# Patient Record
Sex: Male | Born: 1960 | Race: Black or African American | Hispanic: No | Marital: Single
Health system: Southern US, Community
[De-identification: ages and names within clinical notes are randomized; demographics above are authoritative.]

## PROBLEM LIST (undated history)

## (undated) DIAGNOSIS — C801 Malignant (primary) neoplasm, unspecified: Secondary | ICD-10-CM

## (undated) DIAGNOSIS — E46 Unspecified protein-calorie malnutrition: Secondary | ICD-10-CM

## (undated) DIAGNOSIS — Z5111 Encounter for antineoplastic chemotherapy: Secondary | ICD-10-CM

## (undated) DIAGNOSIS — R509 Fever, unspecified: Secondary | ICD-10-CM

## (undated) DIAGNOSIS — F17200 Nicotine dependence, unspecified, uncomplicated: Secondary | ICD-10-CM

## (undated) DIAGNOSIS — IMO0001 Reserved for inherently not codable concepts without codable children: Secondary | ICD-10-CM

## (undated) DIAGNOSIS — IMO0002 Reserved for concepts with insufficient information to code with codable children: Secondary | ICD-10-CM

## (undated) HISTORY — DX: Reserved for inherently not codable concepts without codable children: IMO0001

## (undated) HISTORY — DX: Nicotine dependence, unspecified, uncomplicated: F17.200

## (undated) HISTORY — DX: Encounter for antineoplastic chemotherapy: Z51.11

## (undated) HISTORY — DX: Reserved for concepts with insufficient information to code with codable children: IMO0002

## (undated) HISTORY — DX: Fever, unspecified: R50.9

## (undated) HISTORY — DX: Unspecified protein-calorie malnutrition: E46

---

## 2003-05-18 ENCOUNTER — Emergency Department (HOSPITAL_COMMUNITY): Admission: EM | Admit: 2003-05-18 | Discharge: 2003-05-18 | Payer: Self-pay | Admitting: Emergency Medicine

## 2005-10-07 ENCOUNTER — Emergency Department (HOSPITAL_COMMUNITY): Admission: EM | Admit: 2005-10-07 | Discharge: 2005-10-07 | Payer: Self-pay | Admitting: Emergency Medicine

## 2015-09-08 ENCOUNTER — Encounter (HOSPITAL_COMMUNITY): Payer: Self-pay | Admitting: *Deleted

## 2015-09-08 ENCOUNTER — Emergency Department (HOSPITAL_COMMUNITY): Payer: BLUE CROSS/BLUE SHIELD

## 2015-09-08 ENCOUNTER — Inpatient Hospital Stay (HOSPITAL_COMMUNITY)
Admission: EM | Admit: 2015-09-08 | Discharge: 2015-09-19 | DRG: 166 | Disposition: A | Discharge status: Home / Self Care | Payer: BLUE CROSS/BLUE SHIELD | Attending: Internal Medicine | Admitting: Internal Medicine

## 2015-09-08 DIAGNOSIS — Z809 Family history of malignant neoplasm, unspecified: Secondary | ICD-10-CM | POA: Diagnosis not present

## 2015-09-08 DIAGNOSIS — Z681 Body mass index (BMI) 19 or less, adult: Secondary | ICD-10-CM | POA: Diagnosis not present

## 2015-09-08 DIAGNOSIS — R05 Cough: Secondary | ICD-10-CM | POA: Diagnosis present

## 2015-09-08 DIAGNOSIS — R918 Other nonspecific abnormal finding of lung field: Secondary | ICD-10-CM | POA: Diagnosis present

## 2015-09-08 DIAGNOSIS — F1010 Alcohol abuse, uncomplicated: Secondary | ICD-10-CM | POA: Diagnosis not present

## 2015-09-08 DIAGNOSIS — J189 Pneumonia, unspecified organism: Secondary | ICD-10-CM | POA: Diagnosis not present

## 2015-09-08 DIAGNOSIS — E222 Syndrome of inappropriate secretion of antidiuretic hormone: Secondary | ICD-10-CM | POA: Diagnosis not present

## 2015-09-08 DIAGNOSIS — E43 Unspecified severe protein-calorie malnutrition: Secondary | ICD-10-CM | POA: Diagnosis not present

## 2015-09-08 DIAGNOSIS — F1410 Cocaine abuse, uncomplicated: Secondary | ICD-10-CM | POA: Diagnosis not present

## 2015-09-08 DIAGNOSIS — E871 Hypo-osmolality and hyponatremia: Secondary | ICD-10-CM | POA: Insufficient documentation

## 2015-09-08 DIAGNOSIS — K029 Dental caries, unspecified: Secondary | ICD-10-CM | POA: Diagnosis not present

## 2015-09-08 DIAGNOSIS — F17200 Nicotine dependence, unspecified, uncomplicated: Secondary | ICD-10-CM | POA: Diagnosis not present

## 2015-09-08 DIAGNOSIS — R509 Fever, unspecified: Secondary | ICD-10-CM

## 2015-09-08 DIAGNOSIS — C3412 Malignant neoplasm of upper lobe, left bronchus or lung: Secondary | ICD-10-CM | POA: Diagnosis not present

## 2015-09-08 DIAGNOSIS — C3490 Malignant neoplasm of unspecified part of unspecified bronchus or lung: Secondary | ICD-10-CM

## 2015-09-08 LAB — BASIC METABOLIC PANEL
Anion gap: 12 (ref 5–15)
BUN: 18 mg/dL (ref 6–20)
CALCIUM: 9.1 mg/dL (ref 8.9–10.3)
CHLORIDE: 95 mmol/L — AB (ref 101–111)
CO2: 26 mmol/L (ref 22–32)
CREATININE: 1.21 mg/dL (ref 0.61–1.24)
GFR calc non Af Amer: >60 mL/min (ref >60)
Glucose, Bld: 103 mg/dL — ABNORMAL HIGH (ref 65–99)
Potassium: 4.4 mmol/L (ref 3.5–5.1)
SODIUM: 133 mmol/L — AB (ref 135–145)

## 2015-09-08 LAB — CBC
HEMATOCRIT: 43.2 % (ref 39.0–52.0)
HEMOGLOBIN: 14.9 g/dL (ref 13.0–17.0)
MCH: 30.6 pg (ref 26.0–34.0)
MCHC: 34.5 g/dL (ref 30.0–36.0)
MCV: 88.7 fL (ref 78.0–100.0)
Platelets: 338 10*3/uL (ref 150–400)
RBC: 4.87 MIL/uL (ref 4.22–5.81)
RDW: 11.9 % (ref 11.5–15.5)
WBC: 6.4 10*3/uL (ref 4.0–10.5)

## 2015-09-08 LAB — I-STAT TROPONIN, ED: TROPONIN I, POC: 0 ng/mL (ref 0.00–0.08)

## 2015-09-08 MED ORDER — IOHEXOL 300 MG/ML  SOLN
75.0000 mL | Freq: Once | INTRAMUSCULAR | Status: AC | PRN
  Administered 2015-09-08: 75 mL via INTRAVENOUS

## 2015-09-08 NOTE — ED Notes (Signed)
Pt reports cough and chest soreness associated with cough for a week. Pt denies fever at home.

## 2015-09-08 NOTE — ED Provider Notes (Signed)
CSN: [id number]     Arrival date & time 09/08/15  1541 History   First MD Initiated Contact with Patient 09/08/15 2141     Chief Complaint  Patient presents with  . Cough  . Chest Pain      HPI Patient presents emergency room with one-month history of cough and chest soreness.  Denies fever or chills.  Has also had a proximally 25 pound weight loss over the last 1-2 months.  Patient is currently an active smoker.  Patient denies any hemoptysis. History reviewed. No pertinent past medical history. History reviewed. No pertinent past surgical history. History reviewed. No pertinent family history. Social History  Substance Use Topics  . Smoking status: Current Some Day Smoker  . Smokeless tobacco: Never Used  . Alcohol Use: Yes    Review of Systems  Constitutional: Positive for unexpected weight change. Negative for fever and chills.  Respiratory: Positive for cough.       Allergies  Review of patient's allergies indicates no known allergies.  Home Medications   Prior to Admission medications   Medication Sig Start Date End Date Taking? Authorizing Provider  aspirin-sod bicarb-citric acid (ALKA-SELTZER) 325 MG TBEF tablet Take 325 mg by mouth every 6 (six) hours as needed. For cold symptoms   Yes Historical Provider, MD  dextromethorphan (DELSYM) 30 MG/5ML liquid Take 30 mg by mouth at bedtime as needed for cough.   Yes Historical Provider, MD   BP 119/82 mmHg  Pulse 93  Temp(Src) 98.9 F (37.2 C) (Oral)  Resp 38  Wt 131 lb 1.6 oz (59.467 kg)  SpO2 98% Physical Exam  Constitutional: He is oriented to person, place, and time. He appears well-developed. No distress.  HENT:  Head: Normocephalic and atraumatic.  Eyes: Pupils are equal, round, and reactive to light.  Neck: Normal range of motion.  Cardiovascular: Normal rate and intact distal pulses.   Pulmonary/Chest: No respiratory distress. He has decreased breath sounds in the left upper field and the left middle  field.  Abdominal: Normal appearance. He exhibits no distension.  Musculoskeletal: Normal range of motion.  Neurological: He is alert and oriented to person, place, and time. No cranial nerve deficit.  Skin: Skin is warm and dry. No rash noted.  Psychiatric: He has a normal mood and affect. His behavior is normal.  Nursing note and vitals reviewed.   ED Course  Procedures (including critical care time) Labs Review Labs Reviewed  BASIC METABOLIC PANEL - Abnormal; Notable for the following:    Sodium 133 (*)    Chloride 95 (*)    Glucose, Bld 103 (*)    All other components within normal limits  CBC  I-STAT TROPOININ, ED    Imaging Review Dg Chest 2 View  09/08/2015  CLINICAL DATA:  55 year old with cough and chest pain for 1 week. Smoker. EXAM: CHEST  2 VIEW COMPARISON:  None. FINDINGS: There is complete left upper lobe collapse with obscuration of the aortic arch and mediastinal shift to the left. The left lower lobe appears clear. The right lung is clear. There is no pleural effusion or pneumothorax. No foreign bodies are seen. The bones appear unremarkable. IMPRESSION: Complete left upper lobe collapse, worrisome for underlying bronchogenic carcinoma in a smoker. Chest CT with contrast recommended for further evaluation. Electronically Signed   By: Richardean Sale M.D.   On: 09/08/2015 17:21   Ct Chest W Contrast  09/09/2015  CLINICAL DATA:  Chronic cough and chest tenderness. Initial encounter. EXAM: CT  CHEST WITH CONTRAST TECHNIQUE: Multidetector CT imaging of the chest was performed during intravenous contrast administration. CONTRAST:  28m OMNIPAQUE IOHEXOL 300 MG/ML  SOLN COMPARISON:  Chest radiograph performed earlier today at 5:23 p.m. FINDINGS: There is complete collapse of the left upper lobe, as previously noted. A lobulated contour is noted extending into the left mainstem bronchus, raising suspicion for an underlying mass measuring approximately 3.2 x 1.5 cm at the left  hilum. This may reflect a bronchogenic or endobronchial lesion. No pleural effusion or pneumothorax is seen. No pulmonary nodules are identified within the expanded portions of both lungs. The mediastinum is normal in size. 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 There appears to be an enlarged 1.4 cm periaortic node. No pericardial effusion is identified. The visualized portions of thyroid gland are unremarkable. No axillary lymphadenopathy is seen. The visualized portions of the liver are grossly unremarkable. The heterogeneous appearance of the spleen is nonspecific, without a dominant mass. The visualized portions of the gallbladder, pancreas and adrenal glands are within normal limits. The visualized portions of the kidneys are unremarkable appearance. No acute osseous abnormalities are identified. IMPRESSION: 1. Suspect mass at the left hilum, extending into the left mainstem bronchus, likely measuring approximately 3.2 x 1.5 cm, with associated complete collapse of the left upper lobe. This may reflect a bronchogenic malignancy, or an endobronchial lesion. 2. Enlarged 1.4 cm periaortic node noted. This is concerning for metastatic disease. 3. 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 Electronically Signed   By: JGarald BaldingM.D.   On: 09/09/2015 00:20   I have personally reviewed and evaluated these images and lab results as part of my medical decision-making.   EKG Interpretation   Date/Time:  Thursday September 08 2015 16:26:09 EST Ventricular Rate:  100 PR Interval:  128 QRS Duration: 94 QT Interval:  346 QTC Calculation: 446 R Axis:   96 Text Interpretation:  Normal sinus rhythm Right atrial enlargement  Rightward axis Left ventricular hypertrophy Abnormal ECG No previous  tracing Confirmed by Xylah Early  MD, [place] ([id number] on 09/08/2015 9:50:01 PM      MDM   Final diagnoses:  Malignant neoplasm of upper lobe of left lung (HCC)        RLeonard Schwartz MD 09/09/15  [phone]

## 2015-09-09 ENCOUNTER — Encounter (HOSPITAL_COMMUNITY): Payer: Self-pay | Admitting: Internal Medicine

## 2015-09-09 ENCOUNTER — Encounter (HOSPITAL_COMMUNITY)
Admission: EM | Disposition: A | Discharge status: Home / Self Care | Payer: Self-pay | Source: Home / Self Care | Attending: Internal Medicine

## 2015-09-09 ENCOUNTER — Observation Stay (HOSPITAL_COMMUNITY): Payer: BLUE CROSS/BLUE SHIELD

## 2015-09-09 DIAGNOSIS — R918 Other nonspecific abnormal finding of lung field: Secondary | ICD-10-CM | POA: Diagnosis present

## 2015-09-09 DIAGNOSIS — C3412 Malignant neoplasm of upper lobe, left bronchus or lung: Secondary | ICD-10-CM | POA: Diagnosis not present

## 2015-09-09 HISTORY — PX: VIDEO BRONCHOSCOPY: SHX5072

## 2015-09-09 LAB — CBC WITH DIFFERENTIAL/PLATELET
Basophils Absolute: 0 10*3/uL (ref 0.0–0.1)
Basophils Relative: 0 %
EOS PCT: 0 %
Eosinophils Absolute: 0 10*3/uL (ref 0.0–0.7)
HEMATOCRIT: 39.8 % (ref 39.0–52.0)
HEMOGLOBIN: 13.6 g/dL (ref 13.0–17.0)
LYMPHS ABS: 1.8 10*3/uL (ref 0.7–4.0)
LYMPHS PCT: 21 %
MCH: 30.6 pg (ref 26.0–34.0)
MCHC: 34.2 g/dL (ref 30.0–36.0)
MCV: 89.4 fL (ref 78.0–100.0)
Monocytes Absolute: 0.9 10*3/uL (ref 0.1–1.0)
Monocytes Relative: 10 %
NEUTROS ABS: 5.9 10*3/uL (ref 1.7–7.7)
NEUTROS PCT: 69 %
Platelets: 313 10*3/uL (ref 150–400)
RBC: 4.45 MIL/uL (ref 4.22–5.81)
RDW: 12 % (ref 11.5–15.5)
WBC: 8.6 10*3/uL (ref 4.0–10.5)

## 2015-09-09 LAB — COMPREHENSIVE METABOLIC PANEL
ALT: 22 U/L (ref 17–63)
AST: 29 U/L (ref 15–41)
Albumin: 2.8 g/dL — ABNORMAL LOW (ref 3.5–5.0)
Alkaline Phosphatase: 100 U/L (ref 38–126)
Anion gap: 11 (ref 5–15)
BUN: 20 mg/dL (ref 6–20)
CHLORIDE: 94 mmol/L — AB (ref 101–111)
CO2: 25 mmol/L (ref 22–32)
Calcium: 8.6 mg/dL — ABNORMAL LOW (ref 8.9–10.3)
Creatinine, Ser: 1.07 mg/dL (ref 0.61–1.24)
GFR calc Af Amer: >60 mL/min (ref >60)
Glucose, Bld: 90 mg/dL (ref 65–99)
POTASSIUM: 4.2 mmol/L (ref 3.5–5.1)
Sodium: 130 mmol/L — ABNORMAL LOW (ref 135–145)
Total Bilirubin: 0.5 mg/dL (ref 0.3–1.2)
Total Protein: 7.6 g/dL (ref 6.5–8.1)

## 2015-09-09 LAB — PROTIME-INR
INR: 1.14 (ref 0.00–1.49)
Prothrombin Time: 14.8 seconds (ref 11.6–15.2)

## 2015-09-09 LAB — APTT: aPTT: 35 seconds (ref 24–37)

## 2015-09-09 SURGERY — VIDEO BRONCHOSCOPY WITHOUT FLUORO
Anesthesia: Moderate Sedation | Laterality: Bilateral

## 2015-09-09 MED ORDER — ENSURE ENLIVE PO LIQD
237.0000 mL | Freq: Two times a day (BID) | ORAL | Status: DC
  Administered 2015-09-11 – 2015-09-12 (×2): 237 mL via ORAL

## 2015-09-09 MED ORDER — ONDANSETRON HCL 4 MG/2ML IJ SOLN: 4.0000 mg | Freq: Four times a day (QID) | INTRAMUSCULAR | Status: DC | PRN

## 2015-09-09 MED ORDER — VITAMIN B-1 100 MG PO TABS
100.0000 mg | ORAL_TABLET | Freq: Every day | ORAL | Status: DC
  Administered 2015-09-11 – 2015-09-18 (×8): 100 mg via ORAL
  Filled 2015-09-09 (×8): qty 1

## 2015-09-09 MED ORDER — ADULT MULTIVITAMIN W/MINERALS CH
1.0000 | ORAL_TABLET | Freq: Every day | ORAL | Status: DC
  Administered 2015-09-11 – 2015-09-18 (×8): 1 via ORAL
  Filled 2015-09-09 (×9): qty 1

## 2015-09-09 MED ORDER — LORAZEPAM 2 MG/ML IJ SOLN: 1.0000 mg | Freq: Four times a day (QID) | INTRAMUSCULAR | Status: AC | PRN

## 2015-09-09 MED ORDER — PHENYLEPHRINE HCL 0.25 % NA SOLN: 1.0000 | Freq: Four times a day (QID) | NASAL | Status: DC | PRN

## 2015-09-09 MED ORDER — SODIUM CHLORIDE 0.9 % IV SOLN
INTRAVENOUS | Status: DC
Start: 2015-09-09 — End: 2015-09-11
  Administered 2015-09-09: 14:00:00 via INTRAVENOUS
  Administered 2015-09-10: 1 mL via INTRAVENOUS

## 2015-09-09 MED ORDER — LIDOCAINE HCL (PF) 1 % IJ SOLN
INTRAMUSCULAR | Status: DC | PRN
  Administered 2015-09-09: 6 mL

## 2015-09-09 MED ORDER — MIDAZOLAM HCL 10 MG/2ML IJ SOLN
INTRAMUSCULAR | Status: DC | PRN
  Administered 2015-09-09: 2 mg via INTRAVENOUS
  Administered 2015-09-09: 1 mg via INTRAVENOUS
  Administered 2015-09-09: 2 mg via INTRAVENOUS

## 2015-09-09 MED ORDER — FENTANYL CITRATE (PF) 100 MCG/2ML IJ SOLN
INTRAMUSCULAR | Status: AC
  Filled 2015-09-09: qty 4

## 2015-09-09 MED ORDER — ONDANSETRON HCL 4 MG PO TABS: 4.0000 mg | ORAL_TABLET | Freq: Four times a day (QID) | ORAL | Status: DC | PRN

## 2015-09-09 MED ORDER — THIAMINE HCL 100 MG/ML IJ SOLN
100.0000 mg | Freq: Every day | INTRAMUSCULAR | Status: DC
  Administered 2015-09-09: 100 mg via INTRAVENOUS
  Filled 2015-09-09 (×2): qty 2

## 2015-09-09 MED ORDER — FENTANYL CITRATE (PF) 100 MCG/2ML IJ SOLN
INTRAMUSCULAR | Status: DC | PRN
  Administered 2015-09-09 (×2): 50 ug via INTRAVENOUS

## 2015-09-09 MED ORDER — LIDOCAINE HCL 2 % EX GEL: 1.0000 "application " | Freq: Once | CUTANEOUS | Status: DC

## 2015-09-09 MED ORDER — MIDAZOLAM HCL 5 MG/ML IJ SOLN
INTRAMUSCULAR | Status: AC
  Filled 2015-09-09: qty 2

## 2015-09-09 MED ORDER — LORAZEPAM 1 MG PO TABS: 1.0000 mg | ORAL_TABLET | Freq: Four times a day (QID) | ORAL | Status: AC | PRN

## 2015-09-09 MED ORDER — PNEUMOCOCCAL VAC POLYVALENT 25 MCG/0.5ML IJ INJ
0.5000 mL | INJECTION | INTRAMUSCULAR | Status: DC
  Filled 2015-09-09: qty 0.5

## 2015-09-09 MED ORDER — ALBUTEROL SULFATE (2.5 MG/3ML) 0.083% IN NEBU: 2.5000 mg | INHALATION_SOLUTION | Freq: Four times a day (QID) | RESPIRATORY_TRACT | Status: AC | PRN

## 2015-09-09 MED ORDER — SODIUM CHLORIDE 0.9 % IV SOLN
INTRAVENOUS | Status: DC
  Administered 2015-09-09: 19:00:00 via INTRAVENOUS

## 2015-09-09 MED ORDER — INFLUENZA VAC SPLIT QUAD 0.5 ML IM SUSY
0.5000 mL | PREFILLED_SYRINGE | INTRAMUSCULAR | Status: DC
  Filled 2015-09-09 (×2): qty 0.5

## 2015-09-09 MED ORDER — ONDANSETRON HCL 4 MG/2ML IJ SOLN: 4.0000 mg | Freq: Three times a day (TID) | INTRAMUSCULAR | Status: AC | PRN

## 2015-09-09 MED ORDER — ACETAMINOPHEN 325 MG PO TABS
650.0000 mg | ORAL_TABLET | Freq: Four times a day (QID) | ORAL | Status: DC | PRN
  Administered 2015-09-11 – 2015-09-14 (×5): 650 mg via ORAL
  Filled 2015-09-09 (×5): qty 2

## 2015-09-09 MED ORDER — LEVALBUTEROL HCL 0.63 MG/3ML IN NEBU
0.6300 mg | INHALATION_SOLUTION | Freq: Four times a day (QID) | RESPIRATORY_TRACT | Status: DC | PRN
  Filled 2015-09-09: qty 3

## 2015-09-09 MED ORDER — ACETAMINOPHEN 650 MG RE SUPP: 650.0000 mg | Freq: Four times a day (QID) | RECTAL | Status: DC | PRN

## 2015-09-09 MED ORDER — FOLIC ACID 1 MG PO TABS
1.0000 mg | ORAL_TABLET | Freq: Every day | ORAL | Status: DC
  Administered 2015-09-11 – 2015-09-18 (×8): 1 mg via ORAL
  Filled 2015-09-09 (×9): qty 1

## 2015-09-09 MED ORDER — BUTAMBEN-TETRACAINE-BENZOCAINE 2-2-14 % EX AERO: 1.0000 | INHALATION_SPRAY | Freq: Once | CUTANEOUS | Status: DC

## 2015-09-09 NOTE — Progress Notes (Signed)
Fax confirmation received for 5 pages of in network pcp for bcbs sent to pt and ED RN Cecille Rubin to fax [phone] Becky to give to pt or ED RN

## 2015-09-09 NOTE — ED Notes (Signed)
Pt. Called his work and notified them that he will not be discharged

## 2015-09-09 NOTE — Progress Notes (Signed)
Video bronchoscopy performed.  Intervention bronchial biopsy.  No complications noted.  Will continue to monitor. 

## 2015-09-09 NOTE — Procedures (Signed)
Bronchoscopy Procedure Note Maurice Mcknight [id number] 1961/03/10  Procedure: Bronchoscopy Indications: 55 yo male with left upper lung endobronchial lesion  Procedure Details Consent: Risks of procedure as well as the alternatives and risks of each were explained to the (patient/caregiver).  Consent for procedure obtained. Time Out: Verified patient identification, verified procedure, site/side was marked, verified correct patient position, special equipment/implants available, medications/allergies/relevent history reviewed, required imaging and test results available.  Performed  He was brought to endoscopy suite.  Given cetacaine spray for topical anesthesia.  Conscious sedation time from 215 pm to 230 pm.  Given 5 mg versed, 100 mcg fentanyl.  Instilled total of 6 ml of 1% lidocaine for topical anesthesia.  Bronchoscope entered orally.  Vocal cords visualized with normal motion.  Bronchoscope entered into trachea and carina visualized.  Entered right main bronchus.  Right upper, middle, and lower lobes visualized.  There were no endobronchial lesions.  Left main bronchus visualized.  There was mass in left main bronchus eminating from left upper lobe bronchus.  Mass had shiny, white appearance.  Performed endobronchial biopsy from lesion x 3.  Minimal bleeding that resolved after washing with saline.    No other immediate complications.  Patient returned to recovery in stable condition.  Disposition Will send endobronchial biopsies for surgical pathology  Chesley Mires, MD [place] 09/09/2015, 2:49 PM Pager:  [phone] After 3pm call: [phone]

## 2015-09-09 NOTE — Progress Notes (Addendum)
ED CM consulted by [facility] ED SW for a [employer] pcp for pt Entered in d/c instructions  0949 CM called [facility] ED spoke with Unit secretary Jacqlyn Larsen and ED RN Margarita Grizzle to attempt to speak with pt   0954 [facility] ED CM noted pt with coverage but no pcp listed Spoke with pt who confirms no pcp [facility] ED CM spoke with pt on how to obtain an in network pcp with insurance coverage via the customer service number or web site  Cm reviewed ED level of care for crisis/emergent services and community pcp level of care to manage continuous or chronic medical concerns.  The pt voiced understanding CM encouraged pt and discussed pt's responsibility to verify with pt's insurance carrier that any recommended medical provider offered by any emergency room or a hospital provider is within the carrier's network. The pt voiced understanding  Pt states his card is in his room CM discussed faxing him a list of doctors to follow up with He agreed to list

## 2015-09-09 NOTE — Progress Notes (Signed)
Progress note  H&P reviewed. I interviewed and examined patient in detail while he was still in the ED waiting for a room. Patient gives history of tobacco, cocaine and alcohol abuse. He presented to the ED because of persistent cough without hemoptysis. He denies dyspnea. States that he has lost 30 pounds over the last several months. Patient was seen eating breakfast this morning. He appeared comfortable and in no distress. Vital signs were stable. Reduced breath sounds on right base.  Case was discussed with PCCM who were at that time to decide inpatient versus outpatient evaluation. Since then pulmonology have consulted and due to concern that if patient goes home, he will not follow up, Hospital management was continued. He underwent fiberoptic bronchoscopy which showed mass in left main bronchus emanating from left upper lobe bronchus. Biopsies were taken.  Vernell Leep, MD, FACP, FHM. [facility] Pager [phone]  If 7PM-7AM, please contact night-coverage [email or web address] Password [email or web address] 09/09/2015, 7:01 PM

## 2015-09-09 NOTE — Consult Note (Signed)
Name: Maurice Mcknight MRN: [id number] DOB: 1961/03/22    ADMISSION DATE:  09/08/2015 CONSULTATION DATE: 2/24  REFERRING MD :  Algis Liming   CHIEF COMPLAINT:  Lung mass   BRIEF PATIENT DESCRIPTION:   55 y.o. male with history of tobacco abuse presents to the ER on 2/23  because of persistent productive cough over the last 1 month. Patient states patient also has been having increasing weight loss estimated 30 lbs in last 6 mo (unintentional). Patient has some chest discomfort only on coughing. CT chest in the ER showed lung mass and since patient has no follow-up patient has been admitted for further management of the lung mass. Denied any hemoptysis. PCCM asked to see to a/w evaluation.     SIGNIFICANT EVENTS    STUDIES:  CT chest 2/24:1. Suspect mass at the left hilum, extending into the left mainstem bronchus, likely measuring approximately 3.2 x 1.5 cm, with associated complete collapse of the left upper lobe. This may reflect a bronchogenic malignancy, or an endobronchial lesion. 2. Enlarged 1.4 cm periaortic node noted. This is concerning for metastatic disease.   HISTORY OF PRESENT ILLNESS:   See above   PAST MEDICAL HISTORY :   has no past medical history on file.  has no past surgical history on file. Prior to Admission medications   Medication Sig Start Date End Date Taking? Authorizing Provider  aspirin-sod bicarb-citric acid (ALKA-SELTZER) 325 MG TBEF tablet Take 325 mg by mouth every 6 (six) hours as needed. For cold symptoms   Yes Historical Provider, MD  dextromethorphan (DELSYM) 30 MG/5ML liquid Take 30 mg by mouth at bedtime as needed for cough.   Yes Historical Provider, MD   No Known Allergies  FAMILY HISTORY:  family history includes Cancer in his sister. SOCIAL HISTORY:  reports that he has been smoking (estimated >1ppd/"years").  He has never used smokeless tobacco. He reports that he drinks alcohol. He reports that he does use illicit drugs; but not for  weeks.  REVIEW OF SYSTEMS:   Constitutional: Negative for fever, chills, weight loss, malaise/fatigue and diaphoresis.  HENT: Negative for hearing loss, ear pain, nosebleeds, congestion, sore throat, neck pain, tinnitus and ear discharge.   Eyes: Negative for blurred vision, double vision, photophobia, pain, discharge and redness.  Respiratory: + cough, hemoptysis, sputum production, shortness of breath, wheezing and stridor. + cp LUL chest w/ cough   Cardiovascular: Negative for chest pain, palpitations, orthopnea, claudication, leg swelling and PND.  Gastrointestinal: Negative for heartburn, nausea, vomiting, abdominal pain, diarrhea, constipation, blood in stool and melena.  Genitourinary: Negative for dysuria, urgency, frequency, hematuria and flank pain.  Musculoskeletal: Negative for myalgias, back pain, joint pain and falls.  Skin: Negative for itching and rash.  Neurological: Negative for dizziness, tingling, tremors, sensory change, speech change, focal weakness, seizures, loss of consciousness, weakness and headaches.  Endo/Heme/Allergies: Negative for environmental allergies and polydipsia. Does not bruise/bleed easily.  SUBJECTIVE:  No distress  VITAL SIGNS: Temp:  [98.9 F (37.2 C)-99 F (37.2 C)] 99 F (37.2 C) (02/24 0346) Pulse Rate:  [74-102] 74 (02/24 0346) Resp:  [17-38] 20 (02/24 0346) BP: (109-140)/(71-93) 109/79 mmHg (02/24 0346) SpO2:  [96 %-99 %] 99 % (02/24 0346) Weight:  [131 lb 1.6 oz (59.467 kg)] 131 lb 1.6 oz (59.467 kg) (02/23 1626)  PHYSICAL EXAMINATION: General:  Lying in bed, no distress.  Neuro:  Awake, alert, no focal def  HEENT:  Temporal wasting. MM are moist, edentulous, no JVD  Cardiovascular:  Rrr, no MRG Lungs:  Ronchi/decreased LUL, otherwise clear, no accessory muscle use  Abdomen:  Soft, not tender, + bowel sounds Musculoskeletal:  Equal st and bulk Skin:  Warm, brisk CR, 2 + pulses    Recent Labs Lab 09/08/15 1634 09/09/15 0403    NA 133* 130*  K 4.4 4.2  CL 95* 94*  CO2 26 25  BUN 18 20  CREATININE 1.21 1.07  GLUCOSE 103* 90    Recent Labs Lab 09/08/15 1634 09/09/15 0403  HGB 14.9 13.6  HCT 43.2 39.8  WBC 6.4 8.6  PLT 338 313   Dg Chest 2 View  09/08/2015  CLINICAL DATA:  55 year old with cough and chest pain for 1 week. Smoker. EXAM: CHEST  2 VIEW COMPARISON:  None. FINDINGS: There is complete left upper lobe collapse with obscuration of the aortic arch and mediastinal shift to the left. The left lower lobe appears clear. The right lung is clear. There is no pleural effusion or pneumothorax. No foreign bodies are seen. The bones appear unremarkable. IMPRESSION: Complete left upper lobe collapse, worrisome for underlying bronchogenic carcinoma in a smoker. Chest CT with contrast recommended for further evaluation. Electronically Signed   By: Richardean Sale M.D.   On: 09/08/2015 17:21   Ct Chest W Contrast  09/09/2015  CLINICAL DATA:  Chronic cough and chest tenderness. Initial encounter. EXAM: CT CHEST WITH CONTRAST TECHNIQUE: Multidetector CT imaging of the chest was performed during intravenous contrast administration. CONTRAST:  89m OMNIPAQUE IOHEXOL 300 MG/ML  SOLN COMPARISON:  Chest radiograph performed earlier today at 5:23 p.m. FINDINGS: There is complete collapse of the left upper lobe, as previously noted. A lobulated contour is noted extending into the left mainstem bronchus, raising suspicion for an underlying mass measuring approximately 3.2 x 1.5 cm at the left hilum. This may reflect a bronchogenic or endobronchial lesion. No pleural effusion or pneumothorax is seen. No pulmonary nodules are identified within the expanded portions of both lungs. The mediastinum is normal in size. 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 There appears to be an enlarged 1.4 cm periaortic node. No pericardial effusion is identified. The visualized portions of thyroid gland are unremarkable. No axillary  lymphadenopathy is seen. The visualized portions of the liver are grossly unremarkable. The heterogeneous appearance of the spleen is nonspecific, without a dominant mass. The visualized portions of the gallbladder, pancreas and adrenal glands are within normal limits. The visualized portions of the kidneys are unremarkable appearance. No acute osseous abnormalities are identified. IMPRESSION: 1. Suspect mass at the left hilum, extending into the left mainstem bronchus, likely measuring approximately 3.2 x 1.5 cm, with associated complete collapse of the left upper lobe. This may reflect a bronchogenic malignancy, or an endobronchial lesion. 2. Enlarged 1.4 cm periaortic node noted. This is concerning for metastatic disease. 3. 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 Electronically Signed   By: JGarald BaldingM.D.   On: 09/09/2015 00:20    ASSESSMENT / PLAN:  LUL Lung mass w/ endobronchial obstruction-->almost certainly a malignancy Post obstructive atelectasis Cough   Plan FOB for bx today   PErick ColaceACNP-BC LFrancisPager # [phone] # [phone] no answer   09/09/2015, 8:37 AM  STAFF NOTE: I, DMerrie Roof MD FACP have personally reviewed patient's available data, including medical history, events of note, physical examination and test results as part of my evaluation. I have discussed with resident/NP and other care providers such as pharmacist, RN and  RRT. In addition, I personally evaluated patient and elicited key findings of: fully examined, no discrete lymph nodes on exam,, no hemoptysis noted, reduced BS left apical, CT reviewed hilar mass, concern if to go home will NOT follow up well, this is amendable to bedside bronch and BX, will plan this afternoon, assess coags, will assess pt need to stay in hospital post procedure, , does not appear to need ebus with location to bronchus, cbc noted, follow for WD  Lavon Paganini. Titus Mould, MD,  [place] Pgr: [facility] 09/09/2015 11:17 AM

## 2015-09-09 NOTE — H&P (Signed)
[facility] History and Physical  Maurice Mcknight DDU:[id number] DOB: 1961/02/11 DOA: 09/08/2015  Referring physician: Dr. Audie Pinto. PCP: No primary care provider on file.  Specialists: None.  Chief Complaint: Cough.  HPI: Maurice Mcknight is a 55 y.o. male with history of tobacco abuse presents to the ER because of persistent productive cough over the last 1 month. Patient states patient also has been having increasing weight loss. Patient has some chest discomfort only on coughing. CT chest in the ER shows lung mass and since patient has no follow-up patient has been admitted for further management of the lung mass. Denies any hemoptysis.   Review of Systems: As presented in the history of presenting illness, rest negative.  History reviewed. No pertinent past medical history. History reviewed. No pertinent past surgical history. Social History:  reports that he has been smoking.  He has never used smokeless tobacco. He reports that he drinks alcohol. He reports that he does not use illicit drugs. Where does patient live home. Can patient participate in ADLs? Yes.  No Known Allergies  Family History:  Family History  Problem Relation Age of Onset  . Cancer Sister       Prior to Admission medications   Medication Sig Start Date End Date Taking? Authorizing Provider  aspirin-sod bicarb-citric acid (ALKA-SELTZER) 325 MG TBEF tablet Take 325 mg by mouth every 6 (six) hours as needed. For cold symptoms   Yes Historical Provider, MD  dextromethorphan (DELSYM) 30 MG/5ML liquid Take 30 mg by mouth at bedtime as needed for cough.   Yes Historical Provider, MD    Physical Exam: Filed Vitals:   09/08/15 2300 09/08/15 2315 09/08/15 2330 09/08/15 2345  BP: 127/93  119/82   Pulse: 92 100 93 93  Temp:      TempSrc:      Resp: 23 35 30 38  Weight:      SpO2: 98% 98% 97% 98%     General:  Moderately built and nourished.  Eyes: Anicteric no pallor.  ENT: No discharge from the  ears eyes nose or mouth.  Neck: No mass felt. No JVD appreciated.  Cardiovascular: S1 and S2 heard.  Respiratory: No rhonchi or crepitations.  Abdomen: Soft nontender bowel sounds present.  Skin: No rash.  Musculoskeletal: No edema.  Psychiatric: Appears normal.  Neurologic: Alert awake oriented to time place and person. Moves all extremities.  Labs on Admission:  Basic Metabolic Panel:  Recent Labs Lab 09/08/15 1634  NA 133*  K 4.4  CL 95*  CO2 26  GLUCOSE 103*  BUN 18  CREATININE 1.21  CALCIUM 9.1   Liver Function Tests: No results for input(s): AST, ALT, ALKPHOS, BILITOT, PROT, ALBUMIN in the last 168 hours. No results for input(s): LIPASE, AMYLASE in the last 168 hours. No results for input(s): AMMONIA in the last 168 hours. CBC:  Recent Labs Lab 09/08/15 1634  WBC 6.4  HGB 14.9  HCT 43.2  MCV 88.7  PLT 338   Cardiac Enzymes: No results for input(s): CKTOTAL, CKMB, CKMBINDEX, TROPONINI in the last 168 hours.  BNP (last 3 results) No results for input(s): BNP in the last 8760 hours.  ProBNP (last 3 results) No results for input(s): PROBNP in the last 8760 hours.  CBG: No results for input(s): GLUCAP in the last 168 hours.  Radiological Exams on Admission: Dg Chest 2 View  09/08/2015  CLINICAL DATA:  55 year old with cough and chest pain for 1 week. Smoker. EXAM: CHEST  2  VIEW COMPARISON:  None. FINDINGS: There is complete left upper lobe collapse with obscuration of the aortic arch and mediastinal shift to the left. The left lower lobe appears clear. The right lung is clear. There is no pleural effusion or pneumothorax. No foreign bodies are seen. The bones appear unremarkable. IMPRESSION: Complete left upper lobe collapse, worrisome for underlying bronchogenic carcinoma in a smoker. Chest CT with contrast recommended for further evaluation. Electronically Signed   By: Richardean Sale M.D.   On: 09/08/2015 17:21   Ct Chest W Contrast  09/09/2015   CLINICAL DATA:  Chronic cough and chest tenderness. Initial encounter. EXAM: CT CHEST WITH CONTRAST TECHNIQUE: Multidetector CT imaging of the chest was performed during intravenous contrast administration. CONTRAST:  14m OMNIPAQUE IOHEXOL 300 MG/ML  SOLN COMPARISON:  Chest radiograph performed earlier today at 5:23 p.m. FINDINGS: There is complete collapse of the left upper lobe, as previously noted. A lobulated contour is noted extending into the left mainstem bronchus, raising suspicion for an underlying mass measuring approximately 3.2 x 1.5 cm at the left hilum. This may reflect a bronchogenic or endobronchial lesion. No pleural effusion or pneumothorax is seen. No pulmonary nodules are identified within the expanded portions of both lungs. The mediastinum is normal in size. 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 There appears to be an enlarged 1.4 cm periaortic node. No pericardial effusion is identified. The visualized portions of thyroid gland are unremarkable. No axillary lymphadenopathy is seen. The visualized portions of the liver are grossly unremarkable. The heterogeneous appearance of the spleen is nonspecific, without a dominant mass. The visualized portions of the gallbladder, pancreas and adrenal glands are within normal limits. The visualized portions of the kidneys are unremarkable appearance. No acute osseous abnormalities are identified. IMPRESSION: 1. Suspect mass at the left hilum, extending into the left mainstem bronchus, likely measuring approximately 3.2 x 1.5 cm, with associated complete collapse of the left upper lobe. This may reflect a bronchogenic malignancy, or an endobronchial lesion. 2. Enlarged 1.4 cm periaortic node noted. This is concerning for metastatic disease. 3. 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 Electronically Signed   By: JGarald BaldingM.D.   On: 09/09/2015 00:20    EKG: Independently reviewed. Sinus tachycardia with  LVH.  Assessment/Plan Principal Problem:   Lung mass   1. Lung mass concerning for malignancy - I have discussed with on-call pulmonary critical care Dr.Deterding, who will be seeing patient in consult for possible bronchoscopy. Further recommendation based on pulmonary consult. 2. Tobacco abuse - patient advised about quitting tobacco.   DVT Prophylaxis SCDs in anticipation of procedure.  Code Status: Full code.  Family Communication: Discussed with patient.  Disposition Plan: Admit for observation.    Blaine Hari N. [facility] Pager [phone]  If 7PM-7AM, please contact night-coverage [email or web address] Password [email or web address]2/24/2017, 12:39 AM

## 2015-09-09 NOTE — ED Notes (Signed)
Patient will be having test done no lunch was ordered

## 2015-09-10 NOTE — Progress Notes (Signed)
Initial Nutrition Assessment  DOCUMENTATION CODES:   Severe malnutrition in context of chronic illness, Underweight  INTERVENTION:   Continue Regular diet  NUTRITION DIAGNOSIS:   Increased nutrient needs related to catabolic illness as evidenced by estimated needs  GOAL:   Patient will meet greater than or equal to 90% of their needs  MONITOR:   PO intake, Supplement acceptance, Labs, Weight trends, I & O's  REASON FOR ASSESSMENT:   Malnutrition Screening Tool  ASSESSMENT:   55 y.o. Male with history of tobacco abuse presents to the ER on 2/23 because of persistent productive cough over the last 1 month. Patient states patient also has been having increasing weight loss estimated 30 lbs in last 6 mo (unintentional). Patient has some chest discomfort only on coughing. CT chest in the ER showed lung mass and since patient has no follow-up patient has been admitted for further management of the lung mass.  Patient reports a decreased appetite PTA. Was consuming about 1 meal per day. Endorses a 30 lb weight loss in the past "few" months. Does not know time frame of wt loss because he "wasn't paying attention to that". PO intake 50% per flowsheet records. Meets criteria for severe malnutrition.  Pt would benefit from oral nutrition supplements, however, declined.  RD unable to complete Nutrition Focused Physical Exam at this time.  Pt covered entirely with blanket.  Diet Order:  Diet regular Room service appropriate?: Yes; Fluid consistency:: Thin  Skin:  Reviewed, no issues  Last BM:  2/22  Height:   Ht Readings from Last 1 Encounters:  09/09/15 '6\' 1"'$  (1.854 m)    Weight:   Wt Readings from Last 1 Encounters:  09/09/15 131 lb (59.421 kg)    Ideal Body Weight:  84 kg  BMI:  Body mass index is 17.29 kg/(m^2).  Estimated Nutritional Needs:   Kcal:  1800-2000  Protein:  90-100 gm  Fluid:  1.8-1.0 L  EDUCATION NEEDS:   No education needs identified at  this time  Arthur Holms, RD, LDN Pager #: [phone] After-Hours Pager #: [phone]

## 2015-09-10 NOTE — Progress Notes (Signed)
Maurice Mcknight [phone] RN

## 2015-09-11 ENCOUNTER — Observation Stay (HOSPITAL_COMMUNITY): Payer: BLUE CROSS/BLUE SHIELD

## 2015-09-11 DIAGNOSIS — E43 Unspecified severe protein-calorie malnutrition: Secondary | ICD-10-CM | POA: Diagnosis not present

## 2015-09-11 DIAGNOSIS — C3412 Malignant neoplasm of upper lobe, left bronchus or lung: Secondary | ICD-10-CM | POA: Diagnosis not present

## 2015-09-11 DIAGNOSIS — J189 Pneumonia, unspecified organism: Secondary | ICD-10-CM | POA: Diagnosis not present

## 2015-09-11 DIAGNOSIS — F1910 Other psychoactive substance abuse, uncomplicated: Secondary | ICD-10-CM | POA: Diagnosis not present

## 2015-09-11 DIAGNOSIS — E871 Hypo-osmolality and hyponatremia: Secondary | ICD-10-CM | POA: Diagnosis not present

## 2015-09-11 DIAGNOSIS — E222 Syndrome of inappropriate secretion of antidiuretic hormone: Secondary | ICD-10-CM | POA: Diagnosis not present

## 2015-09-11 LAB — BASIC METABOLIC PANEL
ANION GAP: 9 (ref 5–15)
BUN: 14 mg/dL (ref 6–20)
CHLORIDE: 96 mmol/L — AB (ref 101–111)
CO2: 27 mmol/L (ref 22–32)
Calcium: 8.4 mg/dL — ABNORMAL LOW (ref 8.9–10.3)
Creatinine, Ser: 1.02 mg/dL (ref 0.61–1.24)
GFR calc non Af Amer: >60 mL/min (ref >60)
Glucose, Bld: 123 mg/dL — ABNORMAL HIGH (ref 65–99)
POTASSIUM: 4.1 mmol/L (ref 3.5–5.1)
SODIUM: 132 mmol/L — AB (ref 135–145)

## 2015-09-11 LAB — URINALYSIS, ROUTINE W REFLEX MICROSCOPIC
BILIRUBIN URINE: NEGATIVE
Glucose, UA: NEGATIVE mg/dL
HGB URINE DIPSTICK: NEGATIVE
KETONES UR: NEGATIVE mg/dL
Leukocytes, UA: NEGATIVE
NITRITE: NEGATIVE
PH: 7.5 (ref 5.0–8.0)
Protein, ur: NEGATIVE mg/dL
SPECIFIC GRAVITY, URINE: 1.023 (ref 1.005–1.030)

## 2015-09-11 LAB — LACTIC ACID, PLASMA: LACTIC ACID, VENOUS: 1.2 mmol/L (ref 0.5–2.0)

## 2015-09-11 LAB — CBC
HCT: 38 % — ABNORMAL LOW (ref 39.0–52.0)
Hemoglobin: 12.9 g/dL — ABNORMAL LOW (ref 13.0–17.0)
MCH: 29.7 pg (ref 26.0–34.0)
MCHC: 33.9 g/dL (ref 30.0–36.0)
MCV: 87.6 fL (ref 78.0–100.0)
PLATELETS: 348 10*3/uL (ref 150–400)
RBC: 4.34 MIL/uL (ref 4.22–5.81)
RDW: 11.7 % (ref 11.5–15.5)
WBC: 6.7 10*3/uL (ref 4.0–10.5)

## 2015-09-11 MED ORDER — GUAIFENESIN-DM 100-10 MG/5ML PO SYRP
5.0000 mL | ORAL_SOLUTION | ORAL | Status: DC | PRN
  Administered 2015-09-11 – 2015-09-19 (×7): 5 mL via ORAL
  Filled 2015-09-11 (×9): qty 5

## 2015-09-11 NOTE — Progress Notes (Signed)
I had seen this patient in the ED on 09/09/15. I had changed the attending name from admitting M.D. to mine. Sometime after that, my name as attending was discontinued and so was [place] rounding team assignment. Patient's nursing team also did not alert me that he had not been seen. Thereby patient got dropped off my list and inadvertently, he was not seen on 09/10/15.  Vernell Leep, MD, FACP, FHM. [facility] Pager [phone]  If 7PM-7AM, please contact night-coverage [email or web address] Password [facility] 09/11/2015, 3:05 PM

## 2015-09-11 NOTE — Progress Notes (Signed)
PROGRESS NOTE    Kiara Keep Dewoody  ZWC:[id number]  DOB: Apr 02, 1961  DOA: 09/08/2015 PCP: No primary care provider on file. Outpatient Specialists:   Hospital course: 55 year old male patient with history of polysubstance abuse-tobacco, cocaine & alcohol, presented to [facility] ED on 09/08/15 with 1 month history of persistent cough productive of white sputum and profound weight loss of 25-30 pounds over several months. In the ED CT chest showed lung mass suspicious for bronchogenic carcinoma. Pulmonology was consulted and patient underwent fiberoptic bronchoscopy that confirmed mass in left main bronchus emanating from left upper lobe bronchus and biopsies were taken. Pathology is pending.   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  - Pulmonology was consulted and patient underwent fiberoptic bronchoscopy that confirmed mass in left main bronchus emanating from left upper lobe bronchus and biopsies were taken.  - Pathology is pending. - Had a temperature of 101.6 early this morning but no clear clinical source of infection. For now continue to monitor. If he has further fevers, will need workup.  Polysubstance abuse: Tobacco, cocaine & alcohol - Cessation counseled. Patient declines nicotine patch. Placed on CIWA protocol. No overt withdrawal.  Severe malnutrition in the context of chronic illness, underweight - Dietitian input appreciated. Continue regular diet and nutritional supplements.  Hyponatremia - Clinically appears euvolemic. May be SIADH related to lung mass. Follow BMP in a.m. DC IV fluids.  DVT prophylaxis: SCDs Code Status: Full Family Communication: None at bedside Disposition Plan: Pending results of pathology results   Consultants:  Pulmonology  Procedures:  Fiberoptic bronchoscopy and biopsy by CCM on 2/24  Antimicrobials:  None   Subjective: Cough with  intermittent white sputum. Denies hemoptysis or chest pain. Does not like hospital food.  Objective: Filed Vitals:   09/10/15 2035 09/11/15 0447 09/11/15 0620 09/11/15 1306  BP: 110/70 110/66  104/76  Pulse: 81 87  79  Temp: 100 F (37.8 C) 101.6 F (38.7 C) 98.5 F (36.9 C) 98.7 F (37.1 C)  TempSrc: Oral Oral Oral Oral  Resp: '19 19  17  '$ Height:      Weight:      SpO2: 100% 100%  100%    Intake/Output Summary (Last 24 hours) at 09/11/15 1508 Last data filed at 09/11/15 1431  Gross per 24 hour  Intake    702 ml  Output      0 ml  Net    702 ml   Filed Weights   09/08/15 1626 09/09/15 1320  Weight: 59.467 kg (131 lb 1.6 oz) 59.421 kg (131 lb)    Exam:  General exam: Moderately built and frail middle-aged male lying comfortably supine in bed. Does not look septic or toxic. Respiratory system: Slightly diminished breath sounds in the left lung fields otherwise clear to auscultation. No increased work of breathing. Cardiovascular system: S1 & S2 heard, RRR. No JVD, murmurs, gallops, clicks or pedal edema. Gastrointestinal system: Abdomen is nondistended, soft and nontender. Normal bowel sounds heard. Central nervous system: Alert and oriented. No focal neurological deficits. Extremities: Symmetric 5 x 5 power.   Data Reviewed: Basic Metabolic Panel:  Recent Labs Lab 09/08/15 1634 09/09/15 0403  NA 133* 130*  K 4.4 4.2  CL 95* 94*  CO2 26 25  GLUCOSE 103* 90  BUN 18 20  CREATININE 1.21 1.07  CALCIUM 9.1 8.6*   Liver Function Tests:  Recent Labs Lab 09/09/15 0403  AST 29  ALT 22  ALKPHOS 100  BILITOT 0.5  PROT 7.6  ALBUMIN 2.8*   No results for input(s): LIPASE, AMYLASE in the last 168 hours. No results for input(s): AMMONIA in the last 168 hours. CBC:  Recent Labs Lab 09/08/15 1634 09/09/15 0403  WBC 6.4 8.6  NEUTROABS  --  5.9  HGB 14.9 13.6  HCT 43.2 39.8  MCV 88.7 89.4  PLT 338 313   Cardiac Enzymes: No results for input(s): CKTOTAL,  CKMB, CKMBINDEX, TROPONINI in the last 168 hours. BNP (last 3 results) No results for input(s): PROBNP in the last 8760 hours. CBG: No results for input(s): GLUCAP in the last 168 hours.  No results found for this or any previous visit (from the past 240 hour(s)).       Studies: No results found.      Scheduled Meds: . feeding supplement (ENSURE ENLIVE)  237 mL Oral BID BM  . folic acid  1 mg Oral Daily  . Influenza vac split quadrivalent PF  0.5 mL Intramuscular Tomorrow-1000  . multivitamin with minerals  1 tablet Oral Daily  . pneumococcal 23 valent vaccine  0.5 mL Intramuscular Tomorrow-1000  . thiamine  100 mg Oral Daily   Or  . thiamine  100 mg Intravenous Daily   Continuous Infusions: . sodium chloride Stopped (09/10/15 1500)    Principal Problem:   Lung mass Active Problems:   Malignant neoplasm of upper lobe of left lung ([place])    Time spent: 25 minutes.    Vernell Leep, MD, FACP, FHM. [facility] Pager [phone]  If 7PM-7AM, please contact night-coverage [email or web address] Password [facility] 09/11/2015, 3:08 PM

## 2015-09-12 ENCOUNTER — Telehealth: Payer: Self-pay | Admitting: *Deleted

## 2015-09-12 ENCOUNTER — Encounter: Payer: Self-pay | Admitting: *Deleted

## 2015-09-12 ENCOUNTER — Encounter (HOSPITAL_COMMUNITY): Payer: Self-pay | Admitting: Pulmonary Disease

## 2015-09-12 DIAGNOSIS — R634 Abnormal weight loss: Secondary | ICD-10-CM | POA: Diagnosis not present

## 2015-09-12 DIAGNOSIS — R0600 Dyspnea, unspecified: Secondary | ICD-10-CM | POA: Diagnosis not present

## 2015-09-12 DIAGNOSIS — F1410 Cocaine abuse, uncomplicated: Secondary | ICD-10-CM | POA: Diagnosis present

## 2015-09-12 DIAGNOSIS — J189 Pneumonia, unspecified organism: Secondary | ICD-10-CM | POA: Diagnosis not present

## 2015-09-12 DIAGNOSIS — C3492 Malignant neoplasm of unspecified part of left bronchus or lung: Secondary | ICD-10-CM

## 2015-09-12 DIAGNOSIS — F17200 Nicotine dependence, unspecified, uncomplicated: Secondary | ICD-10-CM | POA: Diagnosis present

## 2015-09-12 DIAGNOSIS — Z681 Body mass index (BMI) 19 or less, adult: Secondary | ICD-10-CM | POA: Diagnosis not present

## 2015-09-12 DIAGNOSIS — R918 Other nonspecific abnormal finding of lung field: Secondary | ICD-10-CM | POA: Diagnosis not present

## 2015-09-12 DIAGNOSIS — Z809 Family history of malignant neoplasm, unspecified: Secondary | ICD-10-CM | POA: Diagnosis not present

## 2015-09-12 DIAGNOSIS — K029 Dental caries, unspecified: Secondary | ICD-10-CM | POA: Diagnosis present

## 2015-09-12 DIAGNOSIS — C3412 Malignant neoplasm of upper lobe, left bronchus or lung: Secondary | ICD-10-CM

## 2015-09-12 DIAGNOSIS — E871 Hypo-osmolality and hyponatremia: Secondary | ICD-10-CM | POA: Insufficient documentation

## 2015-09-12 DIAGNOSIS — F1010 Alcohol abuse, uncomplicated: Secondary | ICD-10-CM | POA: Diagnosis present

## 2015-09-12 DIAGNOSIS — E43 Unspecified severe protein-calorie malnutrition: Secondary | ICD-10-CM | POA: Diagnosis present

## 2015-09-12 DIAGNOSIS — R05 Cough: Secondary | ICD-10-CM | POA: Diagnosis present

## 2015-09-12 DIAGNOSIS — E222 Syndrome of inappropriate secretion of antidiuretic hormone: Secondary | ICD-10-CM | POA: Diagnosis not present

## 2015-09-12 LAB — BASIC METABOLIC PANEL
Anion gap: 8 (ref 5–15)
BUN: 12 mg/dL (ref 6–20)
CALCIUM: 8.1 mg/dL — AB (ref 8.9–10.3)
CO2: 26 mmol/L (ref 22–32)
CREATININE: 0.98 mg/dL (ref 0.61–1.24)
Chloride: 98 mmol/L — ABNORMAL LOW (ref 101–111)
GFR calc Af Amer: >60 mL/min (ref >60)
GLUCOSE: 97 mg/dL (ref 65–99)
Potassium: 3.8 mmol/L (ref 3.5–5.1)
SODIUM: 132 mmol/L — AB (ref 135–145)

## 2015-09-12 LAB — LACTIC ACID, PLASMA: LACTIC ACID, VENOUS: 0.9 mmol/L (ref 0.5–2.0)

## 2015-09-12 LAB — INFLUENZA PANEL BY PCR (TYPE A & B)
H1N1 flu by pcr: NOT DETECTED
INFLBPCR: NEGATIVE
Influenza A By PCR: NEGATIVE

## 2015-09-12 LAB — URINE CULTURE

## 2015-09-12 LAB — OSMOLALITY, URINE: Osmolality, Ur: 863 mOsm/kg (ref 300–900)

## 2015-09-12 LAB — OSMOLALITY: Osmolality: 276 mOsm/kg (ref 275–295)

## 2015-09-12 MED ORDER — PIPERACILLIN-TAZOBACTAM 3.375 G IVPB
3.3750 g | Freq: Three times a day (TID) | INTRAVENOUS | Status: DC
  Administered 2015-09-12 – 2015-09-18 (×17): 3.375 g via INTRAVENOUS
  Filled 2015-09-12 (×21): qty 50

## 2015-09-12 MED ORDER — VANCOMYCIN HCL IN DEXTROSE 750-5 MG/150ML-% IV SOLN
750.0000 mg | Freq: Two times a day (BID) | INTRAVENOUS | Status: DC
  Administered 2015-09-12 – 2015-09-15 (×7): 750 mg via INTRAVENOUS
  Filled 2015-09-12 (×9): qty 150

## 2015-09-12 MED ORDER — PIPERACILLIN-TAZOBACTAM 3.375 G IVPB 30 MIN
3.3750 g | Freq: Once | INTRAVENOUS | Status: AC
  Administered 2015-09-12: 3.375 g via INTRAVENOUS
  Filled 2015-09-12: qty 50

## 2015-09-12 NOTE — Progress Notes (Signed)
Pharmacy Antibiotic Note  Maurice Mcknight is a 55 y.o. male admitted on 09/08/2015 with post-obstructive PNA.  Pharmacy has been consulted for Vancocin and Zosyn dosing.  Plan: Vancomycin 750 IV every 12 hours.  Goal trough 15-20 mcg/mL. Zosyn 3.375g IV q8h (4 hour infusion).  Height: '6\' 1"'$  (185.4 cm) Weight: 131 lb (59.421 kg) IBW/kg (Calculated) : 79.9  Temp (24hrs), Avg:100.2 F (37.9 C), Min:98.7 F (37.1 C), Max:102.8 F (39.3 C)   Recent Labs Lab 09/08/15 1634 09/09/15 0403 09/11/15 2134 09/11/15 2149 09/12/15 0007 09/12/15 0536  WBC 6.4 8.6  --  6.7  --   --   CREATININE 1.21 1.07  --  1.02  --  0.98  LATICACIDVEN  --   --  1.2  --  0.9  --     Estimated Creatinine Clearance: 70.7 mL/min (by C-G formula based on Cr of 0.98).    No Known Allergies   Microbiology results: 2/26 BCx IP 2/26 UCx IP   Thank you for allowing pharmacy to be a part of this patient's care.  Wynona Neat, PharmD, BCPS  09/12/2015 7:41 AM

## 2015-09-12 NOTE — Progress Notes (Signed)
Oncology Nurse Navigator Documentation  Oncology Nurse Navigator Flowsheets 09/12/2015  Treatment Phase Abnormal Scans/I received a referral on Mr. Burgoon today.  Will schedule for MTOC once discharged   Barriers/Navigation Needs Coordination of Care  Interventions Coordination of Care  Coordination of Care Appts  Acuity Level 1  Time Spent with Patient 15

## 2015-09-12 NOTE — Progress Notes (Signed)
Patient oral temp=103.2 and refused Tylenol or any medication including the Vancomycin IVPB. Patient stated, "I refused any treatment until I talked to my doctor tomorrow".  Patient's refusal for any treatment was documented earlier and floor coverage made aware.

## 2015-09-12 NOTE — Progress Notes (Signed)
Floor coverage K. Baltazar Najjar called back instructing this RN to document refusal of treatment and will just wait for tomorrow for patient's doctor to talk to him.  Patient just refused to go to MRI until patient's talk to his doctor tomorrow.

## 2015-09-12 NOTE — Progress Notes (Signed)
Pt. Very angry knowing of newly diagnosed left lung cancer thru phone from Dr. Algis Liming.  Pulled out the hanging Vanco IVPB and refused tx from anybody until he talks to the doctor.  [facility] Floor coverage Tylene Fantasia paged for info and further disposition.  Will monitor.  Charge RN made aware.  Awaiting response from floor coverage [facility].

## 2015-09-12 NOTE — Progress Notes (Signed)
Oncology Nurse Navigator Documentation  Oncology Nurse Navigator Flowsheets 09/12/2015  Treatment Phase Abnormal Scans/[name] received a call from attending regarding referral on Mr. Waybright.  [name] stated he would see patient on 09/16/15 arrive at 9:00.  Attending will update patient.    Barriers/Navigation Needs Coordination of Care  Interventions -  Coordination of Care Appts  Acuity Level 1  Time Spent with Patient 15

## 2015-09-12 NOTE — Progress Notes (Signed)
PROGRESS NOTE    Maurice Mcknight  GNF:[id number]  DOB: Nov 16, 1960  DOA: 09/08/2015 PCP: No primary care provider on file. Outpatient Specialists:   Hospital course: 55 year old male patient with history of polysubstance abuse-tobacco, cocaine & alcohol, presented to [facility] ED on 09/08/15 with 1 month history of persistent cough productive of white sputum and profound weight loss of 25-30 pounds over several months. In the ED CT chest showed lung mass suspicious for bronchogenic carcinoma. Pulmonology was consulted and patient underwent fiberoptic bronchoscopy that confirmed mass in left main bronchus emanating from left upper lobe bronchus and biopsies were taken. Pathology shows squamous cell carcinoma.   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  - Pulmonology was consulted and patient underwent fiberoptic bronchoscopy that confirmed mass in left main bronchus emanating from left upper lobe bronchus and biopsies were taken.  - Pathology confirms squamous cell carcinoma - Discussed with Dr. Elsworth Soho, Pulmonology: arranging [facility] referral. Will need OP PET scan - 1.4 cm periaortic LN noted on CT - Discussed with Dr. Curt Bears, Oncologist on call who has arranged outpatient follow-up on Friday 09/16/15-patient has to come at 9 AM for labs and 9:15 AM for M.D. visit. Patient states that he has no transport to get their-case management consulted for resources. [name] also recommended MRI brain with and without contrast-will order  Postobstructive pneumonia - Patient spiking temperatures for the last 2 days. - Started empiric IV vancomycin and Zosyn pending culture results. - Influenza panel PCR negative. Urine microscopy: Negative. Chest x-ray shows left upper lobe collapse, postobstructive pneumonia - Hopefully can transition to oral antibiotics in the next 24-48  hours  Polysubstance abuse: Tobacco, cocaine & alcohol - Cessation counseled. Patient declines nicotine patch. Placed on CIWA protocol. No overt withdrawal.  Severe malnutrition in the context of chronic illness, underweight - Dietitian input appreciated. Continue regular diet and nutritional supplements.  Hyponatremia - Clinically appears euvolemic. May be SIADH related to lung mass. Follow BMP in a.m. DC IV fluids. Stable.  Dental caries - Outpatient follow-up.  DVT prophylaxis: SCDs Code Status: Full Family Communication: None at bedside Disposition Plan: Hopefully in the next 48 hours.   Consultants:  Pulmonology  Procedures:  Fiberoptic bronchoscopy and biopsy by CCM on 2/24  Antimicrobials:  IV Zosyn 2/27 >  IV vancomycin 2/27 >  Pathology results: Diagnosis Endobronchial biopsy, left mainstem bronchus POSITIVE FOR SQUAMOUS CELL CARCINOMA.  Subjective: Cough with intermittent white sputum. Fevers overnight.  Objective: Filed Vitals:   09/11/15 2045 09/11/15 2144 09/11/15 2300 09/12/15 0417  BP: 101/75 135/73  105/66  Pulse: 79 84 82 78  Temp: 102.8 F (39.3 C) 99.4 F (37.4 C)  99.8 F (37.7 C)  TempSrc: Oral Oral  Oral  Resp: '19 19  19  '$ Height:      Weight:      SpO2: 100% 100%  100%    Intake/Output Summary (Last 24 hours) at 09/12/15 0739 Last data filed at 09/11/15 1900  Gross per 24 hour  Intake    684 ml  Output      0 ml  Net    684 ml   Filed Weights   09/08/15 1626 09/09/15 1320  Weight: 59.467 kg (131 lb 1.6 oz) 59.421 kg (131 lb)    Exam:  General exam: Moderately built and frail middle-aged male lying comfortably supine in bed. Does not look  septic or toxic. Respiratory system: Slightly diminished breath sounds in the left lung fields otherwise clear to auscultation. No increased work of breathing. Cardiovascular system: S1 & S2 heard, RRR. No JVD, murmurs, gallops, clicks or pedal edema. Gastrointestinal system: Abdomen is  nondistended, soft and nontender. Normal bowel sounds heard. Central nervous system: Alert and oriented. No focal neurological deficits. Extremities: Symmetric 5 x 5 power.   Data Reviewed: Basic Metabolic Panel:  Recent Labs Lab 09/08/15 1634 09/09/15 0403 09/11/15 2149 09/12/15 0536  NA 133* 130* 132* 132*  K 4.4 4.2 4.1 3.8  CL 95* 94* 96* 98*  CO2 '26 25 27 26  '$ GLUCOSE 103* 90 123* 97  BUN '18 20 14 12  '$ CREATININE 1.21 1.07 1.02 0.98  CALCIUM 9.1 8.6* 8.4* 8.1*   Liver Function Tests:  Recent Labs Lab 09/09/15 0403  AST 29  ALT 22  ALKPHOS 100  BILITOT 0.5  PROT 7.6  ALBUMIN 2.8*   No results for input(s): LIPASE, AMYLASE in the last 168 hours. No results for input(s): AMMONIA in the last 168 hours. CBC:  Recent Labs Lab 09/08/15 1634 09/09/15 0403 09/11/15 2149  WBC 6.4 8.6 6.7  NEUTROABS  --  5.9  --   HGB 14.9 13.6 12.9*  HCT 43.2 39.8 38.0*  MCV 88.7 89.4 87.6  PLT 338 313 348   Cardiac Enzymes: No results for input(s): CKTOTAL, CKMB, CKMBINDEX, TROPONINI in the last 168 hours. BNP (last 3 results) No results for input(s): PROBNP in the last 8760 hours. CBG: No results for input(s): GLUCAP in the last 168 hours.  No results found for this or any previous visit (from the past 240 hour(s)).       Studies: Dg Chest Port 1 View  09/12/2015  CLINICAL DATA:  Acute onset of fever and shortness of breath. Initial encounter. EXAM: PORTABLE CHEST 1 VIEW COMPARISON:  Chest radiograph and CT of the chest performed 09/08/2015 FINDINGS: Left upper lobe collapse is again noted, somewhat more dense than on the prior study. As before, this appeared to reflect a mass at the left hilum on the prior CT, with associated postobstructive pneumonia. No pleural effusion or pneumothorax is seen. The cardiomediastinal silhouette is within normal limits. No acute osseous abnormalities are seen. IMPRESSION: Left upper lobe collapse again noted. This reflects the apparent  mass at the left hilum as noted on recent prior CT, with associated postobstructive pneumonia. Electronically Signed   By: Garald Balding M.D.   On: 09/12/2015 01:46        Scheduled Meds: . feeding supplement (ENSURE ENLIVE)  237 mL Oral BID BM  . folic acid  1 mg Oral Daily  . Influenza vac split quadrivalent PF  0.5 mL Intramuscular Tomorrow-1000  . multivitamin with minerals  1 tablet Oral Daily  . pneumococcal 23 valent vaccine  0.5 mL Intramuscular Tomorrow-1000  . thiamine  100 mg Oral Daily   Or  . thiamine  100 mg Intravenous Daily   Continuous Infusions:    Principal Problem:   Lung mass Active Problems:   Malignant neoplasm of upper lobe of left lung ([place])    Time spent: 35 minutes.    Vernell Leep, MD, FACP, FHM. [facility] Pager [phone]  If 7PM-7AM, please contact night-coverage [email or web address] Password [facility] 09/12/2015, 7:39 AM

## 2015-09-12 NOTE — Progress Notes (Signed)
Name: Maurice Mcknight MRN: [id number] DOB: Nov 29, 1960    ADMISSION DATE:  09/08/2015 CONSULTATION DATE: 2/24  REFERRING MD :  Algis Liming   CHIEF COMPLAINT:  Lung mass   BRIEF PATIENT DESCRIPTION:   55 y.o. male with history of tobacco abuse presents to the ER on 2/23  because of persistent productive cough over the last 1 month. Patient states patient also has been having increasing weight loss estimated 30 lbs in last 6 mo (unintentional). Patient has some chest discomfort only on coughing. CT chest in the ER showed lung mass and since patient has no follow-up patient has been admitted for further management of the lung mass. Denied any hemoptysis. PCCM asked to see to a/w evaluation.     SIGNIFICANT EVENTS  2/24 bscopy >>mass in left main bronchus eminating from left upper lobe bronchus. Mass had shiny, white appearance.  STUDIES:  CT chest 2/24:1. Suspect mass at the left hilum, extending into the left mainstem bronchus, likely measuring approximately 3.2 x 1.5 cm, with associated complete collapse of the left upper lobe. This may reflect a bronchogenic malignancy, or an endobronchial lesion. 2. Enlarged 1.4 cm periaortic node noted. This is concerning for metastatic disease.    SUBJECTIVE:  No distress  No CP, dyspnea  VITAL SIGNS: Temp:  [98.7 F (37.1 C)-102.8 F (39.3 C)] 99.8 F (37.7 C) (02/27 0417) Pulse Rate:  [78-84] 78 (02/27 0417) Resp:  [17-19] 19 (02/27 0417) BP: (101-135)/(66-76) 105/66 mmHg (02/27 0417) SpO2:  [100 %] 100 % (02/27 0417)  PHYSICAL EXAMINATION: General:  Lying in bed, no distress.  Neuro:  Awake, alert, no focal def  HEENT:  Temporal wasting. MM are moist, edentulous, no JVD  Cardiovascular:  Rrr, no MRG Lungs:  Ronchi/decreased LUL, otherwise clear, no accessory muscle use  Abdomen:  Soft, not tender, + bowel sounds Musculoskeletal:  Equal st and bulk Skin:  Warm, brisk CR, 2 + pulses    Recent Labs Lab 09/09/15 0403  09/11/15 2149 09/12/15 0536  NA 130* 132* 132*  K 4.2 4.1 3.8  CL 94* 96* 98*  CO2 '25 27 26  '$ BUN '20 14 12  '$ CREATININE 1.07 1.02 0.98  GLUCOSE 90 123* 97    Recent Labs Lab 09/08/15 1634 09/09/15 0403 09/11/15 2149  HGB 14.9 13.6 12.9*  HCT 43.2 39.8 38.0*  WBC 6.4 8.6 6.7  PLT 338 313 348   Dg Chest Port 1 View  09/12/2015  CLINICAL DATA:  Acute onset of fever and shortness of breath. Initial encounter. EXAM: PORTABLE CHEST 1 VIEW COMPARISON:  Chest radiograph and CT of the chest performed 09/08/2015 FINDINGS: Left upper lobe collapse is again noted, somewhat more dense than on the prior study. As before, this appeared to reflect a mass at the left hilum on the prior CT, with associated postobstructive pneumonia. No pleural effusion or pneumothorax is seen. The cardiomediastinal silhouette is within normal limits. No acute osseous abnormalities are seen. IMPRESSION: Left upper lobe collapse again noted. This reflects the apparent mass at the left hilum as noted on recent prior CT, with associated postobstructive pneumonia. Electronically Signed   By: Garald Balding M.D.   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 FCCP. [facility] Pager [phone] If no response call [phone]    09/12/2015 1:05 PM

## 2015-09-13 ENCOUNTER — Inpatient Hospital Stay (HOSPITAL_COMMUNITY): Payer: BLUE CROSS/BLUE SHIELD

## 2015-09-13 DIAGNOSIS — E222 Syndrome of inappropriate secretion of antidiuretic hormone: Secondary | ICD-10-CM

## 2015-09-13 MED ORDER — GADOBENATE DIMEGLUMINE 529 MG/ML IV SOLN
12.0000 mL | Freq: Once | INTRAVENOUS | Status: AC | PRN
  Administered 2015-09-13: 12 mL via INTRAVENOUS

## 2015-09-13 NOTE — Progress Notes (Signed)
Addendum  Overnight events noted. Patient was angry because his cancer diagnosis was allegedly informed to him via phone call & not in person. I interviewed and examined patient today along with 6N floor Soil scientist. I discussed extensively with patient and advised him that I first told him about his cancer diagnosis on Sun 09/11/15, based upon FOB results at which time he had stated " don't say that, don't say that" and did not wish to discuss. As per my discussion with Dr. Elsworth Soho, [place], he discussed patient's cancer diagnosis with patient in person on 09/12/15. Following this I discussed extensively via phone with patient regarding confirmed cancer diagnosis and further steps i.e. outpatient oncology follow-up appointment made, outpatient workup including PET scan, inpatient MRI brain as recommended by oncologist, case management consultation to assist with outpatient resources for him to go to [facility] etc. Patient became verbally agitated and abusive (using the F... word) and kept repeating that he was not informed of his cancer diagnosis in person which was not true. Summarized his hospitalization, workup done thus far, management and future plans. He kept saying, I want this "taken out" indicating his cancer. Repeatedly advised him that there were no plans to perform any form of surgery in the hospital at this time. He is undergoing workup to determine if he will be a surgical candidate at a future point. He seemed agreeable to getting MRI brain.  Vernell Leep, MD, FACP, FHM. [facility] Pager [phone]  If 7PM-7AM, please contact night-coverage [email or web address] Password [facility] 09/13/2015, 5:04 PM

## 2015-09-13 NOTE — Progress Notes (Signed)
PROGRESS NOTE    Decari Duggar Stofko  LEX:[id number]  DOB: [birth year]  DOA: 09/08/2015 PCP: No primary care provider on file. Outpatient Specialists:   Hospital course: 55 year old male patient with history of polysubstance abuse-tobacco, cocaine & alcohol, presented to [facility] ED on 09/08/15 with 1 month history of persistent cough productive of white sputum and profound weight loss of 25-30 pounds over several months. In the ED CT chest showed lung mass suspicious for bronchogenic carcinoma. Pulmonology was consulted and patient underwent fiberoptic bronchoscopy that confirmed mass in left main bronchus emanating from left upper lobe bronchus and biopsies were taken. Pathology shows squamous cell carcinoma.   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  - Pulmonology was consulted and patient underwent fiberoptic bronchoscopy that confirmed mass in left main bronchus emanating from left upper lobe bronchus and biopsies were taken.  - Pathology confirms squamous cell carcinoma - Discussed with Dr. Elsworth Soho, Pulmonology: arranging [place] referral. Will need OP PET scan - 1.4 cm periaortic LN noted on CT - Discussed with Dr. Curt Bears, Oncologist on call on 2/27 who has arranged outpatient follow-up on Friday 09/16/15-patient has to come at 9 AM for labs and 9:15 AM for M.D. visit. Patient states that he has no transport to get their-case management consulted for resources. [name] also recommended MRI brain with and without contrast - ordered. If for some reason, patient's stay in the hospital is extended and he cannot make it to the oncologist appointment, this has to be communicated with the oncologist and date has to be changed/postponed.  Postobstructive pneumonia - Patient spiking temperatures - Started empiric IV vancomycin and Zosyn pending culture results. -  Influenza panel PCR negative. Urine microscopy: Negative. Chest x-ray shows left upper lobe collapse, postobstructive pneumonia - Hopefully can transition to oral antibiotics in the next 24 hours  Polysubstance abuse: Tobacco, cocaine & alcohol - Cessation counseled. Patient declines nicotine patch. Placed on CIWA protocol. No overt withdrawal.  Severe malnutrition in the context of chronic illness, underweight - Dietitian input appreciated. Continue regular diet and nutritional supplements.  Hyponatremia/SIADH - Clinically appears euvolemic.  - Urine osmolarity 863, serum osmolarity 276. Fluid restriction.  Dental caries - Outpatient follow-up.  DVT prophylaxis: SCDs Code Status: Full Family Communication: None at bedside Disposition Plan: Hopefully DC home in the next 24 hours and outpatient follow-up with oncology.   Consultants:  Pulmonology  Procedures:  Fiberoptic bronchoscopy and biopsy by CCM on 2/24  Antimicrobials:  IV Zosyn 2/27 >  IV vancomycin 2/27 >  Pathology results: Diagnosis Endobronchial biopsy, left mainstem bronchus POSITIVE FOR SQUAMOUS CELL CARCINOMA.  Subjective: No new complaints reported. Had fever overnight.  Objective: Filed Vitals:   09/12/15 0417 09/12/15 1511 09/12/15 2237 09/13/15 0519  BP: 105/66 114/82 105/68 95/78  Pulse: 78 88 99 100  Temp: 99.8 F (37.7 C) 99.9 F (37.7 C) 103.2 F (39.6 C) 100.9 F (38.3 C)  TempSrc: Oral Oral Oral Oral  Resp: '19 17 18 18  '$ Height:      Weight:      SpO2: 100% 100% 100% 95%    Intake/Output Summary (Last 24 hours) at 09/13/15 1646 Last data filed at 09/13/15 0840  Gross per 24 hour  Intake    530 ml  Output    400 ml  Net    130 ml   Filed Weights   09/08/15 1626  09/09/15 1320  Weight: 59.467 kg (131 lb 1.6 oz) 59.421 kg (131 lb)    Exam:  General exam: Moderately built and frail middle-aged male lying comfortably supine in bed. Does not look septic or toxic. Respiratory  system: Slightly diminished breath sounds in the left lung fields otherwise clear to auscultation. No increased work of breathing. Cardiovascular system: S1 & S2 heard, RRR. No JVD, murmurs, gallops, clicks or pedal edema. Gastrointestinal system: Abdomen is nondistended, soft and nontender. Normal bowel sounds heard. Central nervous system: Alert and oriented. No focal neurological deficits. Extremities: Symmetric 5 x 5 power.   Data Reviewed: Basic Metabolic Panel:  Recent Labs Lab 09/08/15 1634 09/09/15 0403 09/11/15 2149 09/12/15 0536  NA 133* 130* 132* 132*  K 4.4 4.2 4.1 3.8  CL 95* 94* 96* 98*  CO2 '26 25 27 26  '$ GLUCOSE 103* 90 123* 97  BUN '18 20 14 12  '$ CREATININE 1.21 1.07 1.02 0.98  CALCIUM 9.1 8.6* 8.4* 8.1*   Liver Function Tests:  Recent Labs Lab 09/09/15 0403  AST 29  ALT 22  ALKPHOS 100  BILITOT 0.5  PROT 7.6  ALBUMIN 2.8*   No results for input(s): LIPASE, AMYLASE in the last 168 hours. No results for input(s): AMMONIA in the last 168 hours. CBC:  Recent Labs Lab 09/08/15 1634 09/09/15 0403 09/11/15 2149  WBC 6.4 8.6 6.7  NEUTROABS  --  5.9  --   HGB 14.9 13.6 12.9*  HCT 43.2 39.8 38.0*  MCV 88.7 89.4 87.6  PLT 338 313 348   Cardiac Enzymes: No results for input(s): CKTOTAL, CKMB, CKMBINDEX, TROPONINI in the last 168 hours. BNP (last 3 results) No results for input(s): PROBNP in the last 8760 hours. CBG: No results for input(s): GLUCAP in the last 168 hours.  Recent Results (from the past 240 hour(s))  Culture, blood (routine x 2)     Status: None (Preliminary result)   Collection Time: 09/11/15  9:35 PM  Result Value Ref Range Status   Specimen Description BLOOD LEFT ANTECUBITAL  Final   Special Requests BOTTLES DRAWN AEROBIC AND ANAEROBIC 8CC   Final   Culture NO GROWTH 2 DAYS  Final   Report Status PENDING  Incomplete  Culture, blood (routine x 2)     Status: None (Preliminary result)   Collection Time: 09/11/15  9:40 PM  Result  Value Ref Range Status   Specimen Description BLOOD RIGHT ANTECUBITAL  Final   Special Requests BOTTLES DRAWN AEROBIC AND ANAEROBIC 10CC   Final   Culture NO GROWTH 2 DAYS  Final   Report Status PENDING  Incomplete  Culture, Urine     Status: None   Collection Time: 09/11/15 10:54 PM  Result Value Ref Range Status   Specimen Description URINE, RANDOM  Final   Special Requests NONE  Final   Culture MULTIPLE SPECIES PRESENT, SUGGEST RECOLLECTION  Final   Report Status 09/12/2015 FINAL  Final         Studies: Dg Chest Port 1 View  09/12/2015  CLINICAL DATA:  Acute onset of fever and shortness of breath. Initial encounter. EXAM: PORTABLE CHEST 1 VIEW COMPARISON:  Chest radiograph and CT of the chest performed 09/08/2015 FINDINGS: Left upper lobe collapse is again noted, somewhat more dense than on the prior study. As before, this appeared to reflect a mass at the left hilum on the prior CT, with associated postobstructive pneumonia. No pleural effusion or pneumothorax is seen. The cardiomediastinal silhouette is within normal limits.  No acute osseous abnormalities are seen. IMPRESSION: Left upper lobe collapse again noted. This reflects the apparent mass at the left hilum as noted on recent prior CT, with associated postobstructive pneumonia. Electronically Signed   By: Garald Balding M.D.   On: 09/12/2015 01:46        Scheduled Meds: . feeding supplement (ENSURE ENLIVE)  237 mL Oral BID BM  . folic acid  1 mg Oral Daily  . Influenza vac split quadrivalent PF  0.5 mL Intramuscular Tomorrow-1000  . multivitamin with minerals  1 tablet Oral Daily  . piperacillin-tazobactam (ZOSYN)  IV  3.375 g Intravenous Q8H  . pneumococcal 23 valent vaccine  0.5 mL Intramuscular Tomorrow-1000  . thiamine  100 mg Oral Daily  . vancomycin  750 mg Intravenous Q12H   Continuous Infusions:    Principal Problem:   Lung mass Active Problems:   Malignant neoplasm of upper lobe of left lung (HCC)    Pneumonia   Postobstructive pneumonia   Hyponatremia    Time spent: 35 minutes.    Vernell Leep, MD, FACP, FHM. [facility] Pager [phone]  If 7PM-7AM, please contact night-coverage [email or web address] Password [facility] 09/13/2015, 4:46 PM    LOS: 1 day

## 2015-09-13 NOTE — Clinical Social Work Note (Signed)
CSW received call from [facility] regarding patient's need for bus passes and transportation resources. Patient not currently in his room. CSW left transportation resources and bus passes (two) on the patient's chart. RN made aware. CSW signing off.    Liz Beach MSW, [place], [place], [phone]

## 2015-09-13 NOTE — Progress Notes (Addendum)
Patient finally allowed this RN to hang his IV antibiotic, Zosyn and requested for Tylenol 650 mg PO for low grade fever.  Patient states, "I do not want to die man". Patient resting on bed comfortably and denies any pain.

## 2015-09-13 NOTE — Care Management Note (Signed)
Case Management Note  Patient Details  Name: Maurice Mcknight MRN: [id number] Date of Birth: 12/14/1960  Subjective/Objective:   Left upper lobe lung mass               Action/Plan: NCM spoke to pt in room at length about new diagnosis. States he lives alone and his family/friend support is very minimal. States he would not have any one to assist him with getting to his appts if he needed further treatment. NCM explained we could provide him with bus passes to get to his appt on Friday to see Oncologist. Pt states he would have to get several bus passes because he would need to transfer. Explained once he is established with physician they may could assist him with getting his temporary disability. Educated pt he would need to apply at the [place] office. Provided pt with paperwork on how to apply. Will have NCM follow up with [facility] # [phone] on 09/14/2015 for new referral. CSW referral for transportation. Pt states he works full-time but has no benefits such as short term disability. States if he is unable to work. He is not sure how he will pay his bills.  Expected Discharge Date:  09/14/2015              Expected Discharge Plan:  Home/Self Care  In-House Referral:  Clinical Social Work  Discharge planning Services  CM Consult  Post Acute Care Choice:  NA Choice offered to:  NA  DME Arranged:  N/A DME Agency:  NA  HH Arranged:  NA HH Agency:  NA  Status of Service:  Completed, signed off  Medicare Important Message Given:    Date Medicare IM Given:    Medicare IM give by:    Date Additional Medicare IM Given:    Additional Medicare Important Message give by:     If discussed at [place] of Stay Meetings, dates discussed:    Additional Comments:  Erenest Rasher, RN 09/13/2015, 5:52 PM

## 2015-09-14 LAB — BASIC METABOLIC PANEL
Anion gap: 8 (ref 5–15)
BUN: 12 mg/dL (ref 6–20)
CO2: 25 mmol/L (ref 22–32)
CREATININE: 0.99 mg/dL (ref 0.61–1.24)
Calcium: 8.3 mg/dL — ABNORMAL LOW (ref 8.9–10.3)
Chloride: 96 mmol/L — ABNORMAL LOW (ref 101–111)
Glucose, Bld: 102 mg/dL — ABNORMAL HIGH (ref 65–99)
POTASSIUM: 4.2 mmol/L (ref 3.5–5.1)
SODIUM: 129 mmol/L — AB (ref 135–145)

## 2015-09-14 LAB — CBC
HCT: 37.7 % — ABNORMAL LOW (ref 39.0–52.0)
HEMOGLOBIN: 12.6 g/dL — AB (ref 13.0–17.0)
MCH: 29.3 pg (ref 26.0–34.0)
MCHC: 33.4 g/dL (ref 30.0–36.0)
MCV: 87.7 fL (ref 78.0–100.0)
Platelets: 442 10*3/uL — ABNORMAL HIGH (ref 150–400)
RBC: 4.3 MIL/uL (ref 4.22–5.81)
RDW: 12.1 % (ref 11.5–15.5)
WBC: 10.1 10*3/uL (ref 4.0–10.5)

## 2015-09-14 NOTE — Progress Notes (Signed)
[facility] PROGRESS NOTE  Maurice Mcknight FOY:[id number] DOB: [birth year] DOA: 09/08/2015 PCP: No primary care provider on file.  HPI/Brief narrative 55 year old male patient with history of polysubstance abuse-tobacco, cocaine & alcohol, presented to [facility] ED on 09/08/15 with 1 month history of persistent cough productive of white sputum and profound weight loss of 25-30 pounds over several months. In the ED CT chest showed lung mass suspicious for bronchogenic carcinoma. Pulmonology was consulted and patient underwent fiberoptic bronchoscopy that confirmed mass in left main bronchus emanating from left upper lobe bronchus and biopsies were taken. Pathology shows squamous cell carcinoma.  Assessment/Plan: Left upper lobe lung mass with endobronchial obstruction, squamous cell carcinoma - CT chest showed lung mass suspicious for bronchogenic carcinoma.  - Pulmonology was consulted and patient underwent fiberoptic bronchoscopy that confirmed mass in left main bronchus emanating from left upper lobe bronchus and biopsies were taken.  - Pathology confirms squamous cell carcinoma - Discussed with Dr. Elsworth Soho, Pulmonology: arranging [place] referral. Will need OP PET scan - 1.4 cm periaortic LN noted on CT - Dr. Algis Liming had Discussed with Dr. Curt Bears, Oncologist on call on 2/27 who has arranged outpatient follow-up on Friday 09/16/15-patient has to come at 9 AM for labs and 9:15 AM for M.D. visit. Patient states that he has no transport to get their-case management consulted for resources. [name] also recommended MRI brain with and without contrast. MRI without signs of mets - Patient seems to be undergoing stages of acceptance of his new diagnosis. Comforted at bedside  Postobstructive pneumonia - Patient continues with spiking temperatures - For now, will continue empiric IV vancomycin and Zosyn - Influenza panel PCR negative. Urine microscopy: Negative. Chest x-ray shows left upper lobe  collapse, postobstructive pneumonia - Hopefully can transition to oral antibiotics when afebrile  Polysubstance abuse: Tobacco, cocaine & alcohol - Cessation counseled. Patient declines nicotine patch. Placed on CIWA protocol. No overt withdrawal.  Severe malnutrition in the context of chronic illness, underweight - Dietitian input appreciated. Continue regular diet and nutritional supplements.  Hyponatremia/SIADH - Clinically appears euvolemic.  - Urine osmolarity 863, serum osmolarity 276. Continue fluid restriction.  Dental caries - Outpatient follow-up.  Code Status: Full Family Communication: Pt in room Disposition Plan: Anticipate d/c when afebrile x at least 24hrs and on PO abx   Consultants:  PCCM  Procedures:  Fiberoptic bronchoscopy and biopsy by CCM on 2/24  Antibiotics: Anti-infectives    Start     Dose/Rate Route Frequency Ordered Stop   09/12/15 1400  piperacillin-tazobactam (ZOSYN) IVPB 3.375 g     3.375 g 12.5 mL/hr over 240 Minutes Intravenous Every 8 hours 09/12/15 0745     09/12/15 0800  vancomycin (VANCOCIN) IVPB 750 mg/150 ml premix     750 mg 150 mL/hr over 60 Minutes Intravenous Every 12 hours 09/12/15 0745     09/12/15 0745  piperacillin-tazobactam (ZOSYN) IVPB 3.375 g     3.375 g 100 mL/hr over 30 Minutes Intravenous  Once 09/12/15 0745 09/12/15 0914      HPI/Subjective: Claims to be accepting of new findings of lung cancer, but visibly upset about various things  Objective: Filed Vitals:   09/13/15 1415 09/13/15 2138 09/14/15 0348 09/14/15 0500  BP: 111/66 105/69 93/64   Pulse: 89 88 99   Temp: 99 F (37.2 C) 102.3 F (39.1 C) 101 F (38.3 C) 99.3 F (37.4 C)  TempSrc: Oral Oral Oral Oral  Resp: 18  15   Height:  Weight:      SpO2: 98% 98% 98%     Intake/Output Summary (Last 24 hours) at 09/14/15 1536 Last data filed at 09/14/15 9357  Gross per 24 hour  Intake    600 ml  Output      0 ml  Net    600 ml   Filed  Weights   09/08/15 1626 09/09/15 1320  Weight: 59.467 kg (131 lb 1.6 oz) 59.421 kg (131 lb)    Exam:   General:  Awake, in nad  Cardiovascular: regular, s1, s2  Respiratory: normal resp effort, no wheezing  Abdomen: soft, nondistended  Musculoskeletal: perfused, no clubbing   Data Reviewed: Basic Metabolic Panel:  Recent Labs Lab 09/08/15 1634 09/09/15 0403 09/11/15 2149 09/12/15 0536 09/14/15 0551  NA 133* 130* 132* 132* 129*  K 4.4 4.2 4.1 3.8 4.2  CL 95* 94* 96* 98* 96*  CO2 '26 25 27 26 25  '$ GLUCOSE 103* 90 123* 97 102*  BUN '18 20 14 12 12  '$ CREATININE 1.21 1.07 1.02 0.98 0.99  CALCIUM 9.1 8.6* 8.4* 8.1* 8.3*   Liver Function Tests:  Recent Labs Lab 09/09/15 0403  AST 29  ALT 22  ALKPHOS 100  BILITOT 0.5  PROT 7.6  ALBUMIN 2.8*   No results for input(s): LIPASE, AMYLASE in the last 168 hours. No results for input(s): AMMONIA in the last 168 hours. CBC:  Recent Labs Lab 09/08/15 1634 09/09/15 0403 09/11/15 2149 09/14/15 0551  WBC 6.4 8.6 6.7 10.1  NEUTROABS  --  5.9  --   --   HGB 14.9 13.6 12.9* 12.6*  HCT 43.2 39.8 38.0* 37.7*  MCV 88.7 89.4 87.6 87.7  PLT 338 313 348 442*   Cardiac Enzymes: No results for input(s): CKTOTAL, CKMB, CKMBINDEX, TROPONINI in the last 168 hours. BNP (last 3 results) No results for input(s): BNP in the last 8760 hours.  ProBNP (last 3 results) No results for input(s): PROBNP in the last 8760 hours.  CBG: No results for input(s): GLUCAP in the last 168 hours.  Recent Results (from the past 240 hour(s))  Culture, blood (routine x 2)     Status: None (Preliminary result)   Collection Time: 09/11/15  9:35 PM  Result Value Ref Range Status   Specimen Description BLOOD LEFT ANTECUBITAL  Final   Special Requests BOTTLES DRAWN AEROBIC AND ANAEROBIC 8CC   Final   Culture NO GROWTH 3 DAYS  Final   Report Status PENDING  Incomplete  Culture, blood (routine x 2)     Status: None (Preliminary result)   Collection  Time: 09/11/15  9:40 PM  Result Value Ref Range Status   Specimen Description BLOOD RIGHT ANTECUBITAL  Final   Special Requests BOTTLES DRAWN AEROBIC AND ANAEROBIC 10CC   Final   Culture NO GROWTH 3 DAYS  Final   Report Status PENDING  Incomplete  Culture, Urine     Status: None   Collection Time: 09/11/15 10:54 PM  Result Value Ref Range Status   Specimen Description URINE, RANDOM  Final   Special Requests NONE  Final   Culture MULTIPLE SPECIES PRESENT, SUGGEST RECOLLECTION  Final   Report Status 09/12/2015 FINAL  Final     Studies: Mr Kizzie Fantasia Contrast  10/06/15  CLINICAL DATA:  55 year old male with lung mass.  Initial encounter. EXAM: MRI HEAD WITHOUT AND WITH CONTRAST TECHNIQUE: Multiplanar, multiecho pulse sequences of the brain and surrounding structures were obtained without and with intravenous contrast. CONTRAST:  61m MULTIHANCE GADOBENATE DIMEGLUMINE 529 MG/ML IV SOLN COMPARISON:  None. FINDINGS: No acute infarct or intracranial hemorrhage. No intracranial enhancing lesion or bony destructive lesion noted to suggest intracranial metastatic disease. Incidentally noted are pulsation artifact and vessels extending deep into the sulci. Mild nonspecific white matter changes most consistent with result of small vessel disease. Mild parietal lobe atrophy without hydrocephalus. Major intracranial vascular structures are patent. Cervical medullary junction, pituitary region, pineal region and orbital structures unremarkable. Minimal paranasal sinus mucosal thickening. IMPRESSION: No evidence of intracranial metastatic disease. Mild small vessel disease type changes. Mild parietal lobe atrophy. Electronically Signed   By: SGenia DelM.D.   On: 09/13/2015 17:29    Scheduled Meds: . feeding supplement (ENSURE ENLIVE)  237 mL Oral BID BM  . folic acid  1 mg Oral Daily  . Influenza vac split quadrivalent PF  0.5 mL Intramuscular Tomorrow-1000  . multivitamin with minerals  1 tablet Oral  Daily  . piperacillin-tazobactam (ZOSYN)  IV  3.375 g Intravenous Q8H  . pneumococcal 23 valent vaccine  0.5 mL Intramuscular Tomorrow-1000  . thiamine  100 mg Oral Daily  . vancomycin  750 mg Intravenous Q12H   Continuous Infusions:   Principal Problem:   Lung mass Active Problems:   Malignant neoplasm of upper lobe of left lung (HCC)   Pneumonia   Postobstructive pneumonia   Hyponatremia    Tavis Kring K  [facility] Pager [phone] If 7PM-7AM, please contact night-coverage at [email or web address], password [facility]3/07/2015, 3:36 PM  LOS: 2 days

## 2015-09-15 ENCOUNTER — Inpatient Hospital Stay (HOSPITAL_COMMUNITY): Payer: BLUE CROSS/BLUE SHIELD

## 2015-09-15 DIAGNOSIS — E871 Hypo-osmolality and hyponatremia: Secondary | ICD-10-CM

## 2015-09-15 LAB — BASIC METABOLIC PANEL
ANION GAP: 7 (ref 5–15)
BUN: 13 mg/dL (ref 6–20)
CO2: 26 mmol/L (ref 22–32)
Calcium: 8.3 mg/dL — ABNORMAL LOW (ref 8.9–10.3)
Chloride: 97 mmol/L — ABNORMAL LOW (ref 101–111)
Creatinine, Ser: 1 mg/dL (ref 0.61–1.24)
GFR calc Af Amer: >60 mL/min (ref >60)
Glucose, Bld: 100 mg/dL — ABNORMAL HIGH (ref 65–99)
POTASSIUM: 3.9 mmol/L (ref 3.5–5.1)
SODIUM: 130 mmol/L — AB (ref 135–145)

## 2015-09-15 LAB — CBC
HCT: 36.4 % — ABNORMAL LOW (ref 39.0–52.0)
Hemoglobin: 12.1 g/dL — ABNORMAL LOW (ref 13.0–17.0)
MCH: 29.2 pg (ref 26.0–34.0)
MCHC: 33.2 g/dL (ref 30.0–36.0)
MCV: 87.9 fL (ref 78.0–100.0)
PLATELETS: 445 10*3/uL — AB (ref 150–400)
RBC: 4.14 MIL/uL — AB (ref 4.22–5.81)
RDW: 12.1 % (ref 11.5–15.5)
WBC: 8.1 10*3/uL (ref 4.0–10.5)

## 2015-09-15 LAB — VANCOMYCIN, TROUGH: VANCOMYCIN TR: 6 ug/mL — AB (ref 10.0–20.0)

## 2015-09-15 MED ORDER — VANCOMYCIN HCL 500 MG IV SOLR
500.0000 mg | Freq: Three times a day (TID) | INTRAVENOUS | Status: DC
  Administered 2015-09-16 – 2015-09-18 (×8): 500 mg via INTRAVENOUS
  Filled 2015-09-15 (×10): qty 500

## 2015-09-15 MED ORDER — BENZONATATE 100 MG PO CAPS
100.0000 mg | ORAL_CAPSULE | Freq: Three times a day (TID) | ORAL | Status: DC | PRN
  Administered 2015-09-15 – 2015-09-18 (×6): 100 mg via ORAL
  Filled 2015-09-15 (×7): qty 1

## 2015-09-15 NOTE — Progress Notes (Signed)
Nutrition Follow-up  DOCUMENTATION CODES:   Severe malnutrition in context of chronic illness, Underweight  INTERVENTION:   -Continue with regular diet and encourage intake of meals -Continue MVI  NUTRITION DIAGNOSIS:   Increased nutrient needs related to catabolic illness as evidenced by estimated needs.  Ongoing  GOAL:   Patient will meet greater than or equal to 90% of their needs  Progressing  MONITOR:   PO intake, Supplement acceptance, Labs, Weight trends, I & O's  REASON FOR ASSESSMENT:   Malnutrition Screening Tool    ASSESSMENT:   55 y.o. Male with history of tobacco abuse presents to the ER on 2/23 because of persistent productive cough over the last 1 month. Patient states patient also has been having increasing weight loss estimated 30 lbs in last 6 mo (unintentional). Patient has some chest discomfort only on coughing. 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 Plan is for outpatient PET scan; pt will follow-up with oncology as outpatient for further treatments. Per MD notes, pt also underwent MRI of brain which revealed no signs of mets.   Pt was sleeping soundly at time of visit. RD did not wake. Staff report pt has been very emotional regarding recent diagnosis. Intake has improved; PO: 50-100%. Ensure has been ordered, however, pt has been refusing.   Per MD notes, pt to discharge once afebrile for 24 hours and transitioned off IV antibiotics.   Labs reviewed.   Diet Order:  Diet regular Room service appropriate?: Yes; Fluid consistency:: Thin; Fluid restriction:: 1200 mL Fluid  Skin:  Reviewed, no issues  Last BM:  09/14/15  Height:   Ht Readings from Last 1 Encounters:  09/09/15 '6\' 1"'$  (1.854 m)    Weight:   Wt Readings from Last 1 Encounters:  09/09/15 131 lb (59.421 kg)    Ideal  Body Weight:  84 kg  BMI:  Body mass index is 17.29 kg/(m^2).  Estimated Nutritional Needs:   Kcal:  1800-2000  Protein:  90-100 gm  Fluid:  1.8-1.0 L  EDUCATION NEEDS:   No education needs identified at this time  Jarrett Albor A. Jimmye Norman, RD, LDN, CDE Pager: [phone] After hours Pager: [phone]

## 2015-09-15 NOTE — Progress Notes (Signed)
Pharmacy Antibiotic Note Maurice Mcknight is a 55 y.o. male admitted on 09/08/2015 with post obstructive pneumonia in setting of bronchogenic carcinoma.  Pharmacy has been consulted for Zosyn and vancomycin dosing.  Plan: 1. Will draw vancomycin level prior to 20:00 dose tonight; goal 15-20 2. Continue EI Zosyn 3.375 gram ICV every 8 hours  3. F/u on ability to transition to PO abx   Height: '6\' 1"'$  (185.4 cm) Weight: 131 lb (59.421 kg) IBW/kg (Calculated) : 79.9  Temp (24hrs), Avg:99.8 F (37.7 C), Min:98.9 F (37.2 C), Max:101.3 F (38.5 C)   Recent Labs Lab 09/08/15 1634 09/09/15 0403 09/11/15 2134 09/11/15 2149 09/12/15 0007 09/12/15 0536 09/14/15 0551 09/15/15 0430  WBC 6.4 8.6  --  6.7  --   --  10.1 8.1  CREATININE 1.21 1.07  --  1.02  --  0.98 0.99 1.00  LATICACIDVEN  --   --  1.2  --  0.9  --   --   --     Estimated Creatinine Clearance: 69.3 mL/min (by C-G formula based on Cr of 1).    No Known Allergies  Antimicrobials this admission: Vanc 2/26 >>  Zosyn 2/26 >>  Dose adjustments this admission: n/a  Microbiology results: 2/26 UCx - ngF 2/26 BCx x2 - NGTD  2/27 Flu - negative  Thank you for allowing pharmacy to be a part of this patient's care.  Duayne Cal 09/15/2015 2:28 PM

## 2015-09-15 NOTE — Care Management Note (Signed)
Case Management Note  Patient Details  Name: JAYMIN WALN MRN: [id number] Date of Birth: 1960-08-13  Subjective/Objective:                    Action/Plan:   Expected Discharge Date:                  Expected Discharge Plan:  Home/Self Care  In-House Referral:  Clinical Social Work  Discharge planning Services  CM Consult  Post Acute Care Choice:  NA Choice offered to:  NA  DME Arranged:  N/A DME Agency:  NA  HH Arranged:  NA HH Agency:  NA  Status of Service:  Completed, signed off  Medicare Important Message Given:    Date Medicare IM Given:    Medicare IM give by:    Date Additional Medicare IM Given:    Additional Medicare Important Message give by:     If discussed at [place] of Stay Meetings, dates discussed:  09-15-15  Additional Comments: UR updated  Marilu Favre, RN 09/15/2015, 10:12 AM

## 2015-09-15 NOTE — Progress Notes (Addendum)
Pharmacy Antibiotic Note  Maurice Mcknight is a 55 y.o. male admitted on 09/08/2015 with pneumonia.  Pharmacy has been consulted for vancomycin dosing.  Vancomycin trough is subtherapeutic at 6 (and was drawn early) and SCr today is 1.  Patient has already gotten tonight's dose  Plan: - increase vancomycin to 500 mg iv q8h, 1st dose at 0400 tom - monitor renal function closely - recheck vancomycin trough at steady state  Height: '6\' 1"'$  (185.4 cm) Weight: 131 lb (59.421 kg) IBW/kg (Calculated) : 79.9  Temp (24hrs), Avg:99.8 F (37.7 C), Min:98.8 F (37.1 C), Max:101.3 F (38.5 C)   Recent Labs Lab 09/09/15 0403 09/11/15 2134 09/11/15 2149 09/12/15 0007 09/12/15 0536 09/14/15 0551 09/15/15 0430 09/15/15 1820  WBC 8.6  --  6.7  --   --  10.1 8.1  --   CREATININE 1.07  --  1.02  --  0.98 0.99 1.00  --   LATICACIDVEN  --  1.2  --  0.9  --   --   --   --   VANCOTROUGH  --   --   --   --   --   --   --  6*    Estimated Creatinine Clearance: 69.3 mL/min (by C-G formula based on Cr of 1).    No Known Allergies   Angy Swearengin, Tsz-Yin 09/15/2015 8:21 PM

## 2015-09-15 NOTE — Progress Notes (Signed)
[facility] PROGRESS NOTE  Maurice Mcknight AST:[id number] DOB: Nov 10, 1960 DOA: 09/08/2015 PCP: No primary care provider on file.  HPI/Brief narrative 55 year old male patient with history of polysubstance abuse-tobacco, cocaine & alcohol, presented to [facility] ED on 09/08/15 with 1 month history of persistent cough productive of white sputum and profound weight loss of 25-30 pounds over several months. In the ED CT chest showed lung mass suspicious for bronchogenic carcinoma. Pulmonology was consulted and patient underwent fiberoptic bronchoscopy that confirmed mass in left main bronchus emanating from left upper lobe bronchus and biopsies were taken. Pathology shows squamous cell carcinoma.  Assessment/Plan: Left upper lobe lung mass with endobronchial obstruction, squamous cell carcinoma - CT chest showed lung mass suspicious for bronchogenic carcinoma.  - Pulmonology was consulted and patient underwent fiberoptic bronchoscopy that confirmed mass in left main bronchus emanating from left upper lobe bronchus and biopsies were taken.  - Pathology confirms squamous cell carcinoma - Discussed with Dr. Elsworth Soho, Pulmonology: arranging [place] referral. Will need OP PET scan - 1.4 cm periaortic LN noted on CT - Dr. Algis Liming had Discussed with Dr. Curt Bears, Oncologist on call on 2/27 who has arranged outpatient follow-up on Friday 09/16/15-patient has to come at 9 AM for labs and 9:15 AM for M.D. visit. Patient states that he has no transport to get their-case management consulted for resources. [name] also recommended MRI brain with and without contrast. MRI without signs of mets - Patient seems to be undergoing stages of acceptance of his new diagnosis. Comforted at bedside  Postobstructive pneumonia - Patient continues with spiking temperatures, albeit not as high - Patient is continued on empiric IV vancomycin and Zosyn - Influenza panel PCR negative. Urine microscopy: Negative. Chest x-ray  shows left upper lobe collapse, postobstructive pneumonia - Hopefully can ultimately transition to oral antibiotics when afebrile  Polysubstance abuse: Tobacco, cocaine & alcohol - Cessation counseled. Patient declines nicotine patch. Placed on CIWA protocol. No overt withdrawal.  Severe malnutrition in the context of chronic illness, underweight - Dietitian input appreciated. Continue regular diet and nutritional supplements.  Hyponatremia/SIADH - Clinically appears euvolemic.  - Urine osmolarity 863, serum osmolarity 276. Continue fluid restriction.  Dental caries - Outpatient follow-up.  Code Status: Full Family Communication: Pt in room Disposition Plan: Anticipate d/c when afebrile x at least 24hrs and on PO abx   Consultants:  PCCM  Procedures:  Fiberoptic bronchoscopy and biopsy by CCM on 2/24  Antibiotics: Anti-infectives    Start     Dose/Rate Route Frequency Ordered Stop   09/12/15 1400  piperacillin-tazobactam (ZOSYN) IVPB 3.375 g     3.375 g 12.5 mL/hr over 240 Minutes Intravenous Every 8 hours 09/12/15 0745     09/12/15 0800  vancomycin (VANCOCIN) IVPB 750 mg/150 ml premix     750 mg 150 mL/hr over 60 Minutes Intravenous Every 12 hours 09/12/15 0745     09/12/15 0745  piperacillin-tazobactam (ZOSYN) IVPB 3.375 g     3.375 g 100 mL/hr over 30 Minutes Intravenous  Once 09/12/15 0745 09/12/15 0914      HPI/Subjective: Feels somewhat better today  Objective: Filed Vitals:   09/14/15 2111 09/14/15 2355 09/15/15 0628 09/15/15 1339  BP: 107/63  100/71 106/74  Pulse: 91  78 81  Temp: 101.3 F (38.5 C) 99.6 F (37.6 C) 100.2 F (37.9 C) 98.9 F (37.2 C)  TempSrc: Oral Oral Oral Oral  Resp: '16  18 18  '$ Height:      Weight:  SpO2: 98%  100% 99%    Intake/Output Summary (Last 24 hours) at 09/15/15 1548 Last data filed at 09/14/15 2253  Gross per 24 hour  Intake    610 ml  Output      0 ml  Net    610 ml   Filed Weights   09/08/15 1626  09/09/15 1320  Weight: 59.467 kg (131 lb 1.6 oz) 59.421 kg (131 lb)    Exam:   General:  Awake, in nad, laying in bed  Cardiovascular: regular, s1, s2  Respiratory: normal resp effort, no wheezing  Abdomen: soft, nondistended, pos BS  Musculoskeletal: perfused, no clubbing, no cyanosis  Data Reviewed: Basic Metabolic Panel:  Recent Labs Lab 09/09/15 0403 09/11/15 2149 09/12/15 0536 09/14/15 0551 09/15/15 0430  NA 130* 132* 132* 129* 130*  K 4.2 4.1 3.8 4.2 3.9  CL 94* 96* 98* 96* 97*  CO2 '25 27 26 25 26  '$ GLUCOSE 90 123* 97 102* 100*  BUN '20 14 12 12 13  '$ CREATININE 1.07 1.02 0.98 0.99 1.00  CALCIUM 8.6* 8.4* 8.1* 8.3* 8.3*   Liver Function Tests:  Recent Labs Lab 09/09/15 0403  AST 29  ALT 22  ALKPHOS 100  BILITOT 0.5  PROT 7.6  ALBUMIN 2.8*   No results for input(s): LIPASE, AMYLASE in the last 168 hours. No results for input(s): AMMONIA in the last 168 hours. CBC:  Recent Labs Lab 09/08/15 1634 09/09/15 0403 09/11/15 2149 09/14/15 0551 09/15/15 0430  WBC 6.4 8.6 6.7 10.1 8.1  NEUTROABS  --  5.9  --   --   --   HGB 14.9 13.6 12.9* 12.6* 12.1*  HCT 43.2 39.8 38.0* 37.7* 36.4*  MCV 88.7 89.4 87.6 87.7 87.9  PLT 338 313 348 442* 445*   Cardiac Enzymes: No results for input(s): CKTOTAL, CKMB, CKMBINDEX, TROPONINI in the last 168 hours. BNP (last 3 results) No results for input(s): BNP in the last 8760 hours.  ProBNP (last 3 results) No results for input(s): PROBNP in the last 8760 hours.  CBG: No results for input(s): GLUCAP in the last 168 hours.  Recent Results (from the past 240 hour(s))  Culture, blood (routine x 2)     Status: None (Preliminary result)   Collection Time: 09/11/15  9:35 PM  Result Value Ref Range Status   Specimen Description BLOOD LEFT ANTECUBITAL  Final   Special Requests BOTTLES DRAWN AEROBIC AND ANAEROBIC 8CC   Final   Culture NO GROWTH 4 DAYS  Final   Report Status PENDING  Incomplete  Culture, blood (routine x  2)     Status: None (Preliminary result)   Collection Time: 09/11/15  9:40 PM  Result Value Ref Range Status   Specimen Description BLOOD RIGHT ANTECUBITAL  Final   Special Requests BOTTLES DRAWN AEROBIC AND ANAEROBIC 10CC   Final   Culture NO GROWTH 4 DAYS  Final   Report Status PENDING  Incomplete  Culture, Urine     Status: None   Collection Time: 09/11/15 10:54 PM  Result Value Ref Range Status   Specimen Description URINE, RANDOM  Final   Special Requests NONE  Final   Culture MULTIPLE SPECIES PRESENT, SUGGEST RECOLLECTION  Final   Report Status 09/12/2015 FINAL  Final     Studies: Mr Kizzie Fantasia Contrast  04-Oct-2015  CLINICAL DATA:  55 year old male with lung mass.  Initial encounter. EXAM: MRI HEAD WITHOUT AND WITH CONTRAST TECHNIQUE: Multiplanar, multiecho pulse sequences of the brain and surrounding  structures were obtained without and with intravenous contrast. CONTRAST:  106m MULTIHANCE GADOBENATE DIMEGLUMINE 529 MG/ML IV SOLN COMPARISON:  None. FINDINGS: No acute infarct or intracranial hemorrhage. No intracranial enhancing lesion or bony destructive lesion noted to suggest intracranial metastatic disease. Incidentally noted are pulsation artifact and vessels extending deep into the sulci. Mild nonspecific white matter changes most consistent with result of small vessel disease. Mild parietal lobe atrophy without hydrocephalus. Major intracranial vascular structures are patent. Cervical medullary junction, pituitary region, pineal region and orbital structures unremarkable. Minimal paranasal sinus mucosal thickening. IMPRESSION: No evidence of intracranial metastatic disease. Mild small vessel disease type changes. Mild parietal lobe atrophy. Electronically Signed   By: SGenia DelM.D.   On: 09/13/2015 17:29   Dg Chest Port 1 View  09/15/2015  CLINICAL DATA:  Healthcare associated pneumonia. EXAM: PORTABLE CHEST 1 VIEW COMPARISON:  Multiple exams, including 09/11/2015 FINDINGS:  Continued complete opacification of the left upper lobe with a component of volume loss, shown to likely be due to a bronchial mass on prior CT. The right lung remains clear. Heart size normal. Left upper mediastinum obscured by the left upper lobe airspace opacity. Overall no change from 09/11/2015. IMPRESSION: 1. Stable volume loss and airspace opacity in the left upper lobe obscuring the left upper mediastinum. No change from 09/11/2015. Electronically Signed   By: WVan ClinesM.D.   On: 09/15/2015 14:31    Scheduled Meds: . feeding supplement (ENSURE ENLIVE)  237 mL Oral BID BM  . folic acid  1 mg Oral Daily  . Influenza vac split quadrivalent PF  0.5 mL Intramuscular Tomorrow-1000  . multivitamin with minerals  1 tablet Oral Daily  . piperacillin-tazobactam (ZOSYN)  IV  3.375 g Intravenous Q8H  . pneumococcal 23 valent vaccine  0.5 mL Intramuscular Tomorrow-1000  . thiamine  100 mg Oral Daily  . vancomycin  750 mg Intravenous Q12H   Continuous Infusions:   Principal Problem:   Lung mass Active Problems:   Malignant neoplasm of upper lobe of left lung (HCC)   Pneumonia   Postobstructive pneumonia   Hyponatremia   CHIU, STEPHEN K  [facility] Pager [phone] If 7PM-7AM, please contact night-coverage at [email or web address], password [email or web address] Group3/08/2015, 3:48 PM  LOS: 3 days

## 2015-09-16 ENCOUNTER — Other Ambulatory Visit: Payer: BLUE CROSS/BLUE SHIELD

## 2015-09-16 ENCOUNTER — Ambulatory Visit: Payer: BLUE CROSS/BLUE SHIELD | Admitting: Internal Medicine

## 2015-09-16 LAB — BASIC METABOLIC PANEL
ANION GAP: 10 (ref 5–15)
BUN: 11 mg/dL (ref 6–20)
CALCIUM: 8.2 mg/dL — AB (ref 8.9–10.3)
CO2: 24 mmol/L (ref 22–32)
Chloride: 95 mmol/L — ABNORMAL LOW (ref 101–111)
Creatinine, Ser: 1.05 mg/dL (ref 0.61–1.24)
Glucose, Bld: 88 mg/dL (ref 65–99)
POTASSIUM: 4.2 mmol/L (ref 3.5–5.1)
SODIUM: 129 mmol/L — AB (ref 135–145)

## 2015-09-16 LAB — CULTURE, BLOOD (ROUTINE X 2)
CULTURE: NO GROWTH
Culture: NO GROWTH

## 2015-09-16 LAB — CBC
HEMATOCRIT: 35.6 % — AB (ref 39.0–52.0)
HEMOGLOBIN: 12.1 g/dL — AB (ref 13.0–17.0)
MCH: 30 pg (ref 26.0–34.0)
MCHC: 34 g/dL (ref 30.0–36.0)
MCV: 88.3 fL (ref 78.0–100.0)
Platelets: 448 10*3/uL — ABNORMAL HIGH (ref 150–400)
RBC: 4.03 MIL/uL — AB (ref 4.22–5.81)
RDW: 12.1 % (ref 11.5–15.5)
WBC: 8.8 10*3/uL (ref 4.0–10.5)

## 2015-09-16 NOTE — Progress Notes (Signed)
[facility] PROGRESS NOTE  Maurice Mcknight TGG:[id number] DOB: [birth year] DOA: 09/08/2015 PCP: No primary care provider on file.  HPI/Brief narrative 55 year old male patient with history of polysubstance abuse-tobacco, cocaine & alcohol, presented to [facility] ED on 09/08/15 with 1 month history of persistent cough productive of white sputum and profound weight loss of 25-30 pounds over several months. In the ED CT chest showed lung mass suspicious for bronchogenic carcinoma. Pulmonology was consulted and patient underwent fiberoptic bronchoscopy that confirmed mass in left main bronchus emanating from left upper lobe bronchus and biopsies were taken. Pathology shows squamous cell carcinoma.  Assessment/Plan: Left upper lobe lung mass with endobronchial obstruction, squamous cell carcinoma - CT chest showed lung mass suspicious for bronchogenic carcinoma.  - Pulmonology was consulted and patient underwent fiberoptic bronchoscopy that confirmed mass in left main bronchus emanating from left upper lobe bronchus and biopsies were taken.  - Pathology confirms squamous cell carcinoma - Discussed with Dr. Elsworth Soho, Pulmonology: arranging [place] referral. Will need OP PET scan - 1.4 cm periaortic LN noted on CT - Dr. Algis Liming had Discussed with Dr. Curt Bears, Oncologist on call on 2/27 who has arranged outpatient follow-up on Friday 09/16/15-patient has to come at 9 AM for labs and 9:15 AM for M.D. visit. Patient states that he has no transport to get their-case management consulted for resources. [name] also recommended MRI brain with and without contrast. MRI without signs of mets - Patient seems to be undergoing stages of acceptance of his new diagnosis. Comforted at bedside and pt seems more at peace  Postobstructive pneumonia - Patient continues with spiking temperatures, albeit not as high - Patient is continued on empiric IV vancomycin and Zosyn - Influenza panel PCR negative. Urine  microscopy: Negative. Chest x-ray shows left upper lobe collapse, postobstructive pneumonia - Hopefully can ultimately transition to oral antibiotics when afebrile -Overnight still febrile. Advised continued incentive spirometry and flutter valve use. Also encourage continued ambulation  Polysubstance abuse: Tobacco, cocaine & alcohol - Cessation counseled. Patient declines nicotine patch. Placed on CIWA protocol. No overt withdrawal.  Severe malnutrition in the context of chronic illness, underweight - Dietitian input appreciated. Continue regular diet and nutritional supplements.  Hyponatremia/SIADH - Clinically appears euvolemic.  - Urine osmolarity 863, serum osmolarity 276. Continue fluid restriction.  Dental caries - Outpatient follow-up.  Code Status: Full Family Communication: Pt in room Disposition Plan: Anticipate d/c when afebrile x at least 24hrs and on PO abx   Consultants:  PCCM  Oncology - [name]  Procedures:  Fiberoptic bronchoscopy and biopsy by CCM on 2/24  Antibiotics: Anti-infectives    Start     Dose/Rate Route Frequency Ordered Stop   09/16/15 0400  vancomycin (VANCOCIN) 500 mg in sodium chloride 0.9 % 100 mL IVPB     500 mg 100 mL/hr over 60 Minutes Intravenous Every 8 hours 09/15/15 2023     09/12/15 1400  piperacillin-tazobactam (ZOSYN) IVPB 3.375 g     3.375 g 12.5 mL/hr over 240 Minutes Intravenous Every 8 hours 09/12/15 0745     09/12/15 0800  vancomycin (VANCOCIN) IVPB 750 mg/150 ml premix  Status:  Discontinued     750 mg 150 mL/hr over 60 Minutes Intravenous Every 12 hours 09/12/15 0745 09/15/15 2023   09/12/15 0745  piperacillin-tazobactam (ZOSYN) IVPB 3.375 g     3.375 g 100 mL/hr over 30 Minutes Intravenous  Once 09/12/15 0745 09/12/15 0914      HPI/Subjective: Good spirits this AM. Ambulating hallway  Objective: Filed Vitals:   09/15/15 0628 09/15/15 1339 09/15/15 2005 09/16/15 0616  BP: 100/71 106/74 103/64 96/57   Pulse: 78 81 80 85  Temp: 100.2 F (37.9 C) 98.9 F (37.2 C) 98.8 F (37.1 C) 100.2 F (37.9 C)  TempSrc: Oral Oral Oral Oral  Resp: '18 18 17 17  '$ Height:      Weight:      SpO2: 100% 99% 100% 99%    Intake/Output Summary (Last 24 hours) at 09/16/15 1654 Last data filed at 09/16/15 1335  Gross per 24 hour  Intake     50 ml  Output    700 ml  Net   -650 ml   Filed Weights   09/08/15 1626 09/09/15 1320  Weight: 59.467 kg (131 lb 1.6 oz) 59.421 kg (131 lb)    Exam:   General:  Awake, in nad, ambulating in hallway  Cardiovascular: regular, s1, s2  Respiratory: normal resp effort, no wheezing  Abdomen: soft, nondistended, pos BS  Musculoskeletal: perfused, no cyanosis  Data Reviewed: Basic Metabolic Panel:  Recent Labs Lab 09/11/15 2149 09/12/15 0536 09/14/15 0551 09/15/15 0430 09/16/15 0818  NA 132* 132* 129* 130* 129*  K 4.1 3.8 4.2 3.9 4.2  CL 96* 98* 96* 97* 95*  CO2 '27 26 25 26 24  '$ GLUCOSE 123* 97 102* 100* 88  BUN '14 12 12 13 11  '$ CREATININE 1.02 0.98 0.99 1.00 1.05  CALCIUM 8.4* 8.1* 8.3* 8.3* 8.2*   Liver Function Tests: No results for input(s): AST, ALT, ALKPHOS, BILITOT, PROT, ALBUMIN in the last 168 hours. No results for input(s): LIPASE, AMYLASE in the last 168 hours. No results for input(s): AMMONIA in the last 168 hours. CBC:  Recent Labs Lab 09/11/15 2149 09/14/15 0551 09/15/15 0430 09/16/15 0818  WBC 6.7 10.1 8.1 8.8  HGB 12.9* 12.6* 12.1* 12.1*  HCT 38.0* 37.7* 36.4* 35.6*  MCV 87.6 87.7 87.9 88.3  PLT 348 442* 445* 448*   Cardiac Enzymes: No results for input(s): CKTOTAL, CKMB, CKMBINDEX, TROPONINI in the last 168 hours. BNP (last 3 results) No results for input(s): BNP in the last 8760 hours.  ProBNP (last 3 results) No results for input(s): PROBNP in the last 8760 hours.  CBG: No results for input(s): GLUCAP in the last 168 hours.  Recent Results (from the past 240 hour(s))  Culture, blood (routine x 2)     Status:  None   Collection Time: 09/11/15  9:35 PM  Result Value Ref Range Status   Specimen Description BLOOD LEFT ANTECUBITAL  Final   Special Requests BOTTLES DRAWN AEROBIC AND ANAEROBIC 8CC   Final   Culture NO GROWTH 5 DAYS  Final   Report Status 09/16/2015 FINAL  Final  Culture, blood (routine x 2)     Status: None   Collection Time: 09/11/15  9:40 PM  Result Value Ref Range Status   Specimen Description BLOOD RIGHT ANTECUBITAL  Final   Special Requests BOTTLES DRAWN AEROBIC AND ANAEROBIC 10CC   Final   Culture NO GROWTH 5 DAYS  Final   Report Status 09/16/2015 FINAL  Final  Culture, Urine     Status: None   Collection Time: 09/11/15 10:54 PM  Result Value Ref Range Status   Specimen Description URINE, RANDOM  Final   Special Requests NONE  Final   Culture MULTIPLE SPECIES PRESENT, SUGGEST RECOLLECTION  Final   Report Status 09/12/2015 FINAL  Final     Studies: Dg Chest Port 1 View  09/15/2015  CLINICAL DATA:  Healthcare associated pneumonia. EXAM: PORTABLE CHEST 1 VIEW COMPARISON:  Multiple exams, including 09/11/2015 FINDINGS: Continued complete opacification of the left upper lobe with a component of volume loss, shown to likely be due to a bronchial mass on prior CT. The right lung remains clear. Heart size normal. Left upper mediastinum obscured by the left upper lobe airspace opacity. Overall no change from 09/11/2015. IMPRESSION: 1. Stable volume loss and airspace opacity in the left upper lobe obscuring the left upper mediastinum. No change from 09/11/2015. Electronically Signed   By: Van Clines M.D.   On: 09/15/2015 14:31    Scheduled Meds: . feeding supplement (ENSURE ENLIVE)  237 mL Oral BID BM  . folic acid  1 mg Oral Daily  . Influenza vac split quadrivalent PF  0.5 mL Intramuscular Tomorrow-1000  . multivitamin with minerals  1 tablet Oral Daily  . piperacillin-tazobactam (ZOSYN)  IV  3.375 g Intravenous Q8H  . pneumococcal 23 valent vaccine  0.5 mL Intramuscular  Tomorrow-1000  . thiamine  100 mg Oral Daily  . vancomycin  500 mg Intravenous Q8H   Continuous Infusions:   Principal Problem:   Lung mass Active Problems:   Malignant neoplasm of upper lobe of left lung (HCC)   Pneumonia   Postobstructive pneumonia   Hyponatremia   CHIU, STEPHEN K  [facility] Pager [phone]. If 7PM-7AM, please contact night-coverage at [email or web address], password [facility] 09/16/2015, 4:54 PM  LOS: 4 days

## 2015-09-16 NOTE — Progress Notes (Signed)
Pt did not have a fever this pm. Incentive spirometry and flutter valve provided. Pt educated on use of both devices

## 2015-09-17 DIAGNOSIS — Z87891 Personal history of nicotine dependence: Secondary | ICD-10-CM

## 2015-09-17 DIAGNOSIS — R05 Cough: Secondary | ICD-10-CM

## 2015-09-17 DIAGNOSIS — Z87898 Personal history of other specified conditions: Secondary | ICD-10-CM

## 2015-09-17 DIAGNOSIS — J189 Pneumonia, unspecified organism: Secondary | ICD-10-CM

## 2015-09-17 DIAGNOSIS — R634 Abnormal weight loss: Secondary | ICD-10-CM

## 2015-09-17 DIAGNOSIS — R0600 Dyspnea, unspecified: Secondary | ICD-10-CM

## 2015-09-17 LAB — CBC
HCT: 34.8 % — ABNORMAL LOW (ref 39.0–52.0)
Hemoglobin: 11.9 g/dL — ABNORMAL LOW (ref 13.0–17.0)
MCH: 30.4 pg (ref 26.0–34.0)
MCHC: 34.2 g/dL (ref 30.0–36.0)
MCV: 88.8 fL (ref 78.0–100.0)
PLATELETS: 446 10*3/uL — AB (ref 150–400)
RBC: 3.92 MIL/uL — ABNORMAL LOW (ref 4.22–5.81)
RDW: 12.3 % (ref 11.5–15.5)
WBC: 6.7 10*3/uL (ref 4.0–10.5)

## 2015-09-17 LAB — BASIC METABOLIC PANEL
ANION GAP: 6 (ref 5–15)
BUN: 10 mg/dL (ref 6–20)
CHLORIDE: 100 mmol/L — AB (ref 101–111)
CO2: 26 mmol/L (ref 22–32)
Calcium: 8.4 mg/dL — ABNORMAL LOW (ref 8.9–10.3)
Creatinine, Ser: 0.88 mg/dL (ref 0.61–1.24)
GFR calc Af Amer: >60 mL/min (ref >60)
GFR calc non Af Amer: >60 mL/min (ref >60)
GLUCOSE: 87 mg/dL (ref 65–99)
POTASSIUM: 4.5 mmol/L (ref 3.5–5.1)
SODIUM: 132 mmol/L — AB (ref 135–145)

## 2015-09-17 NOTE — Progress Notes (Signed)
[facility] PROGRESS NOTE  Maurice Mcknight HQI:[id number] DOB: October 21, 1960 DOA: 09/08/2015 PCP: No primary care provider on file.  HPI/Brief narrative 55 year old male patient with history of polysubstance abuse-tobacco, cocaine & alcohol, presented to [facility] ED on 09/08/15 with 1 month history of persistent cough productive of white sputum and profound weight loss of 25-30 pounds over several months. In the ED CT chest showed lung mass suspicious for bronchogenic carcinoma. Pulmonology was consulted and patient underwent fiberoptic bronchoscopy that confirmed mass in left main bronchus emanating from left upper lobe bronchus and biopsies were taken. Pathology shows squamous cell carcinoma.  Assessment/Plan: Left upper lobe lung mass with endobronchial obstruction, squamous cell carcinoma - CT chest showed lung mass suspicious for bronchogenic carcinoma.  - Pulmonology was consulted and patient underwent fiberoptic bronchoscopy that confirmed mass in left main bronchus emanating from left upper lobe bronchus and biopsies were taken.  - Pathology confirms squamous cell carcinoma - Discussed with Dr. Elsworth Soho, Pulmonology: arranging [place] referral. Will need OP PET scan - 1.4 cm periaortic LN noted on CT - Dr. Algis Liming had Discussed with Dr. Curt Bears, Oncologist on call on 2/27 who has arranged outpatient follow-up on Friday 09/16/15-patient has to come at 9 AM for labs and 9:15 AM for M.D. visit. Patient states that he has no transport to get their-case management consulted for resources. [name] also recommended MRI brain with and without contrast. MRI without signs of mets  - Patient seems to be undergoing stages of acceptance of his new diagnosis and seems to be in good spirits - Appreciate input by [name]  Postobstructive pneumonia - Patient is continued on empiric IV vancomycin and Zosyn - Influenza panel PCR negative. Urine microscopy: Negative. Chest x-ray shows left upper lobe  collapse, postobstructive pneumonia -Fevers are now resolving -Anticipate transition to PO abx in the next 24hrs if patient remains afebrile overnight and hopefully d/c soon afterwards  Polysubstance abuse: Tobacco, cocaine & alcohol - Cessation counseled. Patient declines nicotine patch. Placed on CIWA protocol. No overt withdrawal.  Severe malnutrition in the context of chronic illness, underweight - Dietitian input appreciated. Continue regular diet and nutritional supplements.  Hyponatremia/SIADH - Clinically appears euvolemic.  - Urine osmolarity 863, serum osmolarity 276. Continue fluid restriction.  Dental caries - Outpatient follow-up.  Code Status: Full Family Communication: Pt in room Disposition Plan: Anticipate d/c possible in next 24-48hrs   Consultants:  PCCM  Oncology - [name]  Procedures:  Fiberoptic bronchoscopy and biopsy by CCM on 2/24  Antibiotics: Anti-infectives    Start     Dose/Rate Route Frequency Ordered Stop   09/16/15 0400  vancomycin (VANCOCIN) 500 mg in sodium chloride 0.9 % 100 mL IVPB     500 mg 100 mL/hr over 60 Minutes Intravenous Every 8 hours 09/15/15 2023     09/12/15 1400  piperacillin-tazobactam (ZOSYN) IVPB 3.375 g     3.375 g 12.5 mL/hr over 240 Minutes Intravenous Every 8 hours 09/12/15 0745     09/12/15 0800  vancomycin (VANCOCIN) IVPB 750 mg/150 ml premix  Status:  Discontinued     750 mg 150 mL/hr over 60 Minutes Intravenous Every 12 hours 09/12/15 0745 09/15/15 2023   09/12/15 0745  piperacillin-tazobactam (ZOSYN) IVPB 3.375 g     3.375 g 100 mL/hr over 30 Minutes Intravenous  Once 09/12/15 0745 09/12/15 0914      HPI/Subjective: Eager to start chemo  Objective: Filed Vitals:   09/16/15 1430 09/16/15 2112 09/17/15 0707 09/17/15 1345  BP:  100/61 107/89 102/74 106/67  Pulse: 86 65 69 60  Temp: 98 F (36.7 C) 99.2 F (37.3 C) 98.5 F (36.9 C) 98.5 F (36.9 C)  TempSrc: Oral Oral Oral Oral  Resp: '18 18 18  18  '$ Height:      Weight:      SpO2: 100% 100% 100% 100%    Intake/Output Summary (Last 24 hours) at 09/17/15 1735 Last data filed at 09/17/15 1400  Gross per 24 hour  Intake    370 ml  Output    370 ml  Net      0 ml   Filed Weights   09/08/15 1626 09/09/15 1320  Weight: 59.467 kg (131 lb 1.6 oz) 59.421 kg (131 lb)    Exam:   General:  Awake, in nad, laying in bed  Cardiovascular: regular, s1, s2  Respiratory: normal resp effort, no wheezing  Abdomen: soft, nondistended, pos BS  Musculoskeletal: perfused, no cyanosis, no clubbing  Data Reviewed: Basic Metabolic Panel:  Recent Labs Lab 09/12/15 0536 09/14/15 0551 09/15/15 0430 09/16/15 0818 09/17/15 0610  NA 132* 129* 130* 129* 132*  K 3.8 4.2 3.9 4.2 4.5  CL 98* 96* 97* 95* 100*  CO2 '26 25 26 24 26  '$ GLUCOSE 97 102* 100* 88 87  BUN '12 12 13 11 10  '$ CREATININE 0.98 0.99 1.00 1.05 0.88  CALCIUM 8.1* 8.3* 8.3* 8.2* 8.4*   Liver Function Tests: No results for input(s): AST, ALT, ALKPHOS, BILITOT, PROT, ALBUMIN in the last 168 hours. No results for input(s): LIPASE, AMYLASE in the last 168 hours. No results for input(s): AMMONIA in the last 168 hours. CBC:  Recent Labs Lab 09/11/15 2149 09/14/15 0551 09/15/15 0430 09/16/15 0818 09/17/15 0610  WBC 6.7 10.1 8.1 8.8 6.7  HGB 12.9* 12.6* 12.1* 12.1* 11.9*  HCT 38.0* 37.7* 36.4* 35.6* 34.8*  MCV 87.6 87.7 87.9 88.3 88.8  PLT 348 442* 445* 448* 446*   Cardiac Enzymes: No results for input(s): CKTOTAL, CKMB, CKMBINDEX, TROPONINI in the last 168 hours. BNP (last 3 results) No results for input(s): BNP in the last 8760 hours.  ProBNP (last 3 results) No results for input(s): PROBNP in the last 8760 hours.  CBG: No results for input(s): GLUCAP in the last 168 hours.  Recent Results (from the past 240 hour(s))  Culture, blood (routine x 2)     Status: None   Collection Time: 09/11/15  9:35 PM  Result Value Ref Range Status   Specimen Description BLOOD  LEFT ANTECUBITAL  Final   Special Requests BOTTLES DRAWN AEROBIC AND ANAEROBIC 8CC   Final   Culture NO GROWTH 5 DAYS  Final   Report Status 09/16/2015 FINAL  Final  Culture, blood (routine x 2)     Status: None   Collection Time: 09/11/15  9:40 PM  Result Value Ref Range Status   Specimen Description BLOOD RIGHT ANTECUBITAL  Final   Special Requests BOTTLES DRAWN AEROBIC AND ANAEROBIC 10CC   Final   Culture NO GROWTH 5 DAYS  Final   Report Status 09/16/2015 FINAL  Final  Culture, Urine     Status: None   Collection Time: 09/11/15 10:54 PM  Result Value Ref Range Status   Specimen Description URINE, RANDOM  Final   Special Requests NONE  Final   Culture MULTIPLE SPECIES PRESENT, SUGGEST RECOLLECTION  Final   Report Status 09/12/2015 FINAL  Final     Studies: No results found.  Scheduled Meds: . feeding supplement (ENSURE ENLIVE)  237 mL Oral BID BM  . folic acid  1 mg Oral Daily  . Influenza vac split quadrivalent PF  0.5 mL Intramuscular Tomorrow-1000  . multivitamin with minerals  1 tablet Oral Daily  . piperacillin-tazobactam (ZOSYN)  IV  3.375 g Intravenous Q8H  . pneumococcal 23 valent vaccine  0.5 mL Intramuscular Tomorrow-1000  . thiamine  100 mg Oral Daily  . vancomycin  500 mg Intravenous Q8H   Continuous Infusions:   Principal Problem:   Lung mass Active Problems:   Malignant neoplasm of upper lobe of left lung (HCC)   Pneumonia   Postobstructive pneumonia   Hyponatremia   Laniqua Torrens K  [facility] Pager [phone]. If 7PM-7AM, please contact night-coverage at [email or web address], password [facility] 09/17/2015, 5:35 PM  LOS: 5 days

## 2015-09-17 NOTE — Progress Notes (Signed)
[place] Telephone:([phone]   Fax:([phone]  CONSULT NOTE  REFERRING PHYSICIAN: Dr. Marylu Lund  REASON FOR CONSULTATION:  55 years old African-American male recently diagnosed with lung cancer  HPI Maurice Mcknight is a 55 y.o. male with no significant past medical history except for long history of smoking and other substance abuse. The patient presented to [facility] [place] on 09/08/2015 complaining of one month's duration of worsening dyspnea as well as cough and weight loss. She also has some chest discomfort. During his evaluation chest x-ray was performed on 09/08/2015 and it showed complete left upper lobe collapse worrisome for underlying bronchogenic carcinoma. CT scan of the chest was performed on 09/06/2015 and it showed complete collapse of the left upper lobe, as previously noted. A lobulated contour is noted extending into the left mainstem bronchus, raising suspicion for an underlying mass measuring approximately 3.2 x 1.5 cm at the left hilum. This may reflect a bronchogenic or endobronchial lesion.There appears to be an enlarged 1.4 cm periaortic node concerning for metastatic disease. On 09/09/2015, the patient underwent a video bronchoscopy with biopsies of the left upper lobe lung mass under the care Dr. Halford Chessman.  The final pathology (Accession: [id number]) was positive for squamous cell carcinoma. MRI of the brain on 09/13/2015 showed no evidence for metastatic disease to the brain. The patient is currently undergoing treatment for postobstructive pneumonia and is feeling much better today. He continues to have shortness of breath with exertion as well as cough. He denied having any significant hemoptysis. He has recent weight loss but no night sweats. The patient denied having any headache or visual changes. He has no nausea, vomiting, diarrhea or constipation. Family history significant for a sister diagnosed with cancer. The patient is single  and has one son. He works in [employer]. He has a long history of smoking, alcohol and drug abuse (cocaine).   HPI  History reviewed. No pertinent past medical history.  Past Surgical History  Procedure Laterality Date  . Video bronchoscopy Bilateral 09/09/2015    Procedure: VIDEO BRONCHOSCOPY WITHOUT FLUORO;  Surgeon: Chesley Mires, MD;  Location: [facility] ENDOSCOPY;  Service: Cardiopulmonary;  Laterality: Bilateral;    Family History  Problem Relation Age of Onset  . Cancer Sister     Social History Social History  Substance Use Topics  . Smoking status: Current Some Day Smoker  . Smokeless tobacco: Never Used  . Alcohol Use: Yes    No Known Allergies  Current Facility-Administered Medications  Medication Dose Route Frequency Provider Last Rate Last Dose  . acetaminophen (TYLENOL) tablet 650 mg  650 mg Oral Q6H PRN Rise Patience, MD   650 mg at 09/14/15 2253  . benzonatate (TESSALON) capsule 100 mg  100 mg Oral TID PRN Donne Hazel, MD   100 mg at 09/17/15 0449  . feeding supplement (ENSURE ENLIVE) (ENSURE ENLIVE) liquid 237 mL  237 mL Oral BID BM Rise Patience, MD   237 mL at 09/12/15 9147  . folic acid (FOLVITE) tablet 1 mg  1 mg Oral Daily Modena Jansky, MD   1 mg at 09/16/15 0941  . guaiFENesin-dextromethorphan (ROBITUSSIN DM) 100-10 MG/5ML syrup 5 mL  5 mL Oral Q4H PRN Hewitt Shorts Harduk, PA-C   5 mL at 09/17/15 0143  . Influenza vac split quadrivalent PF (FLUARIX) injection 0.5 mL  0.5 mL Intramuscular Tomorrow-1000 Chesley Mires, MD   0.5 mL at 09/10/15 1000  . levalbuterol (XOPENEX) nebulizer  solution 0.63 mg  0.63 mg Nebulization Q6H PRN Rise Patience, MD      . multivitamin with minerals tablet 1 tablet  1 tablet Oral Daily Modena Jansky, MD   1 tablet at 09/16/15 0941  . ondansetron (ZOFRAN) tablet 4 mg  4 mg Oral Q6H PRN Rise Patience, MD      . piperacillin-tazobactam (ZOSYN) IVPB 3.375 g  3.375 g Intravenous Q8H Veronda P Bryk, RPH   3.375 g at  09/17/15 0604  . pneumococcal 23 valent vaccine (PNU-IMMUNE) injection 0.5 mL  0.5 mL Intramuscular Tomorrow-1000 Chesley Mires, MD   0.5 mL at 09/10/15 1000  . thiamine (VITAMIN B-1) tablet 100 mg  100 mg Oral Daily Modena Jansky, MD   100 mg at 09/16/15 0941  . vancomycin (VANCOCIN) 500 mg in sodium chloride 0.9 % 100 mL IVPB  500 mg Intravenous Q8H Donne Hazel, MD   500 mg at 09/17/15 0449    Review of Systems  Constitutional: positive for fatigue Eyes: negative Ears, nose, mouth, throat, and face: negative Respiratory: positive for cough, dyspnea on exertion and pneumonia Cardiovascular: negative Gastrointestinal: negative Genitourinary:negative Integument/breast: negative Hematologic/lymphatic: negative Musculoskeletal:negative Neurological: negative Behavioral/Psych: negative Endocrine: negative Allergic/Immunologic: negative  Physical Exam  KKX:FGHWE, healthy, no distress, well developed and malnourished SKIN: skin color, texture, turgor are normal, no rashes or significant lesions HEAD: Normocephalic, No masses, lesions, tenderness or abnormalities EYES: normal, PERRLA, Conjunctiva are pink and non-injected EARS: External ears normal, Canals clear OROPHARYNX:no exudate, no erythema and lips, buccal mucosa, and tongue normal  NECK: supple, no adenopathy, no JVD LYMPH:  no palpable lymphadenopathy, no hepatosplenomegaly LUNGS: expiratory wheezes bilaterally, scattered rhonchi bilaterally HEART: regular rate & rhythm, no murmurs and no gallops ABDOMEN:abdomen soft, non-tender, normal bowel sounds and no masses or organomegaly BACK: Back symmetric, no curvature., No CVA tenderness EXTREMITIES:no joint deformities, effusion, or inflammation, no edema, no skin discoloration  NEURO: alert & oriented x 3 with fluent speech, no focal motor/sensory deficits  PERFORMANCE STATUS: ECOG 1  LABORATORY DATA: Lab Results  Component Value Date   WBC 6.7 09/17/2015   HGB 11.9*  09/17/2015   HCT 34.8* 09/17/2015   MCV 88.8 09/17/2015   PLT 446* 09/17/2015    '@LASTCHEM'$ @  RADIOGRAPHIC STUDIES: Dg Chest 2 View  09/08/2015  CLINICAL DATA:  55 year old with cough and chest pain for 1 week. Smoker. EXAM: CHEST  2 VIEW COMPARISON:  None. FINDINGS: There is complete left upper lobe collapse with obscuration of the aortic arch and mediastinal shift to the left. The left lower lobe appears clear. The right lung is clear. There is no pleural effusion or pneumothorax. No foreign bodies are seen. The bones appear unremarkable. IMPRESSION: Complete left upper lobe collapse, worrisome for underlying bronchogenic carcinoma in a smoker. Chest CT with contrast recommended for further evaluation. Electronically Signed   By: Richardean Sale M.D.   On: 09/08/2015 17:21   Ct Chest W Contrast  09/09/2015  CLINICAL DATA:  Chronic cough and chest tenderness. Initial encounter. EXAM: CT CHEST WITH CONTRAST TECHNIQUE: Multidetector CT imaging of the chest was performed during intravenous contrast administration. CONTRAST:  40m OMNIPAQUE IOHEXOL 300 MG/ML  SOLN COMPARISON:  Chest radiograph performed earlier today at 5:23 p.m. FINDINGS: There is complete collapse of the left upper lobe, as previously noted. A lobulated contour is noted extending into the left mainstem bronchus, raising suspicion for an underlying mass measuring approximately 3.2 x 1.5 cm at the left hilum. This may  reflect a bronchogenic or endobronchial lesion. No pleural effusion or pneumothorax is seen. No pulmonary nodules are identified within the expanded portions of both lungs. The mediastinum is normal in size. 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 There appears to be an enlarged 1.4 cm periaortic node. No pericardial effusion is identified. The visualized portions of thyroid gland are unremarkable. No axillary lymphadenopathy is seen. The visualized portions of the liver are grossly unremarkable. The heterogeneous  appearance of the spleen is nonspecific, without a dominant mass. The visualized portions of the gallbladder, pancreas and adrenal glands are within normal limits. The visualized portions of the kidneys are unremarkable appearance. No acute osseous abnormalities are identified. IMPRESSION: 1. Suspect mass at the left hilum, extending into the left mainstem bronchus, likely measuring approximately 3.2 x 1.5 cm, with associated complete collapse of the left upper lobe. This may reflect a bronchogenic malignancy, or an endobronchial lesion. 2. Enlarged 1.4 cm periaortic node noted. This is concerning for metastatic disease. 3. 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 Electronically Signed   By: Garald Balding M.D.   On: 09/09/2015 00:20   Mr Jeri Cos ST Contrast  09/13/2015  CLINICAL DATA:  55 year old male with lung mass.  Initial encounter. EXAM: MRI HEAD WITHOUT AND WITH CONTRAST TECHNIQUE: Multiplanar, multiecho pulse sequences of the brain and surrounding structures were obtained without and with intravenous contrast. CONTRAST:  37m MULTIHANCE GADOBENATE DIMEGLUMINE 529 MG/ML IV SOLN COMPARISON:  None. FINDINGS: No acute infarct or intracranial hemorrhage. No intracranial enhancing lesion or bony destructive lesion noted to suggest intracranial metastatic disease. Incidentally noted are pulsation artifact and vessels extending deep into the sulci. Mild nonspecific white matter changes most consistent with result of small vessel disease. Mild parietal lobe atrophy without hydrocephalus. Major intracranial vascular structures are patent. Cervical medullary junction, pituitary region, pineal region and orbital structures unremarkable. Minimal paranasal sinus mucosal thickening. IMPRESSION: No evidence of intracranial metastatic disease. Mild small vessel disease type changes. Mild parietal lobe atrophy. Electronically Signed   By: SGenia DelM.D.   On: 09/13/2015 17:29   Dg Chest Port 1  View  09/15/2015  CLINICAL DATA:  Healthcare associated pneumonia. EXAM: PORTABLE CHEST 1 VIEW COMPARISON:  Multiple exams, including 09/11/2015 FINDINGS: Continued complete opacification of the left upper lobe with a component of volume loss, shown to likely be due to a bronchial mass on prior CT. The right lung remains clear. Heart size normal. Left upper mediastinum obscured by the left upper lobe airspace opacity. Overall no change from 09/11/2015. IMPRESSION: 1. Stable volume loss and airspace opacity in the left upper lobe obscuring the left upper mediastinum. No change from 09/11/2015. Electronically Signed   By: WVan ClinesM.D.   On: 09/15/2015 14:31   Dg Chest Port 1 View  09/12/2015  CLINICAL DATA:  Acute onset of fever and shortness of breath. Initial encounter. EXAM: PORTABLE CHEST 1 VIEW COMPARISON:  Chest radiograph and CT of the chest performed 09/08/2015 FINDINGS: Left upper lobe collapse is again noted, somewhat more dense than on the prior study. As before, this appeared to reflect a mass at the left hilum on the prior CT, with associated postobstructive pneumonia. No pleural effusion or pneumothorax is seen. The cardiomediastinal silhouette is within normal limits. No acute osseous abnormalities are seen. IMPRESSION: Left upper lobe collapse again noted. This reflects the apparent mass at the left hilum as noted on recent prior CT, with associated postobstructive pneumonia. Electronically Signed   By: JJacqulynn Cadet  Chang M.D.   On: 09/12/2015 01:46    ASSESSMENT: This is a very pleasant 55 years old African-American male with questionably stage IIIa (T2a, N2, M0) non-small cell lung cancer, squamous cell carcinoma diagnosed in February 2017 and presented with left upper lobe obstructing mass as well as questionable periaortic lymphadenopathy.   PLAN: I had a lengthy discussion with the patient today about his current disease stage, prognosis and treatment options. The patient is  currently undergoing treatment for postobstructive pneumonia and expected to be discharged from the hospital in the next few days. I will complete the staging workup by ordering a PET scan which will be done outpatient basis. I will also request his tissue block to be tested for PDL 1 expression. I discussed with the patient briefly his treatment options including a course of concurrent chemoradiation if he has no evidence of metastatic disease on the upcoming PET scan. I will arrange for him a follow-up appointment with me at the [place] for more detailed discussion of his treatment options. I will also refer the patient to radiation oncology for evaluation and discussion of the radiotherapy option. For smoke cessation, I strongly encouraged the patient to quit smoking and offered him a smoking cessation program. For the postobstructive pneumonia, the patient will continue his treatment with Zosyn and vancomycin.  The patient voices understanding of current disease status and treatment options and is in agreement with the current care plan.  All questions were answered. The patient knows to call the clinic with any problems, questions or concerns. We can certainly see the patient much sooner if necessary.  Thank you so much for allowing me to participate in the care of Maurice Mcknight. I will continue to follow up the patient with you and assist in his care.  Disclaimer: This note was dictated with voice recognition software. Similar sounding words can inadvertently be transcribed and may not be corrected upon review.   Angelina Venard K. September 17, 2015, 8:49 AM

## 2015-09-18 MED ORDER — LEVOFLOXACIN 750 MG PO TABS
750.0000 mg | ORAL_TABLET | Freq: Every day | ORAL | Status: DC
  Administered 2015-09-18: 750 mg via ORAL
  Filled 2015-09-18: qty 1

## 2015-09-18 NOTE — Progress Notes (Signed)
[facility] PROGRESS NOTE  Maurice Mcknight EAV:[id number] DOB: August 17, 1960 DOA: 09/08/2015 PCP: No primary care provider on file.  HPI/Brief narrative 55 year old male patient with history of polysubstance abuse-tobacco, cocaine & alcohol, presented to [facility] ED on 09/08/15 with 1 month history of persistent cough productive of white sputum and profound weight loss of 25-30 pounds over several months. In the ED CT chest showed lung mass suspicious for bronchogenic carcinoma. Pulmonology was consulted and patient underwent fiberoptic bronchoscopy that confirmed mass in left main bronchus emanating from left upper lobe bronchus and biopsies were taken. Pathology shows squamous cell carcinoma.  Assessment/Plan: Left upper lobe lung mass with endobronchial obstruction, squamous cell carcinoma - CT chest showed lung mass suspicious for bronchogenic carcinoma.  - Pulmonology was consulted and patient underwent fiberoptic bronchoscopy that confirmed mass in left main bronchus emanating from left upper lobe bronchus and biopsies were taken.  - Pathology confirms squamous cell carcinoma - Discussed with Dr. Elsworth Soho, Pulmonology: arranging [place] referral. Will need OP PET scan - 1.4 cm periaortic LN noted on CT - Dr. Algis Liming had Discussed with Dr. Curt Bears, Oncologist on call on 2/27 who has arranged outpatient follow-up on Friday 09/16/15-patient has to come at 9 AM for labs and 9:15 AM for M.D. visit. Patient states that he has no transport to get their-case management consulted for resources. [name] also recommended MRI brain with and without contrast. MRI without signs of mets  - Patient seems to be undergoing stages of acceptance of his new diagnosis and seems to be in good spirits - Appreciate input by [name]  Postobstructive pneumonia - Patient had been continued on empiric IV vancomycin and Zosyn - Influenza panel PCR negative. Urine microscopy: Negative. Chest x-ray shows left upper  lobe collapse, postobstructive pneumonia - Pt now afebrile for 2 days. Will transition to PO levaquin to complete 10-14 days of abx  Polysubstance abuse: Tobacco, cocaine & alcohol - Cessation counseled. Patient declines nicotine patch. Placed on CIWA protocol. No overt withdrawal.  Severe malnutrition in the context of chronic illness, underweight - Dietitian input appreciated. Continue regular diet and nutritional supplements.  Hyponatremia/SIADH - Clinically appears euvolemic.  - Urine osmolarity 863, serum osmolarity 276. Continue fluid restriction.  Dental caries - Outpatient follow-up.  Code Status: Full Family Communication: Pt in room Disposition Plan: Anticipate d/c possible tomorrow   Consultants:  PCCM  Oncology - [name]  Procedures:  Fiberoptic bronchoscopy and biopsy by CCM on 2/24  Antibiotics: Anti-infectives    Start     Dose/Rate Route Frequency Ordered Stop   09/16/15 0400  vancomycin (VANCOCIN) 500 mg in sodium chloride 0.9 % 100 mL IVPB     500 mg 100 mL/hr over 60 Minutes Intravenous Every 8 hours 09/15/15 2023     09/12/15 1400  piperacillin-tazobactam (ZOSYN) IVPB 3.375 g     3.375 g 12.5 mL/hr over 240 Minutes Intravenous Every 8 hours 09/12/15 0745     09/12/15 0800  vancomycin (VANCOCIN) IVPB 750 mg/150 ml premix  Status:  Discontinued     750 mg 150 mL/hr over 60 Minutes Intravenous Every 12 hours 09/12/15 0745 09/15/15 2023   09/12/15 0745  piperacillin-tazobactam (ZOSYN) IVPB 3.375 g     3.375 g 100 mL/hr over 30 Minutes Intravenous  Once 09/12/15 0745 09/12/15 0914      HPI/Subjective: Feels well. In good spirits  Objective: Filed Vitals:   09/17/15 0707 09/17/15 1345 09/17/15 1942 09/18/15 0427  BP: 102/74 106/67 97/62 106/66  Pulse: 69 60 76 65  Temp: 98.5 F (36.9 C) 98.5 F (36.9 C) 98.8 F (37.1 C) 99.1 F (37.3 C)  TempSrc: Oral Oral Oral Oral  Resp: '18 18 18 17  '$ Height:      Weight:      SpO2: 100% 100% 100%  100%    Intake/Output Summary (Last 24 hours) at 09/18/15 1546 Last data filed at 09/18/15 1306  Gross per 24 hour  Intake   1370 ml  Output   1350 ml  Net     20 ml   Filed Weights   09/08/15 1626 09/09/15 1320  Weight: 59.467 kg (131 lb 1.6 oz) 59.421 kg (131 lb)    Exam:   General:  Awake, in nad, laying in bed  Cardiovascular: regular, s1, s2  Respiratory: normal resp effort, no wheezing  Abdomen: soft, nondistended, pos BS  Musculoskeletal: perfused, no clubbing  Data Reviewed: Basic Metabolic Panel:  Recent Labs Lab 09/12/15 0536 09/14/15 0551 09/15/15 0430 09/16/15 0818 09/17/15 0610  NA 132* 129* 130* 129* 132*  K 3.8 4.2 3.9 4.2 4.5  CL 98* 96* 97* 95* 100*  CO2 '26 25 26 24 26  '$ GLUCOSE 97 102* 100* 88 87  BUN '12 12 13 11 10  '$ CREATININE 0.98 0.99 1.00 1.05 0.88  CALCIUM 8.1* 8.3* 8.3* 8.2* 8.4*   Liver Function Tests: No results for input(s): AST, ALT, ALKPHOS, BILITOT, PROT, ALBUMIN in the last 168 hours. No results for input(s): LIPASE, AMYLASE in the last 168 hours. No results for input(s): AMMONIA in the last 168 hours. CBC:  Recent Labs Lab 09/11/15 2149 09/14/15 0551 09/15/15 0430 09/16/15 0818 09/17/15 0610  WBC 6.7 10.1 8.1 8.8 6.7  HGB 12.9* 12.6* 12.1* 12.1* 11.9*  HCT 38.0* 37.7* 36.4* 35.6* 34.8*  MCV 87.6 87.7 87.9 88.3 88.8  PLT 348 442* 445* 448* 446*   Cardiac Enzymes: No results for input(s): CKTOTAL, CKMB, CKMBINDEX, TROPONINI in the last 168 hours. BNP (last 3 results) No results for input(s): BNP in the last 8760 hours.  ProBNP (last 3 results) No results for input(s): PROBNP in the last 8760 hours.  CBG: No results for input(s): GLUCAP in the last 168 hours.  Recent Results (from the past 240 hour(s))  Culture, blood (routine x 2)     Status: None   Collection Time: 09/11/15  9:35 PM  Result Value Ref Range Status   Specimen Description BLOOD LEFT ANTECUBITAL  Final   Special Requests BOTTLES DRAWN AEROBIC  AND ANAEROBIC 8CC   Final   Culture NO GROWTH 5 DAYS  Final   Report Status 09/16/2015 FINAL  Final  Culture, blood (routine x 2)     Status: None   Collection Time: 09/11/15  9:40 PM  Result Value Ref Range Status   Specimen Description BLOOD RIGHT ANTECUBITAL  Final   Special Requests BOTTLES DRAWN AEROBIC AND ANAEROBIC 10CC   Final   Culture NO GROWTH 5 DAYS  Final   Report Status 09/16/2015 FINAL  Final  Culture, Urine     Status: None   Collection Time: 09/11/15 10:54 PM  Result Value Ref Range Status   Specimen Description URINE, RANDOM  Final   Special Requests NONE  Final   Culture MULTIPLE SPECIES PRESENT, SUGGEST RECOLLECTION  Final   Report Status 09/12/2015 FINAL  Final     Studies: No results found.  Scheduled Meds: . feeding supplement (ENSURE ENLIVE)  237 mL Oral BID BM  . folic acid  1 mg Oral Daily  . Influenza vac split quadrivalent PF  0.5 mL Intramuscular Tomorrow-1000  . multivitamin with minerals  1 tablet Oral Daily  . piperacillin-tazobactam (ZOSYN)  IV  3.375 g Intravenous Q8H  . pneumococcal 23 valent vaccine  0.5 mL Intramuscular Tomorrow-1000  . thiamine  100 mg Oral Daily  . vancomycin  500 mg Intravenous Q8H   Continuous Infusions:   Principal Problem:   Lung mass Active Problems:   Malignant neoplasm of upper lobe of left lung (HCC)   Pneumonia   Postobstructive pneumonia   Hyponatremia   Egon Dittus K  [facility] Pager [phone]. If 7PM-7AM, please contact night-coverage at [email or web address], password [email or web address] 09/18/2015, 3:46 PM  LOS: 6 days

## 2015-09-19 LAB — BASIC METABOLIC PANEL WITH GFR
Anion gap: 8 (ref 5–15)
BUN: 9 mg/dL (ref 6–20)
CO2: 25 mmol/L (ref 22–32)
Calcium: 8.4 mg/dL — ABNORMAL LOW (ref 8.9–10.3)
Chloride: 103 mmol/L (ref 101–111)
Creatinine, Ser: 0.83 mg/dL (ref 0.61–1.24)
GFR calc Af Amer: >60 mL/min
GFR calc non Af Amer: >60 mL/min
Glucose, Bld: 89 mg/dL (ref 65–99)
Potassium: 4.2 mmol/L (ref 3.5–5.1)
Sodium: 136 mmol/L (ref 135–145)

## 2015-09-19 LAB — CBC
HCT: 36.8 % — ABNORMAL LOW (ref 39.0–52.0)
Hemoglobin: 11.9 g/dL — ABNORMAL LOW (ref 13.0–17.0)
MCH: 28.8 pg (ref 26.0–34.0)
MCHC: 32.3 g/dL (ref 30.0–36.0)
MCV: 89.1 fL (ref 78.0–100.0)
Platelets: 474 K/uL — ABNORMAL HIGH (ref 150–400)
RBC: 4.13 MIL/uL — ABNORMAL LOW (ref 4.22–5.81)
RDW: 12.1 % (ref 11.5–15.5)
WBC: 4.9 K/uL (ref 4.0–10.5)

## 2015-09-19 MED ORDER — LEVOFLOXACIN 750 MG PO TABS: 750.0000 mg | ORAL_TABLET | Freq: Every day | ORAL | Status: DC

## 2015-09-19 MED ORDER — BENZONATATE 100 MG PO CAPS: 100.0000 mg | ORAL_CAPSULE | Freq: Three times a day (TID) | ORAL | Status: DC | PRN

## 2015-09-19 NOTE — Progress Notes (Signed)
Discussed discharge summary with patient. Reviewed all medications with patient. Patient received Rx. Patient ready for discharge. 

## 2015-09-19 NOTE — Discharge Summary (Signed)
Physician Discharge Summary  Maurice Mcknight SJG:[id number] DOB: 1961/03/03 DOA: 09/08/2015  PCP: No primary care provider on file.  Admit date: 09/08/2015 Discharge date: 09/19/2015  Time spent: 20 minutes  Recommendations for Outpatient Follow-up:  1. Follow up with PCP in 2-3 weeks 2. Follow up with [name] as scheduled   Discharge Diagnoses:  Principal Problem:   Lung mass Active Problems:   Malignant neoplasm of upper lobe of left lung ([place])   Pneumonia   Postobstructive pneumonia   Hyponatremia   Discharge Condition: Improved  Diet recommendation: Regular  Filed Weights   09/08/15 1626 09/09/15 1320  Weight: 59.467 kg (131 lb 1.6 oz) 59.421 kg (131 lb)    History of present illness:  Please review dictated H and P from 2/23 for details. Briefly, 55 year old male patient with history of polysubstance abuse-tobacco, cocaine & alcohol, presented to [facility] ED on 09/08/15 with 1 month history of persistent cough productive of white sputum and profound weight loss of 25-30 pounds over several months. In the ED CT chest showed lung mass suspicious for bronchogenic carcinoma. Pulmonology was consulted and patient underwent fiberoptic bronchoscopy that confirmed mass in left main bronchus emanating from left upper lobe bronchus and biopsies were taken. Pathology shows squamous cell carcinoma.  Hospital Course:  Left upper lobe lung mass with endobronchial obstruction, squamous cell carcinoma - CT chest showed lung mass suspicious for bronchogenic carcinoma.  - Pulmonology was consulted and patient underwent fiberoptic bronchoscopy that confirmed mass in left main bronchus emanating from left upper lobe bronchus and biopsies were taken.  - Pathology confirms squamous cell carcinoma - Discussed with Dr. Elsworth Soho, Pulmonology: arranging [place] referral. Will need OP PET scan - 1.4 cm periaortic LN noted on CT - Dr. Algis Liming had Discussed with Dr. Curt Bears, Oncologist on call on 2/27  who has arranged outpatient follow-up on Friday 09/16/15-patient has to come at 9 AM for labs and 9:15 AM for M.D. visit. Patient states that he has no transport to get their-case management consulted for resources. [name] also recommended MRI brain with and without contrast. MRI without signs of mets  - Patient seems to be undergoing stages of acceptance of his new diagnosis and seems to be in good spirits - Appreciate input by [name]. Oncology to follow up with patient soon after discharge  Postobstructive pneumonia - Patient had been continued on empiric IV vancomycin and Zosyn - Influenza panel PCR negative. Urine microscopy: Negative. Chest x-ray shows left upper lobe collapse, postobstructive pneumonia - Patient transitioned to PO levaquin. Would treat for another 5 days of abx after being afebrile (5 more days of treatment on discharge)  Polysubstance abuse: Tobacco, cocaine & alcohol - Cessation counseled. Patient declines nicotine patch. Placed on CIWA protocol. No overt withdrawal.  Severe malnutrition in the context of chronic illness, underweight - Dietitian input appreciated. Continue regular diet and nutritional supplements.  Hyponatremia/SIADH - Clinically appears euvolemic.  - Urine osmolarity 863, serum osmolarity 276. Continue fluid restriction. - Resolved  Dental caries - Outpatient follow-up.   Consultants:  PCCM  Oncology - [name]  Procedures:  Fiberoptic bronchoscopy and biopsy by CCM on 2/24  Discharge Exam: Filed Vitals:   09/17/15 1942 09/18/15 0427 09/18/15 2328 09/19/15 0707  BP: 97/62 106/66 114/73 105/59  Pulse: 76 65 76 66  Temp:  99.1 F (37.3 C) 98.6 F (37 C) 98.7 F (37.1 C)  TempSrc: Oral Oral Oral Oral  Resp: '18 17 18 18  '$ Height:  Weight:      SpO2: 100% 100% 100% 100%    General: Awake, in nad Cardiovascular: regular, s1, s2 Respiratory: normal resp effort, no wheezing  Discharge Instructions      Medication List    TAKE these medications        aspirin-sod bicarb-citric acid 325 MG Tbef tablet  Commonly known as:  ALKA-SELTZER  Take 325 mg by mouth every 6 (six) hours as needed. For cold symptoms     benzonatate 100 MG capsule  Commonly known as:  TESSALON  Take 1 capsule (100 mg total) by mouth 3 (three) times daily as needed for cough.     dextromethorphan 30 MG/5ML liquid  Commonly known as:  DELSYM  Take 30 mg by mouth at bedtime as needed for cough.     levofloxacin 750 MG tablet  Commonly known as:  LEVAQUIN  Take 1 tablet (750 mg total) by mouth daily at 6 PM.       No Known Allergies Follow-up Information    Follow up with how to obtain a [employer] pcp/family dr.   Cathlean [place]se[place]:  Please verify any provider recommended to you is in network   Contact information:   Please go to [email or web address], locate find a doctor area in top right corner of page to use to find in network primary care provider and specialists  or call the toll free number on the back of your insurance card to speak with a customer service staff       Follow up with Eilleen Kempf., MD.   Specialty:  Oncology   Why:  you will be contacted to schedule an appointment   Contact information:   [place] [address] [phone]        The results of significant diagnostics from this hospitalization (including imaging, microbiology, ancillary and laboratory) are listed below for reference.    Significant Diagnostic Studies: Dg Chest 2 View  09/08/2015  CLINICAL DATA:  55 year old with cough and chest pain for 1 week. Smoker. EXAM: CHEST  2 VIEW COMPARISON:  None. FINDINGS: There is complete left upper lobe collapse with obscuration of the aortic arch and mediastinal shift to the left. The left lower lobe appears clear. The right lung is clear. There is no pleural effusion or pneumothorax. No foreign bodies are seen. The bones appear unremarkable. IMPRESSION: Complete  left upper lobe collapse, worrisome for underlying bronchogenic carcinoma in a smoker. Chest CT with contrast recommended for further evaluation. Electronically Signed   By: Richardean Sale M.D.   On: 09/08/2015 17:21   Ct Chest W Contrast  09/09/2015  CLINICAL DATA:  Chronic cough and chest tenderness. Initial encounter. EXAM: CT CHEST WITH CONTRAST TECHNIQUE: Multidetector CT imaging of the chest was performed during intravenous contrast administration. CONTRAST:  33m OMNIPAQUE IOHEXOL 300 MG/ML  SOLN COMPARISON:  Chest radiograph performed earlier today at 5:23 p.m. FINDINGS: There is complete collapse of the left upper lobe, as previously noted. A lobulated contour is noted extending into the left mainstem bronchus, raising suspicion for an underlying mass measuring approximately 3.2 x 1.5 cm at the left hilum. This may reflect a bronchogenic or endobronchial lesion. No pleural effusion or pneumothorax is seen. No pulmonary nodules are identified within the expanded portions of both lungs. The mediastinum is normal in size. 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 There appears to be an enlarged 1.4 cm periaortic node. No pericardial effusion is identified. The visualized portions  of thyroid gland are unremarkable. No axillary lymphadenopathy is seen. The visualized portions of the liver are grossly unremarkable. The heterogeneous appearance of the spleen is nonspecific, without a dominant mass. The visualized portions of the gallbladder, pancreas and adrenal glands are within normal limits. The visualized portions of the kidneys are unremarkable appearance. No acute osseous abnormalities are identified. IMPRESSION: 1. Suspect mass at the left hilum, extending into the left mainstem bronchus, likely measuring approximately 3.2 x 1.5 cm, with associated complete collapse of the left upper lobe. This may reflect a bronchogenic malignancy, or an endobronchial lesion. 2. Enlarged 1.4 cm periaortic node  noted. This is concerning for metastatic disease. 3. 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 Electronically Signed   By: Garald Balding M.D.   On: 09/09/2015 00:20   Mr Jeri Cos WN Contrast  09/13/2015  CLINICAL DATA:  55 year old male with lung mass.  Initial encounter. EXAM: MRI HEAD WITHOUT AND WITH CONTRAST TECHNIQUE: Multiplanar, multiecho pulse sequences of the brain and surrounding structures were obtained without and with intravenous contrast. CONTRAST:  20m MULTIHANCE GADOBENATE DIMEGLUMINE 529 MG/ML IV SOLN COMPARISON:  None. FINDINGS: No acute infarct or intracranial hemorrhage. No intracranial enhancing lesion or bony destructive lesion noted to suggest intracranial metastatic disease. Incidentally noted are pulsation artifact and vessels extending deep into the sulci. Mild nonspecific white matter changes most consistent with result of small vessel disease. Mild parietal lobe atrophy without hydrocephalus. Major intracranial vascular structures are patent. Cervical medullary junction, pituitary region, pineal region and orbital structures unremarkable. Minimal paranasal sinus mucosal thickening. IMPRESSION: No evidence of intracranial metastatic disease. Mild small vessel disease type changes. Mild parietal lobe atrophy. Electronically Signed   By: SGenia DelM.D.   On: 09/13/2015 17:29   Dg Chest Port 1 View  09/15/2015  CLINICAL DATA:  Healthcare associated pneumonia. EXAM: PORTABLE CHEST 1 VIEW COMPARISON:  Multiple exams, including 09/11/2015 FINDINGS: Continued complete opacification of the left upper lobe with a component of volume loss, shown to likely be due to a bronchial mass on prior CT. The right lung remains clear. Heart size normal. Left upper mediastinum obscured by the left upper lobe airspace opacity. Overall no change from 09/11/2015. IMPRESSION: 1. Stable volume loss and airspace opacity in the left upper lobe obscuring the left upper mediastinum. No change from  09/11/2015. Electronically Signed   By: WVan ClinesM.D.   On: 09/15/2015 14:31   Dg Chest Port 1 View  09/12/2015  CLINICAL DATA:  Acute onset of fever and shortness of breath. Initial encounter. EXAM: PORTABLE CHEST 1 VIEW COMPARISON:  Chest radiograph and CT of the chest performed 09/08/2015 FINDINGS: Left upper lobe collapse is again noted, somewhat more dense than on the prior study. As before, this appeared to reflect a mass at the left hilum on the prior CT, with associated postobstructive pneumonia. No pleural effusion or pneumothorax is seen. The cardiomediastinal silhouette is within normal limits. No acute osseous abnormalities are seen. IMPRESSION: Left upper lobe collapse again noted. This reflects the apparent mass at the left hilum as noted on recent prior CT, with associated postobstructive pneumonia. Electronically Signed   By: JGarald BaldingM.D.   On: 09/12/2015 01:46    Microbiology: Recent Results (from the past 240 hour(s))  Culture, blood (routine x 2)     Status: None   Collection Time: 09/11/15  9:35 PM  Result Value Ref Range Status   Specimen Description BLOOD LEFT ANTECUBITAL  Final   Special Requests  BOTTLES DRAWN AEROBIC AND ANAEROBIC 8CC   Final   Culture NO GROWTH 5 DAYS  Final   Report Status 09/16/2015 FINAL  Final  Culture, blood (routine x 2)     Status: None   Collection Time: 09/11/15  9:40 PM  Result Value Ref Range Status   Specimen Description BLOOD RIGHT ANTECUBITAL  Final   Special Requests BOTTLES DRAWN AEROBIC AND ANAEROBIC 10CC   Final   Culture NO GROWTH 5 DAYS  Final   Report Status 09/16/2015 FINAL  Final  Culture, Urine     Status: None   Collection Time: 09/11/15 10:54 PM  Result Value Ref Range Status   Specimen Description URINE, RANDOM  Final   Special Requests NONE  Final   Culture MULTIPLE SPECIES PRESENT, SUGGEST RECOLLECTION  Final   Report Status 09/12/2015 FINAL  Final     Labs: Basic Metabolic Panel:  Recent  Labs Lab 09/14/15 0551 09/15/15 0430 09/16/15 0818 09/17/15 0610 09/19/15 0803  NA 129* 130* 129* 132* 136  K 4.2 3.9 4.2 4.5 4.2  CL 96* 97* 95* 100* 103  CO2 '25 26 24 26 25  '$ GLUCOSE 102* 100* 88 87 89  BUN '12 13 11 10 9  '$ CREATININE 0.99 1.00 1.05 0.88 0.83  CALCIUM 8.3* 8.3* 8.2* 8.4* 8.4*   Liver Function Tests: No results for input(s): AST, ALT, ALKPHOS, BILITOT, PROT, ALBUMIN in the last 168 hours. No results for input(s): LIPASE, AMYLASE in the last 168 hours. No results for input(s): AMMONIA in the last 168 hours. CBC:  Recent Labs Lab 09/14/15 0551 09/15/15 0430 09/16/15 0818 09/17/15 0610 09/19/15 0803  WBC 10.1 8.1 8.8 6.7 4.9  HGB 12.6* 12.1* 12.1* 11.9* 11.9*  HCT 37.7* 36.4* 35.6* 34.8* 36.8*  MCV 87.7 87.9 88.3 88.8 89.1  PLT 442* 445* 448* 446* 474*   Cardiac Enzymes: No results for input(s): CKTOTAL, CKMB, CKMBINDEX, TROPONINI in the last 168 hours. BNP: BNP (last 3 results) No results for input(s): BNP in the last 8760 hours.  ProBNP (last 3 results) No results for input(s): PROBNP in the last 8760 hours.  CBG: No results for input(s): GLUCAP in the last 168 hours.   Signed:  CHIU, STEPHEN K  [facility] 09/19/2015, 10:05 AM

## 2015-09-19 NOTE — Progress Notes (Signed)
Went into patients room to receive am report from [place], Therapist, sports. Patient started saying smart comments using explicit language and asking questions like "why y'all in my room doing report, don't nobody else do it. You don't need to know "s" about me, you not the Doctor! We explained to patient why it was important to give report on him. Patient then starts to yell out while we carry on with report get the "F" out my room! Get the "F" out! I explained to patient that we will not except that kind of behavior and it keeps going forward then we will take further action and call security.

## 2015-09-20 ENCOUNTER — Telehealth: Payer: Self-pay | Admitting: *Deleted

## 2015-09-20 NOTE — Telephone Encounter (Signed)
"  I was discharged from the hospital yesterday.  [name] told me to call for an appointment."  Call transferred to new patient coordinator.

## 2015-09-23 ENCOUNTER — Telehealth: Payer: Self-pay | Admitting: Internal Medicine

## 2015-09-23 NOTE — Telephone Encounter (Signed)
Completed Intake on Pt. Verified all demo. Pt confirmed Appt.

## 2015-09-28 ENCOUNTER — Telehealth: Payer: Self-pay | Admitting: Internal Medicine

## 2015-09-28 ENCOUNTER — Other Ambulatory Visit (HOSPITAL_BASED_OUTPATIENT_CLINIC_OR_DEPARTMENT_OTHER): Payer: BLUE CROSS/BLUE SHIELD

## 2015-09-28 ENCOUNTER — Ambulatory Visit (HOSPITAL_BASED_OUTPATIENT_CLINIC_OR_DEPARTMENT_OTHER): Payer: BLUE CROSS/BLUE SHIELD | Admitting: Internal Medicine

## 2015-09-28 ENCOUNTER — Encounter: Payer: Self-pay | Admitting: Internal Medicine

## 2015-09-28 VITALS — BP 100/69 | HR 58 | Temp 98.3°F | Resp 18 | Ht 73.0 in | Wt 140.6 lb

## 2015-09-28 DIAGNOSIS — J189 Pneumonia, unspecified organism: Secondary | ICD-10-CM

## 2015-09-28 DIAGNOSIS — R0609 Other forms of dyspnea: Secondary | ICD-10-CM | POA: Diagnosis not present

## 2015-09-28 DIAGNOSIS — F17200 Nicotine dependence, unspecified, uncomplicated: Secondary | ICD-10-CM

## 2015-09-28 DIAGNOSIS — C3412 Malignant neoplasm of upper lobe, left bronchus or lung: Secondary | ICD-10-CM

## 2015-09-28 DIAGNOSIS — Z720 Tobacco use: Secondary | ICD-10-CM

## 2015-09-28 DIAGNOSIS — R634 Abnormal weight loss: Secondary | ICD-10-CM

## 2015-09-28 DIAGNOSIS — R05 Cough: Secondary | ICD-10-CM | POA: Diagnosis not present

## 2015-09-28 DIAGNOSIS — E46 Unspecified protein-calorie malnutrition: Secondary | ICD-10-CM | POA: Insufficient documentation

## 2015-09-28 DIAGNOSIS — E639 Nutritional deficiency, unspecified: Secondary | ICD-10-CM

## 2015-09-28 HISTORY — DX: Nicotine dependence, unspecified, uncomplicated: F17.200

## 2015-09-28 HISTORY — DX: Unspecified protein-calorie malnutrition: E46

## 2015-09-28 LAB — COMPREHENSIVE METABOLIC PANEL
ALT: 105 U/L — AB (ref 0–55)
AST: 59 U/L — AB (ref 5–34)
Albumin: 3 g/dL — ABNORMAL LOW (ref 3.5–5.0)
Alkaline Phosphatase: 142 U/L (ref 40–150)
Anion Gap: 5 mEq/L (ref 3–11)
BUN: 15.3 mg/dL (ref 7.0–26.0)
CALCIUM: 8.8 mg/dL (ref 8.4–10.4)
CHLORIDE: 104 meq/L (ref 98–109)
CO2: 30 meq/L — AB (ref 22–29)
CREATININE: 0.9 mg/dL (ref 0.7–1.3)
EGFR: >90 mL/min/{1.73_m2} (ref >90)
GLUCOSE: 85 mg/dL (ref 70–140)
Potassium: 4.6 mEq/L (ref 3.5–5.1)
Sodium: 139 mEq/L (ref 136–145)
Total Bilirubin: <0.3 mg/dL (ref 0.20–1.20)
Total Protein: 7.9 g/dL (ref 6.4–8.3)

## 2015-09-28 LAB — CBC WITH DIFFERENTIAL/PLATELET
BASO%: 0.6 % (ref 0.0–2.0)
BASOS ABS: 0 10*3/uL (ref 0.0–0.1)
EOS ABS: 0.3 10*3/uL (ref 0.0–0.5)
EOS%: 4.7 % (ref 0.0–7.0)
HCT: 41.5 % (ref 38.4–49.9)
HEMOGLOBIN: 13.3 g/dL (ref 13.0–17.1)
LYMPH%: 46.1 % (ref 14.0–49.0)
MCH: 29.3 pg (ref 27.2–33.4)
MCHC: 32.1 g/dL (ref 32.0–36.0)
MCV: 91.2 fL (ref 79.3–98.0)
MONO#: 0.7 10*3/uL (ref 0.1–0.9)
MONO%: 12.4 % (ref 0.0–14.0)
NEUT%: 36.2 % — ABNORMAL LOW (ref 39.0–75.0)
NEUTROS ABS: 2 10*3/uL (ref 1.5–6.5)
Platelets: 326 10*3/uL (ref 140–400)
RBC: 4.55 10*6/uL (ref 4.20–5.82)
RDW: 13.6 % (ref 11.0–14.6)
WBC: 5.5 10*3/uL (ref 4.0–10.3)
lymph#: 2.5 10*3/uL (ref 0.9–3.3)

## 2015-09-28 MED ORDER — PROCHLORPERAZINE MALEATE 10 MG PO TABS: 10.0000 mg | ORAL_TABLET | Freq: Four times a day (QID) | ORAL | Status: DC | PRN

## 2015-09-28 NOTE — Telephone Encounter (Signed)
Gave and printed appt sched and avs for pt for March thru May °

## 2015-09-28 NOTE — Progress Notes (Signed)
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  PRIOR THERAPY: None.  CURRENT THERAPY: Concurrent chemoradiation with weekly carboplatin for AUC of 2 and paclitaxel 45 MG/M2. First treatment expected on 10/10/2015.  INTERVAL HISTORY: Maurice Mcknight 55 y.o. male returns to the clinic today for hospital follow-up visit. The patient is recently diagnosed with questionable stage IIIa non-small cell lung cancer, squamous cell carcinoma. He was seen during his hospitalization. He was treated at that time for postobstructive pneumonia. The patient is feeling much better today but continues to have shortness breath with exertion as well as mild cough. He lost few pounds recently. He denied having any significant chest pain or hemoptysis. He denied having any significant fever or chills. He has no nausea or vomiting. He is here today for evaluation and discussion of his treatment options.   MEDICAL HISTORY:No past medical history on file.  ALLERGIES:  has No Known Allergies.  MEDICATIONS:  Current Outpatient Prescriptions  Medication Sig Dispense Refill  . aspirin-sod bicarb-citric acid (ALKA-SELTZER) 325 MG TBEF tablet Take 325 mg by mouth every 6 (six) hours as needed. For cold symptoms    . benzonatate (TESSALON) 100 MG capsule Take 1 capsule (100 mg total) by mouth 3 (three) times daily as needed for cough. 20 capsule 0  . dextromethorphan (DELSYM) 30 MG/5ML liquid Take 30 mg by mouth at bedtime as needed for cough.    Marland Kitchen levofloxacin (LEVAQUIN) 750 MG tablet Take 1 tablet (750 mg total) by mouth daily at 6 PM. 5 tablet 0   No current facility-administered medications for  this visit.    SURGICAL HISTORY:  Past Surgical History  Procedure Laterality Date  . Video bronchoscopy Bilateral 09/09/2015    Procedure: VIDEO BRONCHOSCOPY WITHOUT FLUORO;  Surgeon: Chesley Mires, MD;  Location: [facility] ENDOSCOPY;  Service: Cardiopulmonary;  Laterality: Bilateral;    REVIEW OF SYSTEMS:  Constitutional: positive for fatigue and weight loss Eyes: negative Ears, nose, mouth, throat, and face: negative Respiratory: positive for cough, dyspnea on exertion and sputum Cardiovascular: negative Gastrointestinal: negative Genitourinary:negative Integument/breast: negative Hematologic/lymphatic: negative Musculoskeletal:negative Neurological: negative Behavioral/Psych: negative Endocrine: negative Allergic/Immunologic: negative   PHYSICAL EXAMINATION: General appearance: alert, cooperative, fatigued and no distress Head: Normocephalic, without obvious abnormality, atraumatic Neck: no adenopathy, no JVD, supple, symmetrical, trachea midline and thyroid not enlarged, symmetric, no tenderness/mass/nodules Lymph nodes: Cervical, supraclavicular, and axillary nodes normal. Resp: diminished breath sounds LUL and wheezes bilaterally Back: symmetric, no curvature. ROM normal. No CVA tenderness. Cardio: regular rate and rhythm, S1, S2 normal, no murmur, click, rub or gallop GI: soft, non-tender; bowel sounds normal; no masses,  no organomegaly Extremities: extremities normal, atraumatic, no cyanosis or edema Neurologic: Alert and oriented X 3, normal strength and tone. Normal symmetric reflexes. Normal coordination and gait  ECOG PERFORMANCE STATUS: 1 - Symptomatic but completely ambulatory  Blood pressure 100/69, pulse 58, temperature 98.3 F (36.8 C), temperature source Oral, resp. rate 18, height '6\' 1"'$  (1.854 m), weight 140 lb 9.6 oz (63.776 kg), SpO2 100 %.  LABORATORY DATA: Lab Results  Component Value Date   WBC 5.5 09/28/2015   HGB 13.3 09/28/2015   HCT 41.5 09/28/2015    MCV 91.2 09/28/2015  PLT 326 09/28/2015      Chemistry      Component Value Date/Time   NA 139 09/28/2015 1242   NA 136 09/19/2015 0803   K 4.6 09/28/2015 1242   K 4.2 09/19/2015 0803   CL 103 09/19/2015 0803   CO2 30* 09/28/2015 1242   CO2 25 09/19/2015 0803   BUN 15.3 09/28/2015 1242   BUN 9 09/19/2015 0803   CREATININE 0.9 09/28/2015 1242   CREATININE 0.83 09/19/2015 0803      Component Value Date/Time   CALCIUM 8.8 09/28/2015 1242   CALCIUM 8.4* 09/19/2015 0803   ALKPHOS 142 09/28/2015 1242   ALKPHOS 100 09/09/2015 0403   AST 59* 09/28/2015 1242   AST 29 09/09/2015 0403   ALT 105* 09/28/2015 1242   ALT 22 09/09/2015 0403   BILITOT <0.30 09/28/2015 1242   BILITOT 0.5 09/09/2015 0403       RADIOGRAPHIC STUDIES: Dg Chest 2 View  09/08/2015  CLINICAL DATA:  55 year old with cough and chest pain for 1 week. Smoker. EXAM: CHEST  2 VIEW COMPARISON:  None. FINDINGS: There is complete left upper lobe collapse with obscuration of the aortic arch and mediastinal shift to the left. The left lower lobe appears clear. The right lung is clear. There is no pleural effusion or pneumothorax. No foreign bodies are seen. The bones appear unremarkable. IMPRESSION: Complete left upper lobe collapse, worrisome for underlying bronchogenic carcinoma in a smoker. Chest CT with contrast recommended for further evaluation. Electronically Signed   By: Richardean Sale M.D.   On: 09/08/2015 17:21   Ct Chest W Contrast  09/09/2015  CLINICAL DATA:  Chronic cough and chest tenderness. Initial encounter. EXAM: CT CHEST WITH CONTRAST TECHNIQUE: Multidetector CT imaging of the chest was performed during intravenous contrast administration. CONTRAST:  65m OMNIPAQUE IOHEXOL 300 MG/ML  SOLN COMPARISON:  Chest radiograph performed earlier today at 5:23 p.m. FINDINGS: There is complete collapse of the left upper lobe, as previously noted. A lobulated contour is noted extending into the left mainstem bronchus,  raising suspicion for an underlying mass measuring approximately 3.2 x 1.5 cm at the left hilum. This may reflect a bronchogenic or endobronchial lesion. No pleural effusion or pneumothorax is seen. No pulmonary nodules are identified within the expanded portions of both lungs. The mediastinum is normal in size. 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 There appears to be an enlarged 1.4 cm periaortic node. No pericardial effusion is identified. The visualized portions of thyroid gland are unremarkable. No axillary lymphadenopathy is seen. The visualized portions of the liver are grossly unremarkable. The heterogeneous appearance of the spleen is nonspecific, without a dominant mass. The visualized portions of the gallbladder, pancreas and adrenal glands are within normal limits. The visualized portions of the kidneys are unremarkable appearance. No acute osseous abnormalities are identified. IMPRESSION: 1. Suspect mass at the left hilum, extending into the left mainstem bronchus, likely measuring approximately 3.2 x 1.5 cm, with associated complete collapse of the left upper lobe. This may reflect a bronchogenic malignancy, or an endobronchial lesion. 2. Enlarged 1.4 cm periaortic node noted. This is concerning for metastatic disease. 3. 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 Electronically Signed   By: JGarald BaldingM.D.   On: 09/09/2015 00:20   Mr BJeri CosWWUContrast  09/13/2015  CLINICAL DATA:  55year old male with lung mass.  Initial encounter. EXAM: MRI HEAD WITHOUT AND WITH CONTRAST TECHNIQUE: Multiplanar, multiecho pulse sequences of the brain and surrounding structures  were obtained without and with intravenous contrast. CONTRAST:  43m MULTIHANCE GADOBENATE DIMEGLUMINE 529 MG/ML IV SOLN COMPARISON:  None. FINDINGS: No acute infarct or intracranial hemorrhage. No intracranial enhancing lesion or bony destructive lesion noted to suggest intracranial metastatic disease. Incidentally  noted are pulsation artifact and vessels extending deep into the sulci. Mild nonspecific white matter changes most consistent with result of small vessel disease. Mild parietal lobe atrophy without hydrocephalus. Major intracranial vascular structures are patent. Cervical medullary junction, pituitary region, pineal region and orbital structures unremarkable. Minimal paranasal sinus mucosal thickening. IMPRESSION: No evidence of intracranial metastatic disease. Mild small vessel disease type changes. Mild parietal lobe atrophy. Electronically Signed   By: SGenia DelM.D.   On: 09/13/2015 17:29   Dg Chest Port 1 View  09/15/2015  CLINICAL DATA:  Healthcare associated pneumonia. EXAM: PORTABLE CHEST 1 VIEW COMPARISON:  Multiple exams, including 09/11/2015 FINDINGS: Continued complete opacification of the left upper lobe with a component of volume loss, shown to likely be due to a bronchial mass on prior CT. The right lung remains clear. Heart size normal. Left upper mediastinum obscured by the left upper lobe airspace opacity. Overall no change from 09/11/2015. IMPRESSION: 1. Stable volume loss and airspace opacity in the left upper lobe obscuring the left upper mediastinum. No change from 09/11/2015. Electronically Signed   By: WVan ClinesM.D.   On: 09/15/2015 14:31   Dg Chest Port 1 View  09/12/2015  CLINICAL DATA:  Acute onset of fever and shortness of breath. Initial encounter. EXAM: PORTABLE CHEST 1 VIEW COMPARISON:  Chest radiograph and CT of the chest performed 09/08/2015 FINDINGS: Left upper lobe collapse is again noted, somewhat more dense than on the prior study. As before, this appeared to reflect a mass at the left hilum on the prior CT, with associated postobstructive pneumonia. No pleural effusion or pneumothorax is seen. The cardiomediastinal silhouette is within normal limits. No acute osseous abnormalities are seen. IMPRESSION: Left upper lobe collapse again noted. This reflects the  apparent mass at the left hilum as noted on recent prior CT, with associated postobstructive pneumonia. Electronically Signed   By: JGarald BaldingM.D.   On: 09/12/2015 01:46    ASSESSMENT AND PLAN: This is a very pleasant 55years old African-American male with questionable as stage IIIa non-small cell lung cancer, squamous cell carcinoma with large obstructing left upper lobe lung mass and questionable mediastinal lymphadenopathy diagnosed in February 2017. I had a lengthy discussion with the patient today about his current disease stage, prognosis and treatment options. I will complete the staging workup by ordering a PET scan to rule out any metastatic disease. I will also send the tissue block to be tested for PDL 1 expression I will also refer the patient to radiation oncology for evaluation and consideration of concurrent radiotherapy. I discussed with the patient his treatment options including a course of concurrent chemoradiation if he has no evidence for metastatic disease on the upcoming PET scan. I recommended for the patient a course of concurrent chemoradiation with weekly carboplatin for AUC of 2 and paclitaxel 45 MG/M2. I discussed with the patient adverse effect of the chemotherapy including but not limited to alopecia, myelosuppression, nausea and vomiting, peripheral neuropathy, liver or renal dysfunction. I will arrange for the patient to have a chemotherapy education class before starting the first dose of his chemotherapy. He is expected to start the first dose of this treatment on 10/10/2015. For the weight loss and poor nutrition, I  will refer the patient to the nutritionist at the [facility] for evaluation. The patient also has a lot of social issues and he would benefit from meeting with the social worker. For smoke cessation, I strongly encouraged the patient to quit smoking and offered him a smoke cessation program. I will call his pharmacy with prescription for  Compazine 10 mg by mouth every 6 hours as needed for nausea. The patient would come back for follow-up visit in 3 weeks for reevaluation and management of any adverse effect of his treatment. He was advised to call immediately if he has any concerning symptoms in the interval. The patient voices understanding of current disease status and treatment options and is in agreement with the current care plan.  All questions were answered. The patient knows to call the clinic with any problems, questions or concerns. We can certainly see the patient much sooner if necessary.  I spent 20 minutes counseling the patient face to face. The total time spent in the appointment was 30 minutes.  Disclaimer: This note was dictated with voice recognition software. Similar sounding words can inadvertently be transcribed and may not be corrected upon review.

## 2015-10-05 ENCOUNTER — Other Ambulatory Visit: Payer: Self-pay | Admitting: Medical Oncology

## 2015-10-05 ENCOUNTER — Other Ambulatory Visit: Payer: BLUE CROSS/BLUE SHIELD

## 2015-10-05 ENCOUNTER — Encounter: Payer: Self-pay | Admitting: *Deleted

## 2015-10-05 ENCOUNTER — Encounter: Payer: Self-pay | Admitting: Internal Medicine

## 2015-10-05 DIAGNOSIS — R110 Nausea: Secondary | ICD-10-CM

## 2015-10-05 MED ORDER — PROCHLORPERAZINE MALEATE 10 MG PO TABS: 10.0000 mg | ORAL_TABLET | Freq: Four times a day (QID) | ORAL | Status: DC | PRN

## 2015-10-05 MED FILL — PROCHLORPERAZINE 10 MG TAB: 10 | 7 days supply | Qty: 30 | Fill #0

## 2015-10-05 NOTE — Progress Notes (Signed)
Oncology Nurse Navigator Documentation  Oncology Nurse Navigator Flowsheets 10/05/2015  Navigator Encounter Type Other  Barriers/Navigation Needs Coordination of Care  Interventions Coordination of Care  Coordination of Care Appts  Time Spent with Patient 15   [name] order Rad Onc appt on 09/29/15.  Patient has not been seen yet.  I notified Rad Onc scheduling to schedule.

## 2015-10-05 NOTE — Progress Notes (Signed)
Met w/ pt regarding financial assistance.  Unfortunately there aren't any foundations offering copay assistance for his Dx but offered the [employer] which he was approved for & gave him an expense sheet.  Called [employer] for additional assistance w/ transportation & food.  She met with him today.

## 2015-10-06 ENCOUNTER — Encounter: Payer: Self-pay | Admitting: Radiation Oncology

## 2015-10-06 ENCOUNTER — Ambulatory Visit
Admission: RE | Admit: 2015-10-06 | Discharge: 2015-10-06 | Disposition: A | Discharge status: Home / Self Care | Payer: BLUE CROSS/BLUE SHIELD | Source: Ambulatory Visit | Attending: Radiation Oncology | Admitting: Radiation Oncology

## 2015-10-06 VITALS — BP 105/70 | HR 99 | Temp 98.4°F | Ht 73.0 in | Wt 140.9 lb

## 2015-10-06 DIAGNOSIS — C3412 Malignant neoplasm of upper lobe, left bronchus or lung: Secondary | ICD-10-CM

## 2015-10-06 DIAGNOSIS — Z510 Encounter for antineoplastic radiation therapy: Secondary | ICD-10-CM | POA: Diagnosis not present

## 2015-10-06 NOTE — Progress Notes (Signed)
Radiation Oncology         [phone] ________________________________  Initial Outpatient Consultation  Name: Maurice Mcknight MRN: [id number]  Date: 10/06/2015  DOB: 12-28-1960  CC:No PCP Per Patient  Maurice Bears, MD   REFERRING PHYSICIAN: Curt Bears, MD  DIAGNOSIS:  stage IIIA (T2a, N2, M0) non-small cell lung cancer, squamous cell carcinoma diagnosed in February 2017 and presented with left upper lobe obstructing mass as well as questionable periaortic lymphadenopathy (PET scan pending)  HISTORY OF PRESENT ILLNESS::Maurice Mcknight is a 55 y.o. male who presented to [facility] hospital on 09/08/2015 complaining of one month's duration of worsening dyspnea as well as cough,chest discomfort and weight loss. During his evaluation, chest x-ray was performed on 09/08/2015 and it showed complete left upper lobe collapse worrisome for underlying bronchogenic carcinoma. CT scan of the chest was performed on 09/06/2015 and it showed complete collapse of the left upper lobe, as previously noted. A lobulated contour is noted extending into the left mainstem bronchus, raising suspicion for an underlying mass measuring approximately 3.2 x 1.5 cm at the left hilum. This may reflect a bronchogenic or endobronchial lesion.There appears to be an enlarged 1.4 cm periaortic node concerning for metastatic disease. On 09/09/2015, the patient underwent a video bronchoscopy with biopsies of the left upper lobe lung mass under the care Dr. Halford Chessman. The final pathology (Accession: [id number]) was positive for squamous cell carcinoma. MRI of the brain on 09/13/2015 showed no evidence for metastatic disease to the brain.The patient was discharged from the ED on 09/19/15, he was treated for postobstructive pneumonia.   On today's visit, he denies pain in the chest, hemoptysis, no back pain. Patient lives by himself. Complaints of some shortness of breath  PREVIOUS RADIATION THERAPY: No  PAST MEDICAL HISTORY:  has  a past medical history of Malnutrition ([place]) (09/28/2015) and Needs smoking cessation education (09/28/2015).    PAST SURGICAL HISTORY: Past Surgical History  Procedure Laterality Date  . Video bronchoscopy Bilateral 09/09/2015    Diagnosis    FAMILY HISTORY: family history includes Cancer in his sister.  SOCIAL HISTORY:  reports that he has been smoking Cigarettes.  He started smoking about 30 years ago. He has been smoking about 0.10 packs per day. He has never used smokeless tobacco. He reports that he drinks alcohol. He reports that he does not use illicit drugs.  ALLERGIES: Review of patient's allergies indicates no known allergies.  MEDICATIONS:  Current Outpatient Prescriptions  Medication Sig Dispense Refill  . aspirin-sod bicarb-citric acid (ALKA-SELTZER) 325 MG TBEF tablet Take 325 mg by mouth every 6 (six) hours as needed. For cold symptoms    . benzonatate (TESSALON) 100 MG capsule Take 1 capsule (100 mg total) by mouth 3 (three) times daily as needed for cough. 20 capsule 0  . dextromethorphan (DELSYM) 30 MG/5ML liquid Take 30 mg by mouth at bedtime as needed for cough.    Marland Kitchen levofloxacin (LEVAQUIN) 750 MG tablet Take 1 tablet (750 mg total) by mouth daily at 6 PM. (Patient not taking: Reported on 10/06/2015) 5 tablet 0  . prochlorperazine (COMPAZINE) 10 MG tablet Take 1 tablet (10 mg total) by mouth every 6 (six) hours as needed for nausea or vomiting. 30 tablet 0   No current facility-administered medications for this encounter.    REVIEW OF SYSTEMS:  A 15 point review of systems is documented in the electronic medical record. This was obtained by the nursing staff. However, I reviewed this with the patient to  discuss relevant findings and make appropriate changes.  Pertinent items are noted in HPI.   PHYSICAL EXAM:  height is '6\' 1"'$  (1.854 m) and weight is 140 lb 14.4 oz (63.912 kg). His temperature is 98.4 F (36.9 C). His blood pressure is 105/70 and his pulse is 99. His  oxygen saturation is 98%.   _  General: Alert and oriented, in no acute distress HEENT: Head is normocephalic. Extraocular movements are intact. Oropharynx is clear. Poor dentition, with several teeth missing Neck: Neck is supple, no palpable cervical or supraclavicular lymphadenopathy. Heart: Regular in rate and rhythm with no murmurs, rubs, or gallops. Chest: Congestion,wheezing in left upper lobe, mildly decrease breath sounds in left upper chest Abdomen: Soft, nontender, nondistended, with no rigidity or guarding. Extremities: No cyanosis or edema. Lymphatics: see Neck Exam Skin: No concerning lesions. Musculoskeletal: symmetric strength and muscle tone throughout. Neurologic: Cranial nerves II through XII are grossly intact. No obvious focalities. Speech is fluent. Coordination is intact. Psychiatric: Judgment and insight are intact. Affect is appropriate.  ECOG = 1   LABORATORY DATA:  Lab Results  Component Value Date   WBC 5.5 09/28/2015   HGB 13.3 09/28/2015   HCT 41.5 09/28/2015   MCV 91.2 09/28/2015   PLT 326 09/28/2015   NEUTROABS 2.0 09/28/2015   Lab Results  Component Value Date   NA 139 09/28/2015   K 4.6 09/28/2015   CL 103 09/19/2015   CO2 30* 09/28/2015   GLUCOSE 85 09/28/2015   CREATININE 0.9 09/28/2015   CALCIUM 8.8 09/28/2015      RADIOGRAPHY: Dg Chest 2 View  09/08/2015  CLINICAL DATA:  55 year old with cough and chest pain for 1 week. Smoker. EXAM: CHEST  2 VIEW COMPARISON:  None. FINDINGS: There is complete left upper lobe collapse with obscuration of the aortic arch and mediastinal shift to the left. The left lower lobe appears clear. The right lung is clear. There is no pleural effusion or pneumothorax. No foreign bodies are seen. The bones appear unremarkable. IMPRESSION: Complete left upper lobe collapse, worrisome for underlying bronchogenic carcinoma in a smoker. Chest CT with contrast recommended for further evaluation. Electronically Signed    By: Richardean Sale M.D.   On: 09/08/2015 17:21   Ct Chest W Contrast  09/09/2015  CLINICAL DATA:  Chronic cough and chest tenderness. Initial encounter. EXAM: CT CHEST WITH CONTRAST TECHNIQUE: Multidetector CT imaging of the chest was performed during intravenous contrast administration. CONTRAST:  66m OMNIPAQUE IOHEXOL 300 MG/ML  SOLN COMPARISON:  Chest radiograph performed earlier today at 5:23 p.m. FINDINGS: There is complete collapse of the left upper lobe, as previously noted. A lobulated contour is noted extending into the left mainstem bronchus, raising suspicion for an underlying mass measuring approximately 3.2 x 1.5 cm at the left hilum. This may reflect a bronchogenic or endobronchial lesion. No pleural effusion or pneumothorax is seen. No pulmonary nodules are identified within the expanded portions of both lungs. The mediastinum is normal in size. 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 There appears to be an enlarged 1.4 cm periaortic node. No pericardial effusion is identified. The visualized portions of thyroid gland are unremarkable. No axillary lymphadenopathy is seen. The visualized portions of the liver are grossly unremarkable. The heterogeneous appearance of the spleen is nonspecific, without a dominant mass. The visualized portions of the gallbladder, pancreas and adrenal glands are within normal limits. The visualized portions of the kidneys are unremarkable appearance. No acute osseous abnormalities are identified. IMPRESSION:  1. Suspect mass at the left hilum, extending into the left mainstem bronchus, likely measuring approximately 3.2 x 1.5 cm, with associated complete collapse of the left upper lobe. This may reflect a bronchogenic malignancy, or an endobronchial lesion. 2. Enlarged 1.4 cm periaortic node noted. This is concerning for metastatic disease. 3. 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 Electronically Signed   By: Garald Balding M.D.   On: 09/09/2015 00:20    Mr Jeri Cos SJ Contrast  09/13/2015  CLINICAL DATA:  55 year old male with lung mass.  Initial encounter. EXAM: MRI HEAD WITHOUT AND WITH CONTRAST TECHNIQUE: Multiplanar, multiecho pulse sequences of the brain and surrounding structures were obtained without and with intravenous contrast. CONTRAST:  12m MULTIHANCE GADOBENATE DIMEGLUMINE 529 MG/ML IV SOLN COMPARISON:  None. FINDINGS: No acute infarct or intracranial hemorrhage. No intracranial enhancing lesion or bony destructive lesion noted to suggest intracranial metastatic disease. Incidentally noted are pulsation artifact and vessels extending deep into the sulci. Mild nonspecific white matter changes most consistent with result of small vessel disease. Mild parietal lobe atrophy without hydrocephalus. Major intracranial vascular structures are patent. Cervical medullary junction, pituitary region, pineal region and orbital structures unremarkable. Minimal paranasal sinus mucosal thickening. IMPRESSION: No evidence of intracranial metastatic disease. Mild small vessel disease type changes. Mild parietal lobe atrophy. Electronically Signed   By: SGenia DelM.D.   On: 09/13/2015 17:29   Dg Chest Port 1 View  09/15/2015  CLINICAL DATA:  Healthcare associated pneumonia. EXAM: PORTABLE CHEST 1 VIEW COMPARISON:  Multiple exams, including 09/11/2015 FINDINGS: Continued complete opacification of the left upper lobe with a component of volume loss, shown to likely be due to a bronchial mass on prior CT. The right lung remains clear. Heart size normal. Left upper mediastinum obscured by the left upper lobe airspace opacity. Overall no change from 09/11/2015. IMPRESSION: 1. Stable volume loss and airspace opacity in the left upper lobe obscuring the left upper mediastinum. No change from 09/11/2015. Electronically Signed   By: WVan ClinesM.D.   On: 09/15/2015 14:31   Dg Chest Port 1 View  09/12/2015  CLINICAL DATA:  Acute onset of fever and shortness  of breath. Initial encounter. EXAM: PORTABLE CHEST 1 VIEW COMPARISON:  Chest radiograph and CT of the chest performed 09/08/2015 FINDINGS: Left upper lobe collapse is again noted, somewhat more dense than on the prior study. As before, this appeared to reflect a mass at the left hilum on the prior CT, with associated postobstructive pneumonia. No pleural effusion or pneumothorax is seen. The cardiomediastinal silhouette is within normal limits. No acute osseous abnormalities are seen. IMPRESSION: Left upper lobe collapse again noted. This reflects the apparent mass at the left hilum as noted on recent prior CT, with associated postobstructive pneumonia. Electronically Signed   By: JGarald BaldingM.D.   On: 09/12/2015 01:46     IMPRESSION: The patient has been diagnosed with questionable stage IIIA (T2a, N2, M0) non-small cell lung cancer, squamous cell carcinoma. There is a mass obstructing the left upper lobe of his lung causing airspace/volume loss. The patient is likely to benefit from concurrent chemo-radiation treatment to this mass.     PLAN:We discussed the possible side effects and risks of treatment in addition to the possible benefits of treatment. We discussed the protocol for radiation treatment.  All of the patient's questions were answered. The patient does wish to proceed with this treatment. A simulation will be scheduled such that we can proceed with treatment planning.  The patient has signed an informed consent.  Simulation scheduled for 10/07/15 at 10 am. That scan is scheduled for tomorrow. Final dose to the chest will depend on results of PET scan. Treatments to begin next week concomitant with radiosensitizing chemotherapy   ------------------------------------------------  [name], PhD, MD   This document serves as a record of services personally performed by Gery Pray, MD. It was created on his behalf by Derek Mound, a trained medical scribe. The creation of this  record is based on the scribe's personal observations and the provider's statements to them. This document has been checked and approved by the attending provider.

## 2015-10-06 NOTE — Progress Notes (Signed)
Please see the Nurse Progress Note in the MD Initial Consult Encounter for this patient. 

## 2015-10-06 NOTE — Progress Notes (Signed)
Thoracic Location of Tumor / Histology: Questionable stage IIIA (T2a, N2, M0) non-small cell lung cancer, squamous cell carcinoma diagnosed in February 2017 and presented with left upper lobe obstructing mass as well as questionable periaortic lymphadenopathy.  Patient presented with symptoms of: He reports shortness of breath and loud breathing. He also had pneumonia. He reports constant coughing.  Biopsies revealed:   09/09/15 Diagnosis Endobronchial biopsy, left mainstem bronchus POSITIVE FOR SQUAMOUS CELL CARCINOMA.  Tobacco/Marijuana/Snuff/ETOH use: He reports smoking 1-2 cigarettes daily. He reports he is going to stop, but doesn't give a definite time frame.   Past/Anticipated interventions by cardiothoracic surgery, if any: 09/09/15 -Diagnosis Endobronchial biopsy, left mainstem bronchus POSITIVE FOR SQUAMOUS CELL CARCINOMA.   Past/Anticipated interventions by medical oncology, if any: He saw [name]. The plan is for a PET scan 10/07/15. He will start chemotherapy on 10/10/15.  Signs/Symptoms Weight changes, if any:  Wt Readings from Last 3 Encounters:  10/06/15 140 lb 14.4 oz (63.912 kg)  09/28/15 140 lb 9.6 oz (63.776 kg)  09/09/15 131 lb (59.421 kg)    Respiratory complaints, if any: He reports shortness of breath with exertion. He occasionally can hear his breathing with activity and when talking a long time.   Hemoptysis, if any: No  Pain issues, if any:  No  SAFETY ISSUES:  Prior radiation? No  Pacemaker/ICD? No  Possible current pregnancy? N/A  Is the patient on methotrexate? No  Current Complaints / other details:  He report some difficulty with constipation recently.  BP 105/70 mmHg  Pulse 99  Temp(Src) 98.4 F (36.9 C)  Ht '6\' 1"'$  (1.854 m)  Wt 140 lb 14.4 oz (63.912 kg)  BMI 18.59 kg/m2  SpO2 98%

## 2015-10-06 NOTE — Addendum Note (Signed)
Encounter addended by: Ernst Spell, RN on: 10/06/2015  1:44 PM<BR>     Documentation filed: Charges VN

## 2015-10-07 ENCOUNTER — Ambulatory Visit (HOSPITAL_COMMUNITY): Admission: RE | Admit: 2015-10-07 | Payer: BLUE CROSS/BLUE SHIELD | Source: Ambulatory Visit

## 2015-10-07 ENCOUNTER — Other Ambulatory Visit: Payer: Self-pay

## 2015-10-07 ENCOUNTER — Ambulatory Visit
Admission: RE | Admit: 2015-10-07 | Discharge: 2015-10-07 | Disposition: A | Discharge status: Home / Self Care | Payer: BLUE CROSS/BLUE SHIELD | Source: Ambulatory Visit | Attending: Radiation Oncology | Admitting: Radiation Oncology

## 2015-10-07 ENCOUNTER — Other Ambulatory Visit: Payer: Self-pay | Admitting: *Deleted

## 2015-10-07 DIAGNOSIS — C3412 Malignant neoplasm of upper lobe, left bronchus or lung: Secondary | ICD-10-CM

## 2015-10-07 DIAGNOSIS — Z510 Encounter for antineoplastic radiation therapy: Secondary | ICD-10-CM | POA: Diagnosis not present

## 2015-10-10 ENCOUNTER — Ambulatory Visit (HOSPITAL_BASED_OUTPATIENT_CLINIC_OR_DEPARTMENT_OTHER): Payer: BLUE CROSS/BLUE SHIELD

## 2015-10-10 ENCOUNTER — Ambulatory Visit: Payer: BLUE CROSS/BLUE SHIELD | Admitting: Nutrition

## 2015-10-10 ENCOUNTER — Other Ambulatory Visit (HOSPITAL_BASED_OUTPATIENT_CLINIC_OR_DEPARTMENT_OTHER): Payer: BLUE CROSS/BLUE SHIELD

## 2015-10-10 ENCOUNTER — Ambulatory Visit: Payer: BLUE CROSS/BLUE SHIELD | Admitting: Internal Medicine

## 2015-10-10 VITALS — BP 116/81 | HR 51 | Temp 98.2°F | Resp 16

## 2015-10-10 DIAGNOSIS — C3412 Malignant neoplasm of upper lobe, left bronchus or lung: Secondary | ICD-10-CM

## 2015-10-10 DIAGNOSIS — Z5111 Encounter for antineoplastic chemotherapy: Secondary | ICD-10-CM

## 2015-10-10 LAB — COMPREHENSIVE METABOLIC PANEL
ALT: 47 U/L (ref 0–55)
AST: 27 U/L (ref 5–34)
Albumin: 3.2 g/dL — ABNORMAL LOW (ref 3.5–5.0)
Alkaline Phosphatase: 124 U/L (ref 40–150)
Anion Gap: 9 mEq/L (ref 3–11)
BUN: 14.1 mg/dL (ref 7.0–26.0)
CHLORIDE: 106 meq/L (ref 98–109)
CO2: 27 meq/L (ref 22–29)
Calcium: 8.9 mg/dL (ref 8.4–10.4)
Creatinine: 1 mg/dL (ref 0.7–1.3)
Glucose: 74 mg/dl (ref 70–140)
POTASSIUM: 4 meq/L (ref 3.5–5.1)
Sodium: 141 mEq/L (ref 136–145)
Total Protein: 7.9 g/dL (ref 6.4–8.3)

## 2015-10-10 LAB — CBC WITH DIFFERENTIAL/PLATELET
BASO%: 0.6 % (ref 0.0–2.0)
Basophils Absolute: 0 10*3/uL (ref 0.0–0.1)
EOS ABS: 0.4 10*3/uL (ref 0.0–0.5)
EOS%: 7.9 % — ABNORMAL HIGH (ref 0.0–7.0)
HCT: 43.6 % (ref 38.4–49.9)
HGB: 14.3 g/dL (ref 13.0–17.1)
LYMPH%: 60.9 % — AB (ref 14.0–49.0)
MCH: 29.4 pg (ref 27.2–33.4)
MCHC: 32.9 g/dL (ref 32.0–36.0)
MCV: 89.4 fL (ref 79.3–98.0)
MONO#: 0.6 10*3/uL (ref 0.1–0.9)
MONO%: 13.3 % (ref 0.0–14.0)
NEUT#: 0.8 10*3/uL — ABNORMAL LOW (ref 1.5–6.5)
NEUT%: 17.3 % — AB (ref 39.0–75.0)
PLATELETS: 219 10*3/uL (ref 140–400)
RBC: 4.88 10*6/uL (ref 4.20–5.82)
RDW: 13.6 % (ref 11.0–14.6)
WBC: 4.9 10*3/uL (ref 4.0–10.3)
lymph#: 3 10*3/uL (ref 0.9–3.3)

## 2015-10-10 MED ORDER — PACLITAXEL CHEMO INJECTION 300 MG/50ML
45.0000 mg/m2 | Freq: Once | INTRAVENOUS | Status: AC
  Administered 2015-10-10: 84 mg via INTRAVENOUS
  Filled 2015-10-10: qty 14

## 2015-10-10 MED ORDER — PALONOSETRON HCL INJECTION 0.25 MG/5ML
0.2500 mg | Freq: Once | INTRAVENOUS | Status: AC
  Administered 2015-10-10: 0.25 mg via INTRAVENOUS

## 2015-10-10 MED ORDER — SODIUM CHLORIDE 0.9 % IV SOLN
200.0000 mg | Freq: Once | INTRAVENOUS | Status: AC
  Administered 2015-10-10: 200 mg via INTRAVENOUS
  Filled 2015-10-10: qty 20

## 2015-10-10 MED ORDER — FAMOTIDINE IN NACL 20-0.9 MG/50ML-% IV SOLN
20.0000 mg | Freq: Once | INTRAVENOUS | Status: AC
  Administered 2015-10-10: 20 mg via INTRAVENOUS

## 2015-10-10 MED ORDER — SODIUM CHLORIDE 0.9 % IV SOLN
20.0000 mg | Freq: Once | INTRAVENOUS | Status: AC
  Administered 2015-10-10: 20 mg via INTRAVENOUS
  Filled 2015-10-10: qty 2

## 2015-10-10 MED ORDER — DIPHENHYDRAMINE HCL 50 MG/ML IJ SOLN
50.0000 mg | Freq: Once | INTRAMUSCULAR | Status: AC
  Administered 2015-10-10: 50 mg via INTRAVENOUS

## 2015-10-10 MED ORDER — DIPHENHYDRAMINE HCL 50 MG/ML IJ SOLN
INTRAMUSCULAR | Status: AC
  Filled 2015-10-10: qty 1

## 2015-10-10 MED ORDER — PALONOSETRON HCL INJECTION 0.25 MG/5ML
INTRAVENOUS | Status: AC
  Filled 2015-10-10: qty 5

## 2015-10-10 MED ORDER — SODIUM CHLORIDE 0.9 % IV SOLN
Freq: Once | INTRAVENOUS | Status: AC
  Administered 2015-10-10: 11:00:00 via INTRAVENOUS

## 2015-10-10 MED ORDER — FAMOTIDINE IN NACL 20-0.9 MG/50ML-% IV SOLN
INTRAVENOUS | Status: AC
  Filled 2015-10-10: qty 50

## 2015-10-10 NOTE — Progress Notes (Signed)
  Radiation Oncology         [phone] ________________________________  Name: Maurice Mcknight MRN: [id number]  Date: 10/07/2015  DOB: Sep 10, 1960  SIMULATION AND TREATMENT PLANNING NOTE    ICD-9-CM ICD-10-CM   1. Malignant neoplasm of upper lobe of left lung (HCC) 162.3 C34.12     DIAGNOSIS:  stage IIIA (T2a, N2, M0) non-small cell lung cancer, squamous cell carcinoma diagnosed in February 2017 and presented with left upper lobe obstructing mass as well as questionable periaortic lymphadenopathy (PET scan pending)  NARRATIVE:  The patient was brought to the [place].  Identity was confirmed.  All relevant records and images related to the planned course of therapy were reviewed.  The patient freely provided informed written consent to proceed with treatment after reviewing the details related to the planned course of therapy. The consent form was witnessed and verified by the simulation staff.  Then, the patient was set-up in a stable reproducible  supine position for radiation therapy.  CT images were obtained.  Surface markings were placed.  The CT images were loaded into the planning software.  Then the target and avoidance structures were contoured.  Treatment planning then occurred.  The radiation prescription was entered and confirmed.  Then, I designed and supervised the construction of a total of 5 medically necessary complex treatment devices.  I have requested : 3D Simulation  I have requested a DVH of the following structures: GTV, PTV, heart, lungs, esophagus.  I have ordered:dose calc.  PLAN:  The patient will receive 60 Gy in 30 fractions Along with radiosensitizing chemotherapy. ________________________________  Special treatment procedure note   Patient will be receiving radiosensitizing chemotherapy throughout its course of chest radiation treatment. Given the increased potential toxicities as well as the necessity for close monitoring of the patient and  bloodwork, this constitutes a special treatment procedure -----------------------------------  [name], PhD, MD

## 2015-10-10 NOTE — Progress Notes (Signed)
55 year old male diagnosed with lung cancer receiving concurrent chemoradiation therapy.  He is a patient of [name].  Past medical history includes tobacco and alcohol usage, malnutrition.  Medications include Compazine.  Labs include albumin 3.0 on March 15.  Height: 6 feet 1 inch. Weight: 140.9 pounds. Usual body weight: 165-170 pounds. BMI: 18.59  Patient is very sleepy during treatment. He denies nausea, vomiting, diarrhea, and constipation. Patient endorses 25-30 pound weight loss  Nutrition diagnosis:  Unintended weight loss related to inadequate oral intake as evidenced by 25 pound weight loss from usual body weight.  Intervention:  Educated patient on the importance of small frequent meals and snacks consisting of high-calorie, high-protein foods. Provided fact sheet on increasing calories and protein. Reviewed importance of adequate intake to promote healing and increase quality-of-life. Teach back method was used.  Monitoring, evaluation, goals: Patient will have minimal weight loss and increase calories and protein for increase quality-of-life.  Next visit: Monday, April 17, during infusion.  **Disclaimer: This note was dictated with voice recognition software. Similar sounding words can inadvertently be transcribed and this note may contain transcription errors which may not have been corrected upon publication of note.**

## 2015-10-10 NOTE — Patient Instructions (Signed)
[facility] Discharge Instructions for Patients Receiving Chemotherapy  Today you received the following chemotherapy agents:  Taxol and Carboplatin.  To help prevent nausea and vomiting after your treatment, we encourage you to take your nausea medication as prescribed.   If you develop nausea and vomiting that is not controlled by your nausea medication, call the clinic.   BELOW ARE SYMPTOMS THAT SHOULD BE REPORTED IMMEDIATELY:  *FEVER GREATER THAN 100.5 F  *CHILLS WITH OR WITHOUT FEVER  NAUSEA AND VOMITING THAT IS NOT CONTROLLED WITH YOUR NAUSEA MEDICATION  *UNUSUAL SHORTNESS OF BREATH  *UNUSUAL BRUISING OR BLEEDING  TENDERNESS IN MOUTH AND THROAT WITH OR WITHOUT PRESENCE OF ULCERS  *URINARY PROBLEMS  *BOWEL PROBLEMS  UNUSUAL RASH Items with * indicate a potential emergency and should be followed up as soon as possible.  Feel free to call the clinic you have any questions or concerns. The clinic phone number is [phone].  Please show the [place] at check-in to the Emergency Department and triage nurse.  Carboplatin injection What is this medicine? CARBOPLATIN (KAR boe pla tin) is a chemotherapy drug. It targets fast dividing cells, like cancer cells, and causes these cells to die. This medicine is used to treat ovarian cancer and many other cancers. This medicine may be used for other purposes; ask your health care provider or pharmacist if you have questions. What should I tell my health care provider before I take this medicine? They need to know if you have any of these conditions: -blood disorders -hearing problems -kidney disease -recent or ongoing radiation therapy -an unusual or allergic reaction to carboplatin, cisplatin, other chemotherapy, other medicines, foods, dyes, or preservatives -pregnant or trying to get pregnant -breast-feeding How should I use this medicine? This drug is usually given as an infusion into a vein. It is  administered in a hospital or clinic by a specially trained health care professional. Talk to your pediatrician regarding the use of this medicine in children. Special care may be needed. Overdosage: If you think you have taken too much of this medicine contact a poison control center or emergency room at once. NOTE: This medicine is only for you. Do not share this medicine with others. What if I miss a dose? It is important not to miss a dose. Call your doctor or health care professional if you are unable to keep an appointment. What may interact with this medicine? -medicines for seizures -medicines to increase blood counts like filgrastim, pegfilgrastim, sargramostim -some antibiotics like amikacin, gentamicin, neomycin, streptomycin, tobramycin -vaccines Talk to your doctor or health care professional before taking any of these medicines: -acetaminophen -aspirin -ibuprofen -ketoprofen -naproxen This list may not describe all possible interactions. Give your health care provider a list of all the medicines, herbs, non-prescription drugs, or dietary supplements you use. Also tell them if you smoke, drink alcohol, or use illegal drugs. Some items may interact with your medicine. What should I watch for while using this medicine? Your condition will be monitored carefully while you are receiving this medicine. You will need important blood work done while you are taking this medicine. This drug may make you feel generally unwell. This is not uncommon, as chemotherapy can affect healthy cells as well as cancer cells. Report any side effects. Continue your course of treatment even though you feel ill unless your doctor tells you to stop. In some cases, you may be given additional medicines to help with side effects. Follow all directions for  their use. Call your doctor or health care professional for advice if you get a fever, chills or sore throat, or other symptoms of a cold or flu. Do not  treat yourself. This drug decreases your body's ability to fight infections. Try to avoid being around people who are sick. This medicine may increase your risk to bruise or bleed. Call your doctor or health care professional if you notice any unusual bleeding. Be careful brushing and flossing your teeth or using a toothpick because you may get an infection or bleed more easily. If you have any dental work done, tell your dentist you are receiving this medicine. Avoid taking products that contain aspirin, acetaminophen, ibuprofen, naproxen, or ketoprofen unless instructed by your doctor. These medicines may hide a fever. Do not become pregnant while taking this medicine. Women should inform their doctor if they wish to become pregnant or think they might be pregnant. There is a potential for serious side effects to an unborn child. Talk to your health care professional or pharmacist for more information. Do not breast-feed an infant while taking this medicine. What side effects may I notice from receiving this medicine? Side effects that you should report to your doctor or health care professional as soon as possible: -allergic reactions like skin rash, itching or hives, swelling of the face, lips, or tongue -signs of infection - fever or chills, cough, sore throat, pain or difficulty passing urine -signs of decreased platelets or bleeding - bruising, pinpoint red spots on the skin, black, tarry stools, nosebleeds -signs of decreased red blood cells - unusually weak or tired, fainting spells, lightheadedness -breathing problems -changes in hearing -changes in vision -chest pain -high blood pressure -low blood counts - This drug may decrease the number of white blood cells, red blood cells and platelets. You may be at increased risk for infections and bleeding. -nausea and vomiting -pain, swelling, redness or irritation at the injection site -pain, tingling, numbness in the hands or feet -problems  with balance, talking, walking -trouble passing urine or change in the amount of urine Side effects that usually do not require medical attention (report to your doctor or health care professional if they continue or are bothersome): -hair loss -loss of appetite -metallic taste in the mouth or changes in taste This list may not describe all possible side effects. Call your doctor for medical advice about side effects. You may report side effects to FDA at 1-800-FDA-1088. Where should I keep my medicine? This drug is given in a hospital or clinic and will not be stored at home. NOTE: This sheet is a summary. It may not cover all possible information. If you have questions about this medicine, talk to your doctor, pharmacist, or health care provider.    2016, Elsevier/Gold Standard. (2007-10-07 14:38:05) Paclitaxel injection What is this medicine? PACLITAXEL (PAK li TAX el) is a chemotherapy drug. It targets fast dividing cells, like cancer cells, and causes these cells to die. This medicine is used to treat ovarian cancer, breast cancer, and other cancers. This medicine may be used for other purposes; ask your health care provider or pharmacist if you have questions. What should I tell my health care provider before I take this medicine? They need to know if you have any of these conditions: -blood disorders -irregular heartbeat -infection (especially a virus infection such as chickenpox, cold sores, or herpes) -liver disease -previous or ongoing radiation therapy -an unusual or allergic reaction to paclitaxel, alcohol, polyoxyethylated castor oil, other chemotherapy  agents, other medicines, foods, dyes, or preservatives -pregnant or trying to get pregnant -breast-feeding How should I use this medicine? This drug is given as an infusion into a vein. It is administered in a hospital or clinic by a specially trained health care professional. Talk to your pediatrician regarding the use of  this medicine in children. Special care may be needed. Overdosage: If you think you have taken too much of this medicine contact a poison control center or emergency room at once. NOTE: This medicine is only for you. Do not share this medicine with others. What if I miss a dose? It is important not to miss your dose. Call your doctor or health care professional if you are unable to keep an appointment. What may interact with this medicine? Do not take this medicine with any of the following medications: -disulfiram -metronidazole This medicine may also interact with the following medications: -cyclosporine -diazepam -ketoconazole -medicines to increase blood counts like filgrastim, pegfilgrastim, sargramostim -other chemotherapy drugs like cisplatin, doxorubicin, epirubicin, etoposide, teniposide, vincristine -quinidine -testosterone -vaccines -verapamil Talk to your doctor or health care professional before taking any of these medicines: -acetaminophen -aspirin -ibuprofen -ketoprofen -naproxen This list may not describe all possible interactions. Give your health care provider a list of all the medicines, herbs, non-prescription drugs, or dietary supplements you use. Also tell them if you smoke, drink alcohol, or use illegal drugs. Some items may interact with your medicine. What should I watch for while using this medicine? Your condition will be monitored carefully while you are receiving this medicine. You will need important blood work done while you are taking this medicine. This drug may make you feel generally unwell. This is not uncommon, as chemotherapy can affect healthy cells as well as cancer cells. Report any side effects. Continue your course of treatment even though you feel ill unless your doctor tells you to stop. This medicine can cause serious allergic reactions. To reduce your risk you will need to take other medicine(s) before treatment with this medicine. In some  cases, you may be given additional medicines to help with side effects. Follow all directions for their use. Call your doctor or health care professional for advice if you get a fever, chills or sore throat, or other symptoms of a cold or flu. Do not treat yourself. This drug decreases your body's ability to fight infections. Try to avoid being around people who are sick. This medicine may increase your risk to bruise or bleed. Call your doctor or health care professional if you notice any unusual bleeding. Be careful brushing and flossing your teeth or using a toothpick because you may get an infection or bleed more easily. If you have any dental work done, tell your dentist you are receiving this medicine. Avoid taking products that contain aspirin, acetaminophen, ibuprofen, naproxen, or ketoprofen unless instructed by your doctor. These medicines may hide a fever. Do not become pregnant while taking this medicine. Women should inform their doctor if they wish to become pregnant or think they might be pregnant. There is a potential for serious side effects to an unborn child. Talk to your health care professional or pharmacist for more information. Do not breast-feed an infant while taking this medicine. Men are advised not to father a child while receiving this medicine. This product may contain alcohol. Ask your pharmacist or healthcare provider if this medicine contains alcohol. Be sure to tell all healthcare providers you are taking this medicine. Certain medicines, like metronidazole and  disulfiram, can cause an unpleasant reaction when taken with alcohol. The reaction includes flushing, headache, nausea, vomiting, sweating, and increased thirst. The reaction can last from 30 minutes to several hours. What side effects may I notice from receiving this medicine? Side effects that you should report to your doctor or health care professional as soon as possible: -allergic reactions like skin rash,  itching or hives, swelling of the face, lips, or tongue -low blood counts - This drug may decrease the number of white blood cells, red blood cells and platelets. You may be at increased risk for infections and bleeding. -signs of infection - fever or chills, cough, sore throat, pain or difficulty passing urine -signs of decreased platelets or bleeding - bruising, pinpoint red spots on the skin, black, tarry stools, nosebleeds -signs of decreased red blood cells - unusually weak or tired, fainting spells, lightheadedness -breathing problems -chest pain -high or low blood pressure -mouth sores -nausea and vomiting -pain, swelling, redness or irritation at the injection site -pain, tingling, numbness in the hands or feet -slow or irregular heartbeat -swelling of the ankle, feet, hands Side effects that usually do not require medical attention (report to your doctor or health care professional if they continue or are bothersome): -bone pain -complete hair loss including hair on your head, underarms, pubic hair, eyebrows, and eyelashes -changes in the color of fingernails -diarrhea -loosening of the fingernails -loss of appetite -muscle or joint pain -red flush to skin -sweating This list may not describe all possible side effects. Call your doctor for medical advice about side effects. You may report side effects to FDA at 1-800-FDA-1088. Where should I keep my medicine? This drug is given in a hospital or clinic and will not be stored at home. NOTE: This sheet is a summary. It may not cover all possible information. If you have questions about this medicine, talk to your doctor, pharmacist, or health care provider.    2016, Elsevier/Gold Standard. (2015-02-17 13:02:56)

## 2015-10-10 NOTE — Progress Notes (Signed)
Per [name], it's OK to treat with an Solomon of 0.8.

## 2015-10-11 ENCOUNTER — Telehealth: Payer: Self-pay | Admitting: Medical Oncology

## 2015-10-11 ENCOUNTER — Ambulatory Visit
Admission: RE | Admit: 2015-10-11 | Discharge: 2015-10-11 | Disposition: A | Discharge status: Home / Self Care | Payer: BLUE CROSS/BLUE SHIELD | Source: Ambulatory Visit | Attending: Radiation Oncology | Admitting: Radiation Oncology

## 2015-10-11 ENCOUNTER — Ambulatory Visit: Payer: BLUE CROSS/BLUE SHIELD | Admitting: Radiation Oncology

## 2015-10-11 DIAGNOSIS — Z510 Encounter for antineoplastic radiation therapy: Secondary | ICD-10-CM | POA: Diagnosis not present

## 2015-10-11 DIAGNOSIS — C3412 Malignant neoplasm of upper lobe, left bronchus or lung: Secondary | ICD-10-CM

## 2015-10-11 NOTE — Telephone Encounter (Signed)
Attempted to contact pt for chemo f/u -no answer , no voice mail.

## 2015-10-11 NOTE — Progress Notes (Signed)
  Radiation Oncology         [phone] ________________________________  Name: Maurice Mcknight MRN: [id number]  Date: 10/11/2015  DOB: [birth year]  Simulation Verification Note    ICD-9-CM ICD-10-CM   1. Malignant neoplasm of upper lobe of left lung (HCC) 162.3 C34.12     Status: outpatient  NARRATIVE: The patient was brought to the treatment unit and placed in the planned treatment position. The clinical setup was verified. Then port films were obtained and uploaded to the radiation oncology medical record software.  The treatment beams were carefully compared against the planned radiation fields. The position location and shape of the radiation fields was reviewed. They targeted volume of tissue appears to be appropriately covered by the radiation beams. Organs at risk appear to be excluded as planned.  Based on my personal review, I approved the simulation verification. The patient's treatment will proceed as planned.  -----------------------------------  [name], PhD, MD

## 2015-10-12 ENCOUNTER — Ambulatory Visit: Admission: RE | Admit: 2015-10-12 | Payer: BLUE CROSS/BLUE SHIELD | Source: Ambulatory Visit

## 2015-10-12 ENCOUNTER — Ambulatory Visit
Admission: RE | Admit: 2015-10-12 | Discharge: 2015-10-12 | Disposition: A | Discharge status: Home / Self Care | Payer: BLUE CROSS/BLUE SHIELD | Source: Ambulatory Visit | Attending: Radiation Oncology | Admitting: Radiation Oncology

## 2015-10-13 ENCOUNTER — Ambulatory Visit
Admission: RE | Admit: 2015-10-13 | Discharge: 2015-10-13 | Disposition: A | Discharge status: Home / Self Care | Payer: BLUE CROSS/BLUE SHIELD | Source: Ambulatory Visit | Attending: Radiation Oncology | Admitting: Radiation Oncology

## 2015-10-13 ENCOUNTER — Ambulatory Visit: Payer: BLUE CROSS/BLUE SHIELD

## 2015-10-13 DIAGNOSIS — C3412 Malignant neoplasm of upper lobe, left bronchus or lung: Secondary | ICD-10-CM

## 2015-10-13 DIAGNOSIS — Z510 Encounter for antineoplastic radiation therapy: Secondary | ICD-10-CM | POA: Diagnosis not present

## 2015-10-13 NOTE — Progress Notes (Signed)
  Radiation Oncology         [phone] ________________________________  Name: Maurice Mcknight MRN: [id number]  Date: 10/13/2015  DOB: 05/20/1961  SIMULATION AND TREATMENT PLANNING NOTE    ICD-9-CM ICD-10-CM   1. Malignant neoplasm of upper lobe of left lung (HCC) 162.3 C34.12     DIAGNOSIS:  stage IIIA (T2a, N2, M0) non-small cell lung cancer, squamous cell carcinoma diagnosed in February 2017 and presented with left upper lobe obstructing mass as well as questionable periaortic lymphadenopathy (PET scan pending)  NARRATIVE:  The patient was brought to the [facility].  Identity was confirmed.  All relevant records and images related to the planned course of therapy were reviewed.  The patient freely provided informed written consent to proceed with treatment after reviewing the details related to the planned course of therapy. The consent form was witnessed and verified by the simulation staff.  Then, the patient was set-up in a stable reproducible  supine position for radiation therapy.  CT images were obtained.  Surface markings were placed.  The CT images were loaded into the planning software.  Then GTV, PTV, heart, lungs, esophagusthe target and avoidance structures were contoured.  Treatment planning then occurred.  The radiation prescription was entered and confirmed.  Then, I designed and supervised the construction of a total of 5 medically necessary complex treatment devices.  I have requested : 3D Simulation I have requested a DVH of the following structures: GTV, PTV, heart, lungs, esophagus. I have ordered:dose calc.   PLAN:  The patient will receive 60 Gy in 30 fractions.  Patient underwent re-simulation as he was unable to tolerate the previous position in light of right shoulder pain. The patient did have a custom vac lock immobilization to support his arms in the treatment position today area.  He will undergo replanning in light of position change thru The  treatment area.  ________________________________  -----------------------------------  [name], PhD, MD

## 2015-10-14 ENCOUNTER — Other Ambulatory Visit: Payer: Self-pay | Admitting: Medical Oncology

## 2015-10-14 ENCOUNTER — Ambulatory Visit: Payer: BLUE CROSS/BLUE SHIELD

## 2015-10-14 ENCOUNTER — Ambulatory Visit: Payer: BLUE CROSS/BLUE SHIELD | Admitting: Radiation Oncology

## 2015-10-14 DIAGNOSIS — C3412 Malignant neoplasm of upper lobe, left bronchus or lung: Secondary | ICD-10-CM

## 2015-10-17 ENCOUNTER — Ambulatory Visit (HOSPITAL_BASED_OUTPATIENT_CLINIC_OR_DEPARTMENT_OTHER): Payer: BLUE CROSS/BLUE SHIELD

## 2015-10-17 ENCOUNTER — Ambulatory Visit
Admission: RE | Admit: 2015-10-17 | Discharge: 2015-10-17 | Disposition: A | Discharge status: Home / Self Care | Payer: BLUE CROSS/BLUE SHIELD | Source: Ambulatory Visit | Attending: Radiation Oncology | Admitting: Radiation Oncology

## 2015-10-17 ENCOUNTER — Ambulatory Visit (HOSPITAL_BASED_OUTPATIENT_CLINIC_OR_DEPARTMENT_OTHER): Payer: BLUE CROSS/BLUE SHIELD | Admitting: Internal Medicine

## 2015-10-17 ENCOUNTER — Encounter: Payer: Self-pay | Admitting: *Deleted

## 2015-10-17 ENCOUNTER — Telehealth: Payer: Self-pay | Admitting: Internal Medicine

## 2015-10-17 ENCOUNTER — Other Ambulatory Visit (HOSPITAL_BASED_OUTPATIENT_CLINIC_OR_DEPARTMENT_OTHER): Payer: BLUE CROSS/BLUE SHIELD

## 2015-10-17 ENCOUNTER — Encounter: Payer: Self-pay | Admitting: Internal Medicine

## 2015-10-17 VITALS — BP 108/69 | HR 63 | Temp 97.8°F | Resp 18 | Ht 73.0 in | Wt 137.9 lb

## 2015-10-17 DIAGNOSIS — R0609 Other forms of dyspnea: Secondary | ICD-10-CM | POA: Diagnosis not present

## 2015-10-17 DIAGNOSIS — Z5111 Encounter for antineoplastic chemotherapy: Secondary | ICD-10-CM | POA: Diagnosis not present

## 2015-10-17 DIAGNOSIS — Z720 Tobacco use: Secondary | ICD-10-CM | POA: Diagnosis not present

## 2015-10-17 DIAGNOSIS — R05 Cough: Secondary | ICD-10-CM | POA: Diagnosis not present

## 2015-10-17 DIAGNOSIS — C3412 Malignant neoplasm of upper lobe, left bronchus or lung: Secondary | ICD-10-CM | POA: Diagnosis not present

## 2015-10-17 DIAGNOSIS — Z510 Encounter for antineoplastic radiation therapy: Secondary | ICD-10-CM | POA: Diagnosis not present

## 2015-10-17 HISTORY — DX: Encounter for antineoplastic chemotherapy: Z51.11

## 2015-10-17 LAB — COMPREHENSIVE METABOLIC PANEL
ALK PHOS: 95 U/L (ref 40–150)
ALT: 21 U/L (ref 0–55)
AST: 17 U/L (ref 5–34)
Albumin: 3.3 g/dL — ABNORMAL LOW (ref 3.5–5.0)
Anion Gap: 6 mEq/L (ref 3–11)
BUN: 12.8 mg/dL (ref 7.0–26.0)
CALCIUM: 9 mg/dL (ref 8.4–10.4)
CHLORIDE: 106 meq/L (ref 98–109)
CO2: 26 mEq/L (ref 22–29)
Creatinine: 0.9 mg/dL (ref 0.7–1.3)
GLUCOSE: 56 mg/dL — AB (ref 70–140)
POTASSIUM: 3.9 meq/L (ref 3.5–5.1)
SODIUM: 139 meq/L (ref 136–145)
Total Bilirubin: 0.51 mg/dL (ref 0.20–1.20)
Total Protein: 7.5 g/dL (ref 6.4–8.3)

## 2015-10-17 LAB — CBC WITH DIFFERENTIAL/PLATELET
BASO%: 1.3 % (ref 0.0–2.0)
BASOS ABS: 0.1 10*3/uL (ref 0.0–0.1)
EOS ABS: 0.2 10*3/uL (ref 0.0–0.5)
EOS%: 5.8 % (ref 0.0–7.0)
HCT: 41.1 % (ref 38.4–49.9)
HGB: 13.4 g/dL (ref 13.0–17.1)
LYMPH%: 54.6 % — ABNORMAL HIGH (ref 14.0–49.0)
MCH: 29 pg (ref 27.2–33.4)
MCHC: 32.6 g/dL (ref 32.0–36.0)
MCV: 88.8 fL (ref 79.3–98.0)
MONO#: 0.3 10*3/uL (ref 0.1–0.9)
MONO%: 7.2 % (ref 0.0–14.0)
NEUT#: 1.2 10*3/uL — ABNORMAL LOW (ref 1.5–6.5)
NEUT%: 31.1 % — AB (ref 39.0–75.0)
Platelets: 221 10*3/uL (ref 140–400)
RBC: 4.62 10*6/uL (ref 4.20–5.82)
RDW: 14 % (ref 11.0–14.6)
WBC: 4 10*3/uL (ref 4.0–10.3)
lymph#: 2.2 10*3/uL (ref 0.9–3.3)

## 2015-10-17 MED ORDER — DIPHENHYDRAMINE HCL 50 MG/ML IJ SOLN
50.0000 mg | Freq: Once | INTRAMUSCULAR | Status: AC
  Administered 2015-10-17: 50 mg via INTRAVENOUS

## 2015-10-17 MED ORDER — SODIUM CHLORIDE 0.9 % IV SOLN
45.0000 mg/m2 | Freq: Once | INTRAVENOUS | Status: AC
  Administered 2015-10-17: 84 mg via INTRAVENOUS
  Filled 2015-10-17: qty 14

## 2015-10-17 MED ORDER — DIPHENHYDRAMINE HCL 50 MG/ML IJ SOLN
INTRAMUSCULAR | Status: AC
  Filled 2015-10-17: qty 1

## 2015-10-17 MED ORDER — SODIUM CHLORIDE 0.9 % IV SOLN
215.4000 mg | Freq: Once | INTRAVENOUS | Status: AC
  Administered 2015-10-17: 220 mg via INTRAVENOUS
  Filled 2015-10-17: qty 22

## 2015-10-17 MED ORDER — PALONOSETRON HCL INJECTION 0.25 MG/5ML
0.2500 mg | Freq: Once | INTRAVENOUS | Status: AC
  Administered 2015-10-17: 0.25 mg via INTRAVENOUS

## 2015-10-17 MED ORDER — SODIUM CHLORIDE 0.9 % IV SOLN
20.0000 mg | Freq: Once | INTRAVENOUS | Status: AC
  Administered 2015-10-17: 20 mg via INTRAVENOUS
  Filled 2015-10-17: qty 2

## 2015-10-17 MED ORDER — FAMOTIDINE IN NACL 20-0.9 MG/50ML-% IV SOLN
INTRAVENOUS | Status: AC
  Filled 2015-10-17: qty 50

## 2015-10-17 MED ORDER — PALONOSETRON HCL INJECTION 0.25 MG/5ML
INTRAVENOUS | Status: AC
  Filled 2015-10-17: qty 5

## 2015-10-17 MED ORDER — SODIUM CHLORIDE 0.9 % IV SOLN
Freq: Once | INTRAVENOUS | Status: AC
  Administered 2015-10-17: 11:00:00 via INTRAVENOUS

## 2015-10-17 MED ORDER — FAMOTIDINE IN NACL 20-0.9 MG/50ML-% IV SOLN
20.0000 mg | Freq: Once | INTRAVENOUS | Status: AC
  Administered 2015-10-17: 20 mg via INTRAVENOUS

## 2015-10-17 NOTE — Telephone Encounter (Signed)
Gave patient avs report and appointments for April and May. Per 4/3 pof r/s pet fro 4/11 @ 7 am - patient has appointment.

## 2015-10-17 NOTE — Progress Notes (Signed)
[place] [phone]   [phone]  OFFICE PROGRESS NOTE  No PCP Per Patient No address on file  DIAGNOSIS: Questionable stage IIIA (T2a, N2, M0) non-small cell lung cancer, squamous cell carcinoma diagnosed in February 2017 and presented with left upper lobe obstructing mass as well as questionable periaortic lymphadenopathy.  PRIOR THERAPY: None.  CURRENT THERAPY: Concurrent chemoradiation with weekly carboplatin for AUC of 2 and paclitaxel 45 MG/M2. First treatment was given on 10/10/2015.  INTERVAL HISTORY: Maurice Mcknight 55 y.o. male returns to the clinic today for hospital follow-up visit. The patient is currently undergoing a course of concurrent chemoradiation with weekly carboplatin and paclitaxel. He tolerated the first week of his treatment fairly well was no significant adverse effects. Unfortunately he missed his appointment for the PET scan last week. The patient is feeling well today but continues to have shortness breath with exertion as well as mild cough. He denied having any significant chest pain or hemoptysis. He denied having any significant fever or chills. He has no nausea or vomiting. He is here today to start cycle #2.  MEDICAL HISTORY: Past Medical History  Diagnosis Date  . Malnutrition ([place]) 09/28/2015  . Needs smoking cessation education 09/28/2015    ALLERGIES:  has No Known Allergies.  MEDICATIONS:  Current Outpatient Prescriptions  Medication Sig Dispense Refill  . aspirin-sod bicarb-citric acid (ALKA-SELTZER) 325 MG TBEF tablet Take 325 mg by mouth every 6 (six) hours as needed. For cold symptoms    . benzonatate (TESSALON) 100 MG capsule Take 1 capsule (100 mg total) by mouth 3 (three) times daily as needed for cough. 20 capsule 0  . levofloxacin (LEVAQUIN) 750 MG tablet Take 1 tablet (750 mg total) by mouth daily at 6 PM. 5 tablet 0  . prochlorperazine (COMPAZINE) 10 MG tablet Take 1 tablet (10 mg total) by  mouth every 6 (six) hours as needed for nausea or vomiting. 30 tablet 0  . dextromethorphan (DELSYM) 30 MG/5ML liquid Take 30 mg by mouth at bedtime as needed for cough.     No current facility-administered medications for this visit.    SURGICAL HISTORY:  Past Surgical History  Procedure Laterality Date  . Video bronchoscopy Bilateral 09/09/2015    Diagnosis    REVIEW OF SYSTEMS:  A comprehensive review of systems was negative except for: Constitutional: positive for fatigue   PHYSICAL EXAMINATION: General appearance: alert, cooperative, fatigued and no distress Head: Normocephalic, without obvious abnormality, atraumatic Neck: no adenopathy, no JVD, supple, symmetrical, trachea midline and thyroid not enlarged, symmetric, no tenderness/mass/nodules Lymph nodes: Cervical, supraclavicular, and axillary nodes normal. Resp: diminished breath sounds LUL and wheezes bilaterally Back: symmetric, no curvature. ROM normal. No CVA tenderness. Cardio: regular rate and rhythm, S1, S2 normal, no murmur, click, rub or gallop GI: soft, non-tender; bowel sounds normal; no masses,  no organomegaly Extremities: extremities normal, atraumatic, no cyanosis or edema Neurologic: Alert and oriented X 3, normal strength and tone. Normal symmetric reflexes. Normal coordination and gait  ECOG PERFORMANCE STATUS: 1 - Symptomatic but completely ambulatory  Blood pressure 108/69, pulse 63, temperature 97.8 F (36.6 C), temperature source Oral, resp. rate 18, height '6\' 1"'$  (1.854 m), weight 137 lb 14.4 oz (62.551 kg), SpO2 99 %.  LABORATORY DATA: Lab Results  Component Value Date   WBC 4.9 10/10/2015   HGB 14.3 10/10/2015   HCT 43.6 10/10/2015   MCV 89.4 10/10/2015   PLT 219 10/10/2015  Chemistry      Component Value Date/Time   NA 141 10/10/2015 0948   NA 136 09/19/2015 0803   K 4.0 10/10/2015 0948   K 4.2 09/19/2015 0803   CL 103 09/19/2015 0803   CO2 27 10/10/2015 0948   CO2 25 09/19/2015  0803   BUN 14.1 10/10/2015 0948   BUN 9 09/19/2015 0803   CREATININE 1.0 10/10/2015 0948   CREATININE 0.83 09/19/2015 0803      Component Value Date/Time   CALCIUM 8.9 10/10/2015 0948   CALCIUM 8.4* 09/19/2015 0803   ALKPHOS 124 10/10/2015 0948   ALKPHOS 100 09/09/2015 0403   AST 27 10/10/2015 0948   AST 29 09/09/2015 0403   ALT 47 10/10/2015 0948   ALT 22 09/09/2015 0403   BILITOT <0.30 10/10/2015 0948   BILITOT 0.5 09/09/2015 0403       RADIOGRAPHIC STUDIES: No results found.  ASSESSMENT AND PLAN: This is a very pleasant 55 years old African-American male with questionable as stage IIIa non-small cell lung cancer, squamous cell carcinoma with large obstructing left upper lobe lung mass and questionable mediastinal lymphadenopathy diagnosed in February 2017. The patient is currently undergoing concurrent chemoradiation with weekly carboplatin and paclitaxel is status post 1 cycle. I recommended for him to proceed with cycle #2 today as a scheduled. I will also arrange for the patient to reschedule his PET scan that was metastatic from last week. For smoke cessation, I strongly encouraged the patient to quit smoking and offered him a smoke cessation program. The patient would come back for follow-up visit in 2 weeks for reevaluation and management of any adverse effect of his treatment. He was advised to call immediately if he has any concerning symptoms in the interval. The patient voices understanding of current disease status and treatment options and is in agreement with the current care plan.  All questions were answered. The patient knows to call the clinic with any problems, questions or concerns. We can certainly see the patient much sooner if necessary.  Disclaimer: This note was dictated with voice recognition software. Similar sounding words can inadvertently be transcribed and may not be corrected upon review.

## 2015-10-17 NOTE — Progress Notes (Signed)
CSW provided patient with 31 day bus pass to use while completing radiation treatment.  Polo Riley, MSW, LCSW, OSW-C Clinical Social Worker [facility] [phone]

## 2015-10-17 NOTE — Patient Instructions (Signed)
[place] Discharge Instructions for Patients Receiving Chemotherapy  Today you received the following chemotherapy agents Taxol/carbo  To help prevent nausea and vomiting after your treatment, we encourage you to take your nausea medication as needed.   If you develop nausea and vomiting that is not controlled by your nausea medication, call the clinic.   BELOW ARE SYMPTOMS THAT SHOULD BE REPORTED IMMEDIATELY:  *FEVER GREATER THAN 100.5 F  *CHILLS WITH OR WITHOUT FEVER  NAUSEA AND VOMITING THAT IS NOT CONTROLLED WITH YOUR NAUSEA MEDICATION  *UNUSUAL SHORTNESS OF BREATH  *UNUSUAL BRUISING OR BLEEDING  TENDERNESS IN MOUTH AND THROAT WITH OR WITHOUT PRESENCE OF ULCERS  *URINARY PROBLEMS  *BOWEL PROBLEMS  UNUSUAL RASH Items with * indicate a potential emergency and should be followed up as soon as possible.  Feel free to call the clinic you have any questions or concerns. The clinic phone number is [phone].  Please show the [place] at check-in to the Emergency Department and triage nurse.

## 2015-10-18 ENCOUNTER — Ambulatory Visit
Admission: RE | Admit: 2015-10-18 | Discharge: 2015-10-18 | Disposition: A | Discharge status: Home / Self Care | Payer: BLUE CROSS/BLUE SHIELD | Source: Ambulatory Visit | Attending: Radiation Oncology | Admitting: Radiation Oncology

## 2015-10-18 ENCOUNTER — Encounter: Payer: Self-pay | Admitting: Radiation Oncology

## 2015-10-18 VITALS — BP 109/89 | HR 71 | Temp 98.2°F | Resp 16 | Ht 73.0 in | Wt 136.4 lb

## 2015-10-18 DIAGNOSIS — R05 Cough: Secondary | ICD-10-CM | POA: Diagnosis not present

## 2015-10-18 DIAGNOSIS — Z510 Encounter for antineoplastic radiation therapy: Secondary | ICD-10-CM | POA: Diagnosis not present

## 2015-10-18 DIAGNOSIS — Z923 Personal history of irradiation: Secondary | ICD-10-CM | POA: Diagnosis not present

## 2015-10-18 DIAGNOSIS — C3412 Malignant neoplasm of upper lobe, left bronchus or lung: Secondary | ICD-10-CM

## 2015-10-18 MED ORDER — RADIAPLEXRX EX GEL
Freq: Once | CUTANEOUS | Status: AC
  Administered 2015-10-18: 14:00:00 via TOPICAL

## 2015-10-18 NOTE — Progress Notes (Signed)
  Radiation Oncology         ([phone]) [phone] ________________________________  Name: Maurice Mcknight MRN: [id number]  Date: 10/18/2015  DOB: [birth year]  Weekly Radiation Therapy Management    ICD-9-CM ICD-10-CM   1. Malignant neoplasm of upper lobe of left lung (HCC) 162.3 C34.12      Current Dose: 4 Gy     Planned Dose:  60 Gy  Narrative . . . . . . . . The patient presents for routine under treatment assessment.                                   The patient is without complaint.  Maurice Mcknight has completed 2 fractions to his left chest. He denies having pain, shortness of breath and fatigue. He does report an occasional dry cough. He had his 2nd round of chemotherapy yesterday.                                 Set-up films were reviewed.                                 The chart was checked. Physical Findings. . .  height is '6\' 1"'$  (1.854 m) and weight is 136 lb 6.4 oz (61.871 kg). His oral temperature is 98.2 F (36.8 C). His blood pressure is 109/89 and his pulse is 71. His respiration is 16 and oxygen saturation is 100%. . Weight essentially stable.  No significant changes. The lungs are clear. The heart has a regular rhythm and rate Impression . . . . . . . The patient is tolerating radiation. Plan . . . . . . . . . . . . Continue treatment as planned.  ________________________________   [name], PhD, MD

## 2015-10-18 NOTE — Progress Notes (Signed)
Pt here for patient teaching.  Pt given Radiation and You booklet and Radiaplex gel. Pt reports they have not watched the Radiation Therapy Education video and has been given the link to watch at home.  Reviewed areas of pertinence such as fatigue, skin changes and throat changes . Pt able to give teach back of to pat skin and use unscented/gentle soap,apply Radiaplex bid and avoid applying anything to skin within 4 hours of treatment. Pt demonstrated understanding and verbalizes understanding of information given and will contact nursing with any questions or concerns.     Http://rtanswers.org/treatmentinformation/whattoexpect/index

## 2015-10-18 NOTE — Progress Notes (Signed)
Maurice Mcknight has completed 2 fractions to his left chest.  He denies having pain, shortness of breath and fatigue.  He does report an occasional dry cough.  He had his 2 round of chemotherapy yesterday.  BP 109/89 mmHg  Pulse 71  Temp(Src) 98.2 F (36.8 C) (Oral)  Resp 16  Ht '6\' 1"'$  (1.854 m)  Wt 136 lb 6.4 oz (61.871 kg)  BMI 18.00 kg/m2  SpO2 100%   Wt Readings from Last 3 Encounters:  10/18/15 136 lb 6.4 oz (61.871 kg)  10/17/15 137 lb 14.4 oz (62.551 kg)  10/06/15 140 lb 14.4 oz (63.912 kg)

## 2015-10-19 ENCOUNTER — Ambulatory Visit
Admission: RE | Admit: 2015-10-19 | Discharge: 2015-10-19 | Disposition: A | Discharge status: Home / Self Care | Payer: BLUE CROSS/BLUE SHIELD | Source: Ambulatory Visit | Attending: Radiation Oncology | Admitting: Radiation Oncology

## 2015-10-19 DIAGNOSIS — Z510 Encounter for antineoplastic radiation therapy: Secondary | ICD-10-CM | POA: Diagnosis not present

## 2015-10-20 ENCOUNTER — Ambulatory Visit
Admission: RE | Admit: 2015-10-20 | Discharge: 2015-10-20 | Disposition: A | Discharge status: Home / Self Care | Payer: BLUE CROSS/BLUE SHIELD | Source: Ambulatory Visit | Attending: Radiation Oncology | Admitting: Radiation Oncology

## 2015-10-20 DIAGNOSIS — Z510 Encounter for antineoplastic radiation therapy: Secondary | ICD-10-CM | POA: Diagnosis not present

## 2015-10-21 ENCOUNTER — Ambulatory Visit
Admission: RE | Admit: 2015-10-21 | Discharge: 2015-10-21 | Disposition: A | Discharge status: Home / Self Care | Payer: BLUE CROSS/BLUE SHIELD | Source: Ambulatory Visit | Attending: Radiation Oncology | Admitting: Radiation Oncology

## 2015-10-21 ENCOUNTER — Other Ambulatory Visit: Payer: Self-pay | Admitting: *Deleted

## 2015-10-21 DIAGNOSIS — Z510 Encounter for antineoplastic radiation therapy: Secondary | ICD-10-CM | POA: Diagnosis not present

## 2015-10-21 DIAGNOSIS — C3412 Malignant neoplasm of upper lobe, left bronchus or lung: Secondary | ICD-10-CM

## 2015-10-24 ENCOUNTER — Other Ambulatory Visit: Payer: BLUE CROSS/BLUE SHIELD

## 2015-10-24 ENCOUNTER — Ambulatory Visit: Payer: BLUE CROSS/BLUE SHIELD

## 2015-10-24 ENCOUNTER — Ambulatory Visit: Payer: BLUE CROSS/BLUE SHIELD | Admitting: Internal Medicine

## 2015-10-24 ENCOUNTER — Ambulatory Visit (HOSPITAL_BASED_OUTPATIENT_CLINIC_OR_DEPARTMENT_OTHER): Payer: BLUE CROSS/BLUE SHIELD | Admitting: Nurse Practitioner

## 2015-10-24 ENCOUNTER — Emergency Department (HOSPITAL_COMMUNITY): Payer: BLUE CROSS/BLUE SHIELD

## 2015-10-24 ENCOUNTER — Emergency Department (HOSPITAL_COMMUNITY)
Admission: EM | Admit: 2015-10-24 | Discharge: 2015-10-24 | Disposition: A | Discharge status: Home / Self Care | Payer: BLUE CROSS/BLUE SHIELD | Attending: Emergency Medicine | Admitting: Emergency Medicine

## 2015-10-24 ENCOUNTER — Ambulatory Visit
Admission: RE | Admit: 2015-10-24 | Discharge: 2015-10-24 | Disposition: A | Discharge status: Home / Self Care | Payer: BLUE CROSS/BLUE SHIELD | Source: Ambulatory Visit | Attending: Radiation Oncology | Admitting: Radiation Oncology

## 2015-10-24 ENCOUNTER — Encounter (HOSPITAL_COMMUNITY): Payer: Self-pay | Admitting: Emergency Medicine

## 2015-10-24 ENCOUNTER — Telehealth: Payer: Self-pay | Admitting: Nurse Practitioner

## 2015-10-24 ENCOUNTER — Other Ambulatory Visit: Payer: Self-pay

## 2015-10-24 DIAGNOSIS — R55 Syncope and collapse: Secondary | ICD-10-CM | POA: Diagnosis not present

## 2015-10-24 DIAGNOSIS — C3492 Malignant neoplasm of unspecified part of left bronchus or lung: Secondary | ICD-10-CM | POA: Diagnosis not present

## 2015-10-24 DIAGNOSIS — Z86711 Personal history of pulmonary embolism: Secondary | ICD-10-CM | POA: Insufficient documentation

## 2015-10-24 DIAGNOSIS — R000 Tachycardia, unspecified: Secondary | ICD-10-CM | POA: Diagnosis not present

## 2015-10-24 DIAGNOSIS — C3412 Malignant neoplasm of upper lobe, left bronchus or lung: Secondary | ICD-10-CM | POA: Diagnosis not present

## 2015-10-24 DIAGNOSIS — Z510 Encounter for antineoplastic radiation therapy: Secondary | ICD-10-CM | POA: Diagnosis not present

## 2015-10-24 DIAGNOSIS — F17210 Nicotine dependence, cigarettes, uncomplicated: Secondary | ICD-10-CM | POA: Insufficient documentation

## 2015-10-24 DIAGNOSIS — R0602 Shortness of breath: Secondary | ICD-10-CM | POA: Insufficient documentation

## 2015-10-24 DIAGNOSIS — R42 Dizziness and giddiness: Secondary | ICD-10-CM | POA: Diagnosis not present

## 2015-10-24 DIAGNOSIS — R61 Generalized hyperhidrosis: Secondary | ICD-10-CM | POA: Insufficient documentation

## 2015-10-24 DIAGNOSIS — Z8639 Personal history of other endocrine, nutritional and metabolic disease: Secondary | ICD-10-CM | POA: Diagnosis not present

## 2015-10-24 DIAGNOSIS — R1110 Vomiting, unspecified: Secondary | ICD-10-CM | POA: Diagnosis not present

## 2015-10-24 DIAGNOSIS — R002 Palpitations: Secondary | ICD-10-CM | POA: Insufficient documentation

## 2015-10-24 LAB — BASIC METABOLIC PANEL
ANION GAP: 12 (ref 5–15)
BUN: 16 mg/dL (ref 6–20)
CALCIUM: 9.2 mg/dL (ref 8.9–10.3)
CO2: 22 mmol/L (ref 22–32)
CREATININE: 1.04 mg/dL (ref 0.61–1.24)
Chloride: 100 mmol/L — ABNORMAL LOW (ref 101–111)
Glucose, Bld: 124 mg/dL — ABNORMAL HIGH (ref 65–99)
Potassium: 4 mmol/L (ref 3.5–5.1)
Sodium: 134 mmol/L — ABNORMAL LOW (ref 135–145)

## 2015-10-24 LAB — CBC
HCT: 42.6 % (ref 39.0–52.0)
HEMOGLOBIN: 14.3 g/dL (ref 13.0–17.0)
MCH: 29.3 pg (ref 26.0–34.0)
MCHC: 33.6 g/dL (ref 30.0–36.0)
MCV: 87.3 fL (ref 78.0–100.0)
PLATELETS: 205 10*3/uL (ref 150–400)
RBC: 4.88 MIL/uL (ref 4.22–5.81)
RDW: 13.7 % (ref 11.5–15.5)
WBC: 4.7 10*3/uL (ref 4.0–10.5)

## 2015-10-24 LAB — URINALYSIS, ROUTINE W REFLEX MICROSCOPIC
BILIRUBIN URINE: NEGATIVE
GLUCOSE, UA: NEGATIVE mg/dL
HGB URINE DIPSTICK: NEGATIVE
Ketones, ur: NEGATIVE mg/dL
Leukocytes, UA: NEGATIVE
Nitrite: NEGATIVE
Protein, ur: NEGATIVE mg/dL
pH: 7 (ref 5.0–8.0)

## 2015-10-24 LAB — CBG MONITORING, ED: Glucose-Capillary: 96 mg/dL (ref 65–99)

## 2015-10-24 MED ORDER — IOPAMIDOL (ISOVUE-370) INJECTION 76%
100.0000 mL | Freq: Once | INTRAVENOUS | Status: AC | PRN
  Administered 2015-10-24: 100 mL via INTRAVENOUS

## 2015-10-24 MED ORDER — SODIUM CHLORIDE 0.9 % IV BOLUS (SEPSIS)
1000.0000 mL | Freq: Once | INTRAVENOUS | Status: AC
  Administered 2015-10-24: 1000 mL via INTRAVENOUS

## 2015-10-24 NOTE — Assessment & Plan Note (Signed)
Cycle 2 of his carboplatin/paclitaxel chemotherapy regimen on 10/17/2015.  Patient presented to the [facility] today to receive cycle 3 of the same regimen.  Patient also continues to receive daily radiation treatments.  Patient had a near syncopal event while in the [place] lab area.  See further notes for details.  Patient is scheduled for a PET scan tomorrow, 10/25/2015.  He is scheduled for labs, visit, and chemotherapy a can on 10/31/2015.

## 2015-10-24 NOTE — Discharge Instructions (Signed)

## 2015-10-24 NOTE — ED Notes (Signed)
Bed: RESA Expected date:  Expected time:  Means of arrival:  Comments: 

## 2015-10-24 NOTE — ED Notes (Signed)
Per CC attempting pt labs in chair; near syncope and emesis; a/o x4.

## 2015-10-24 NOTE — Progress Notes (Signed)
Patietn listed as having [employer] without a pcp.  EDCM spoke to patient at bedside.  Patient reports he sees [name] at the cancer center but does not have a pcp.  EDCM provided patient with list of pcps who accept BCBS insurance within a 10 mile radius of patient's zip code.  Patient thankful for services.  No further EDCM needs at this time.

## 2015-10-24 NOTE — Progress Notes (Signed)
SYMPTOM MANAGEMENT CLINIC    Chief Complaint: Near syncope   HPI:  Maurice Mcknight 55 y.o. male diagnosed with lung cancer.  Currently undergoing carboplatin/paclitaxel chemotherapy regimen.   Cycle 2 of his carboplatin/paclitaxel chemotherapy regimen on 10/17/2015.  Patient presented to the [facility] today to receive cycle 3 of the same regimen.  Patient also continues to receive daily radiation treatments.  Patient had a near syncopal event while in the [place] lab area.  Patient appeared very weak; and was diaphoretic.  He was slumping down in the lab chair; but was still able to respond to strong verbal commands.  Patient had one episode of emesis.  Patient was not awake enough to determine if he was experiencing any chest pain or chest pressure.  Patient did not appear short of breath.  O2 was placed at 2 L nasal cannula.  Blood pressure and O2 sat remained stable.  Blood sugar checked was 96 initially.  No further labs were obtained.  Rapid response was called;. and patient was transported to the emergency department for further evaluation and management.  Brief report of today's events was given to [name].  Note: Patient should be considered a full code; since there are no advanced directives noted in the chart.   No history exists.    Review of Systems  Unable to perform ROS Constitutional: Positive for diaphoresis.  Gastrointestinal: Positive for vomiting.  Neurological: Positive for weakness.    Past Medical History  Diagnosis Date  . Malnutrition ([place]) 09/28/2015  . Needs smoking cessation education 09/28/2015  . Encounter for antineoplastic chemotherapy 10/17/2015    Past Surgical History  Procedure Laterality Date  . Video bronchoscopy Bilateral 09/09/2015    Diagnosis    has Malignant neoplasm of upper lobe of left lung ([place]); Pneumonia; Postobstructive pneumonia; Malnutrition ([place]); Needs smoking cessation education; Encounter for  antineoplastic chemotherapy; and Near syncope on his problem list.    has No Known Allergies.    Medication List    Notice    This visit is during an admission. Changes to the med list made in this visit will be reflected in the After Visit Summary of the admission.       PHYSICAL EXAMINATION  Oncology Vitals 10/24/2015 10/24/2015  Height - -  Weight - -  Weight (lbs) - -  BMI (kg/m2) - -  Temp - -  Pulse 96 96  Resp 24 26  SpO2 95 95  BSA (m2) - -   BP Readings from Last 2 Encounters:  10/24/15 105/86  10/18/15 109/89    Physical Exam  Constitutional: He appears malnourished. He appears unhealthy. He appears cachectic. He has a sickly appearance. He appears distressed.  Unable to assess any further secondary to acute distress.  HENT:  Head: Normocephalic and atraumatic.  Pulmonary/Chest: Effort normal. No respiratory distress.  Skin: He is diaphoretic.  Nursing note and vitals reviewed.   LABORATORY DATA:. Admission on 10/24/2015  Component Date Value Ref Range Status  . Sodium 10/24/2015 134* 135 - 145 mmol/L Final  . Potassium 10/24/2015 4.0  3.5 - 5.1 mmol/L Final  . Chloride 10/24/2015 100* 101 - 111 mmol/L Final  . CO2 10/24/2015 22  22 - 32 mmol/L Final  . Glucose, Bld 10/24/2015 124* 65 - 99 mg/dL Final  . BUN 10/24/2015 16  6 - 20 mg/dL Final  . Creatinine, Ser 10/24/2015 1.04  0.61 - 1.24 mg/dL Final  . Calcium 10/24/2015 9.2  8.9 - 10.3  mg/dL Final  . GFR calc non Af Amer 10/24/2015 >60  >60 mL/min Final  . GFR calc Af Amer 10/24/2015 >60  >60 mL/min Final   Comment: (NOTE) The eGFR has been calculated using the CKD EPI equation. This calculation has not been validated in all clinical situations. eGFR's persistently <60 mL/min signify possible Chronic Kidney Disease.   . Anion gap 10/24/2015 12  5 - 15 Final  . WBC 10/24/2015 4.7  4.0 - 10.5 K/uL Final  . RBC 10/24/2015 4.88  4.22 - 5.81 MIL/uL Final  . Hemoglobin 10/24/2015 14.3  13.0 - 17.0  g/dL Final  . HCT 10/24/2015 42.6  39.0 - 52.0 % Final  . MCV 10/24/2015 87.3  78.0 - 100.0 fL Final  . MCH 10/24/2015 29.3  26.0 - 34.0 pg Final  . MCHC 10/24/2015 33.6  30.0 - 36.0 g/dL Final  . RDW 10/24/2015 13.7  11.5 - 15.5 % Final  . Platelets 10/24/2015 205  150 - 400 K/uL Final  . Color, Urine 10/24/2015 AMBER* YELLOW Final   BIOCHEMICALS MAY BE AFFECTED BY COLOR  . APPearance 10/24/2015 CLEAR  CLEAR Final  . Specific Gravity, Urine 10/24/2015 >1.046* 1.005 - 1.030 Final  . pH 10/24/2015 7.0  5.0 - 8.0 Final  . Glucose, UA 10/24/2015 NEGATIVE  NEGATIVE mg/dL Final  . Hgb urine dipstick 10/24/2015 NEGATIVE  NEGATIVE Final  . Bilirubin Urine 10/24/2015 NEGATIVE  NEGATIVE Final  . Ketones, ur 10/24/2015 NEGATIVE  NEGATIVE mg/dL Final  . Protein, ur 10/24/2015 NEGATIVE  NEGATIVE mg/dL Final  . Nitrite 10/24/2015 NEGATIVE  NEGATIVE Final  . Leukocytes, UA 10/24/2015 NEGATIVE  NEGATIVE Final   MICROSCOPIC NOT DONE ON URINES WITH NEGATIVE PROTEIN, BLOOD, LEUKOCYTES, NITRITE, OR GLUCOSE <1000 mg/dL.  Marland Kitchen Glucose-Capillary 10/24/2015 96  65 - 99 mg/dL Final  . Comment 1 10/24/2015 Notify RN   Final    RADIOGRAPHIC STUDIES: Ct Angio Chest Pe W/cm &/or Wo Cm  10/24/2015  CLINICAL DATA:  Near syncope, vomiting, tachycardia, LEFT lung cancer undergoing current chemotherapy and radiation therapy, concern for pulmonary embolism EXAM: CT ANGIOGRAPHY CHEST WITH CONTRAST TECHNIQUE: Multidetector CT imaging of the chest was performed using the standard protocol during bolus administration of intravenous contrast. Multiplanar CT image reconstructions and MIPs were obtained to evaluate the vascular anatomy. CONTRAST:  85 cc Isovue 370 IV COMPARISON:  CT chest 09/08/2015 FINDINGS: RIGHT-side aortic arch extending retro esophageal. Descending thoracic aorta is LEFT of midline. Aorta normal caliber without aneurysm or dissection. Increased abnormal soft tissue versus adenopathy at LEFT hilum, 3.7 x 2.4 cm  image 39. Decrease in size of AP window lymph node 6 mm short axis image 25 previously 14 mm short axis. Pulmonary arteries well opacified and patent. No evidence of pulmonary embolism. Visualized upper abdomen unremarkable. LEFT upper lobe atelectasis with hyper expansion of LEFT lower lobe. Scattered peribronchovascular infiltrates LEFT lower lobe which may represent infection lymphangitic tumor spread is not excluded. Scattered areas of subsegmental atelectasis and peribronchial thickening LEFT lower lobe. No additional infiltrate, pleural effusion, pneumothorax or additional pulmonary mass/ nodule. Partial opacification of bronchitis superior segment of LEFT lower lobe. No acute osseous findings. Review of the MIP images confirms the above findings. IMPRESSION: No evidence of pulmonary embolism. LEFT upper lobe atelectasis with hyper expansion of LEFT lower lobe. Scattered infiltrates and atelectasis in LEFT lower lobe, may represent infection but lymphangitic tumor spread is not excluded with this appearance. RIGHT-side aortic arch coursing retroesophageal. Decreased size of an enlarged AP window  lymph node since the previous exam with additional abnormal soft tissue versus adenopathy at LEFT hilum increased in size. Electronically Signed   By: Lavonia Dana M.D.   On: 10/24/2015 16:34    ASSESSMENT/PLAN:    Malignant neoplasm of upper lobe of left lung (HCC) Cycle 2 of his carboplatin/paclitaxel chemotherapy regimen on 10/17/2015.  Patient presented to the [facility] today to receive cycle 3 of the same regimen.  Patient also continues to receive daily radiation treatments.  Patient had a near syncopal event while in the [place] lab area.  See further notes for details.  Patient is scheduled for a PET scan tomorrow, 10/25/2015.  He is scheduled for labs, visit, and chemotherapy a can on 10/31/2015.  Near syncope Cycle 2 of his carboplatin/paclitaxel chemotherapy regimen on 10/17/2015.   Patient presented to the [facility] today to receive cycle 3 of the same regimen.  Patient also continues to receive daily radiation treatments.  Patient had a near syncopal event while in the [place] lab area.  Patient appeared very weak; and was diaphoretic.  He was slumping down in the lab chair; but was still able to respond to strong verbal commands.  Patient had one episode of emesis.  Patient was not awake enough to determine if he was experiencing any chest pain or chest pressure.  Patient did not appear short of breath.  O2 was placed at 2 L nasal cannula.  Blood pressure and O2 sat remained stable.  Blood sugar checked was 96 initially.  No further labs were obtained.  Rapid response was called;. and patient was transported to the emergency department for further evaluation and management.  Brief report of today's events was given to [name].  Note: Patient should be considered a full code; since there are no advanced directives noted in the chart.    Patient stated understanding of all instructions; and was in agreement with this plan of care. The patient knows to call the clinic with any problems, questions or concerns.   Review/collaboration with Dr. [name] regarding all aspects of patient's visit today.   Total time spent with patient was 25 minutes;  with greater than 75 percent of that time spent in face to face counseling regarding patient's symptoms,  and coordination of care and follow up.  Disclaimer:This dictation was prepared with Dragon/digital dictation along with [employer]. Any transcriptional errors that result from this process are unintentional.  Drue Second, NP 10/24/2015

## 2015-10-24 NOTE — ED Provider Notes (Signed)
CSN: [id number]     Arrival date & time 10/24/15  1200 History   First MD Initiated Contact with Patient 10/24/15 1238     Chief Complaint  Patient presents with  . Near Syncope     (Consider location/radiation/quality/duration/timing/severity/associated sxs/prior Treatment) HPI   55 year old male with near syncopal symptoms with standing. Symptom onset yesterday. Patient reports that standing he will feel palpitations, sensation of lightheadedness, will sometimes get diaphoretic and short of breath. Symptoms improved when sitting back down. Has not had symptoms at rest. Denies any acute pain. No fevers or chills. He has a past history of lung cancer currently undergoing treatment. Past history of pulmonary embolism. No unusual leg pain or swelling.  Past Medical History  Diagnosis Date  . Malnutrition ([place]) 09/28/2015  . Needs smoking cessation education 09/28/2015  . Encounter for antineoplastic chemotherapy 10/17/2015   Past Surgical History  Procedure Laterality Date  . Video bronchoscopy Bilateral 09/09/2015    Diagnosis   Family History  Problem Relation Age of Onset  . Cancer Sister    Social History  Substance Use Topics  . Smoking status: Current Some Day Smoker -- 0.10 packs/day    Types: Cigarettes    Start date: 10/05/1985  . Smokeless tobacco: Never Used  . Alcohol Use: 0.0 oz/week    0 Standard drinks or equivalent per week    Review of Systems  All systems reviewed and negative, other than as noted in HPI.   Allergies  Review of patient's allergies indicates no known allergies.  Home Medications   Prior to Admission medications   Medication Sig Start Date End Date Taking? Authorizing Provider  benzonatate (TESSALON) 100 MG capsule Take 1 capsule (100 mg total) by mouth 3 (three) times daily as needed for cough. Patient not taking: Reported on 10/18/2015 09/19/15   Donne Hazel, MD  prochlorperazine (COMPAZINE) 10 MG tablet Take 1 tablet (10 mg total) by  mouth every 6 (six) hours as needed for nausea or vomiting. Patient not taking: Reported on 10/18/2015 10/05/15   Curt Bears, MD   BP 106/80 mmHg  Pulse 99  Temp(Src) 97.8 F (36.6 C) (Axillary)  Resp 23  Ht '6\' 1"'$  (1.854 m)  Wt 139 lb (63.05 kg)  BMI 18.34 kg/m2  SpO2 97% Physical Exam  Constitutional: He appears well-developed and well-nourished. No distress.  HENT:  Head: Normocephalic and atraumatic.  Eyes: Conjunctivae are normal. Right eye exhibits no discharge. Left eye exhibits no discharge.  Neck: Neck supple.  Cardiovascular: Regular rhythm and normal heart sounds.  Exam reveals no gallop and no friction rub.   No murmur heard. Mild tachycardia  Pulmonary/Chest: Effort normal and breath sounds normal. No respiratory distress.  Abdominal: Soft. He exhibits no distension. There is no tenderness.  Musculoskeletal: He exhibits no edema or tenderness.  Lower extremities symmetric as compared to each other. No calf tenderness. Negative Homan's. No palpable cords.   Neurological: He is alert.  Skin: Skin is warm and dry.  Psychiatric: He has a normal mood and affect. His behavior is normal. Thought content normal.  Nursing note and vitals reviewed.   ED Course  Procedures (including critical care time) Labs Review Labs Reviewed  BASIC METABOLIC PANEL - Abnormal; Notable for the following:    Sodium 134 (*)    Chloride 100 (*)    Glucose, Bld 124 (*)    All other components within normal limits  URINALYSIS, ROUTINE W REFLEX MICROSCOPIC (NOT AT [facility]) - Abnormal; Notable for  the following:    Color, Urine AMBER (*)    Specific Gravity, Urine >1.046 (*)    All other components within normal limits  CBC  CBG MONITORING, ED    Imaging Review Ct Angio Chest Pe W/cm &/or Wo Cm  10/24/2015  CLINICAL DATA:  Near syncope, vomiting, tachycardia, LEFT lung cancer undergoing current chemotherapy and radiation therapy, concern for pulmonary embolism EXAM: CT ANGIOGRAPHY CHEST  WITH CONTRAST TECHNIQUE: Multidetector CT imaging of the chest was performed using the standard protocol during bolus administration of intravenous contrast. Multiplanar CT image reconstructions and MIPs were obtained to evaluate the vascular anatomy. CONTRAST:  85 cc Isovue 370 IV COMPARISON:  CT chest 09/08/2015 FINDINGS: RIGHT-side aortic arch extending retro esophageal. Descending thoracic aorta is LEFT of midline. Aorta normal caliber without aneurysm or dissection. Increased abnormal soft tissue versus adenopathy at LEFT hilum, 3.7 x 2.4 cm image 39. Decrease in size of AP window lymph node 6 mm short axis image 25 previously 14 mm short axis. Pulmonary arteries well opacified and patent. No evidence of pulmonary embolism. Visualized upper abdomen unremarkable. LEFT upper lobe atelectasis with hyper expansion of LEFT lower lobe. Scattered peribronchovascular infiltrates LEFT lower lobe which may represent infection lymphangitic tumor spread is not excluded. Scattered areas of subsegmental atelectasis and peribronchial thickening LEFT lower lobe. No additional infiltrate, pleural effusion, pneumothorax or additional pulmonary mass/ nodule. Partial opacification of bronchitis superior segment of LEFT lower lobe. No acute osseous findings. Review of the MIP images confirms the above findings. IMPRESSION: No evidence of pulmonary embolism. LEFT upper lobe atelectasis with hyper expansion of LEFT lower lobe. Scattered infiltrates and atelectasis in LEFT lower lobe, may represent infection but lymphangitic tumor spread is not excluded with this appearance. RIGHT-side aortic arch coursing retroesophageal. Decreased size of an enlarged AP window lymph node since the previous exam with additional abnormal soft tissue versus adenopathy at LEFT hilum increased in size. Electronically Signed   By: Lavonia Dana M.D.   On: 10/24/2015 16:34   I have personally reviewed and evaluated these images and lab results as part of  my medical decision-making.   EKG Interpretation   Date/Time:  Monday October 24 2015 12:06:36 EDT Ventricular Rate:  88 PR Interval:  137 QRS Duration: 82 QT Interval:  367 QTC Calculation: 444 R Axis:   96 Text Interpretation:  Sinus rhythm Right atrial enlargement right axis  deviation left ventricular hypertrophy Confirmed by Wilson Singer  MD, Tarek Cravens  ([id number]) on 10/24/2015 3:01:54 PM      MDM   Final diagnoses:  Near syncope    54 year old male with orthostatic symptoms. Blood pressure is okay. His resting heart rate in the 90s to 100s. He is afebrile. Labs thus far is unremarkable. EKG without acute change from priors. He denies any chest pain. Abdomen has some concern for possible pulmonary embolism. His resting tachycardia. Is no signs or symptoms of DVT on exam but he does have cancer currently undergoing treatment. Will CT. IVF. Reassess.    -CT negative for PE.  -Pt upset with plan for discharge, mostly stemming from the fact that he doesn't have a ride home now.  -Reporting dizzy when standing, but no apparent difficulty when he took all monitor leads off himself and got out of bed. Seemed steady on his feet.  -Tried to reassure, but remains upset. Discharged in no acute distress.   Virgel Manifold, MD 10/28/15 1126

## 2015-10-24 NOTE — ED Notes (Signed)
Chaplain providing support in [facility] Emergency following code blue at cancer center.    Introduced spiritual care as resource.  Pt alert and reports his nephew dropped him off for chemo.  Pt is supposed to call nephew when treatment is complete.  Wishes to speak with physicians prior to informing nephew that he has been taken to ED.    Chaplain available for assessment and support of spiritual and emotional needs.   Will continue to follow pt at [facility] and report to cancer center support team.      ,[place], [place]

## 2015-10-24 NOTE — Telephone Encounter (Signed)
per pof to add to [employer] mgmt pt seen for emaerency in lab area

## 2015-10-24 NOTE — Assessment & Plan Note (Addendum)
Cycle 2 of his carboplatin/paclitaxel chemotherapy regimen on 10/17/2015.  Patient presented to the [facility] today to receive cycle 3 of the same regimen.  Patient also continues to receive daily radiation treatments.  Patient had a near syncopal event while in the [place] lab area.  Patient appeared very weak; and was diaphoretic.  He was slumping down in the lab chair; but was still able to respond to strong verbal commands.  Patient had one episode of emesis.  Patient was not awake enough to determine if he was experiencing any chest pain or chest pressure.  Patient did not appear short of breath.  O2 was placed at 2 L nasal cannula.  Blood pressure and O2 sat remained stable.  Blood sugar checked was 96 initially.  No further labs were obtained.  Rapid response was called;. and patient was transported to the emergency department for further evaluation and management.  Brief report of today's events was given to [name].  Note: Patient should be considered a full code; since there are no advanced directives noted in the chart.

## 2015-10-25 ENCOUNTER — Ambulatory Visit (HOSPITAL_COMMUNITY)
Admission: RE | Admit: 2015-10-25 | Discharge: 2015-10-25 | Disposition: A | Discharge status: Home / Self Care | Payer: BLUE CROSS/BLUE SHIELD | Source: Ambulatory Visit | Attending: Internal Medicine | Admitting: Internal Medicine

## 2015-10-25 ENCOUNTER — Ambulatory Visit: Payer: BLUE CROSS/BLUE SHIELD | Admitting: Radiation Oncology

## 2015-10-25 ENCOUNTER — Ambulatory Visit
Admission: RE | Admit: 2015-10-25 | Discharge: 2015-10-25 | Disposition: A | Discharge status: Home / Self Care | Payer: BLUE CROSS/BLUE SHIELD | Source: Ambulatory Visit | Attending: Radiation Oncology | Admitting: Radiation Oncology

## 2015-10-25 DIAGNOSIS — J189 Pneumonia, unspecified organism: Secondary | ICD-10-CM | POA: Diagnosis present

## 2015-10-25 DIAGNOSIS — J9819 Other pulmonary collapse: Secondary | ICD-10-CM | POA: Insufficient documentation

## 2015-10-25 DIAGNOSIS — C3412 Malignant neoplasm of upper lobe, left bronchus or lung: Secondary | ICD-10-CM | POA: Diagnosis present

## 2015-10-25 DIAGNOSIS — R918 Other nonspecific abnormal finding of lung field: Secondary | ICD-10-CM | POA: Diagnosis not present

## 2015-10-25 DIAGNOSIS — Z510 Encounter for antineoplastic radiation therapy: Secondary | ICD-10-CM | POA: Diagnosis not present

## 2015-10-25 LAB — GLUCOSE, CAPILLARY: Glucose-Capillary: 89 mg/dL (ref 65–99)

## 2015-10-25 MED ORDER — FLUDEOXYGLUCOSE F - 18 (FDG) INJECTION
6.8000 | Freq: Once | INTRAVENOUS | Status: AC | PRN
  Administered 2015-10-25: 6.8 via INTRAVENOUS

## 2015-10-26 ENCOUNTER — Ambulatory Visit
Admission: RE | Admit: 2015-10-26 | Discharge: 2015-10-26 | Disposition: A | Discharge status: Home / Self Care | Payer: BLUE CROSS/BLUE SHIELD | Source: Ambulatory Visit | Attending: Radiation Oncology | Admitting: Radiation Oncology

## 2015-10-26 ENCOUNTER — Encounter: Payer: Self-pay | Admitting: Radiation Oncology

## 2015-10-26 VITALS — BP 97/72 | HR 100 | Temp 98.4°F | Resp 20 | Ht 73.0 in | Wt 130.3 lb

## 2015-10-26 DIAGNOSIS — C3412 Malignant neoplasm of upper lobe, left bronchus or lung: Secondary | ICD-10-CM

## 2015-10-26 DIAGNOSIS — Z510 Encounter for antineoplastic radiation therapy: Secondary | ICD-10-CM | POA: Diagnosis not present

## 2015-10-26 NOTE — Progress Notes (Signed)
Maurice Mcknight has completed 8 fractions to his left chest.  He denies having pain.  He continues to report shortness of breath.  He also reports having a more frequent dry cough.  He denies trouble swallowing and does not have a sore throat.  He denies having any skin irritation.  He reports that he did not have chemotherapy on Monday because he had a hot flash and felt like he was going to pass out.  He was sent to the ER and was then sent home.  Orthostatic vitals taken: bp sitting 97/72, hr 100, bp standing 95/72, hr 105.  BP 97/72 mmHg  Pulse 100  Temp(Src) 98.4 F (36.9 C) (Oral)  Resp 20  Ht '6\' 1"'$  (1.854 m)  Wt 130 lb 4.8 oz (59.104 kg)  BMI 17.19 kg/m2  SpO2 98%   Wt Readings from Last 3 Encounters:  10/26/15 130 lb 4.8 oz (59.104 kg)  10/24/15 139 lb (63.05 kg)  10/18/15 136 lb 6.4 oz (61.871 kg)

## 2015-10-26 NOTE — Progress Notes (Signed)
  Radiation Oncology         (336) [phone] ________________________________  Name: Maurice Mcknight MRN: [id number]  Date: 10/26/2015  DOB: 1961/01/26    Weekly Radiation Therapy Management    ICD-9-CM ICD-10-CM   1. Malignant neoplasm of upper lobe of left lung (HCC) 162.3 C34.12      Current Dose: 16 Gy     Planned Dose:  60 Gy  Narrative . . . . . . . . The patient presents for routine under treatment assessment. Maurice Mcknight has completed 8 fractions to his left chest. He denies having pain. He continues to report shortness of breath. He also reports having a more frequent dry cough. He denies trouble swallowing and does not have a sore throat. Admits he has not been drinking enough water. He denies having any skin irritation. He reports that he did not have chemotherapy on Monday because he had a hot flash and felt like he was going to pass out. He was sent to the ER and was then sent home. Orthostatic vitals taken: bp sitting 97/72, hr 100, bp standing 95/72, hr 105.                                   The patient is without complaint.                                   Set-up films were reviewed.                                 The chart was checked. Physical Findings. . .  height is '6\' 1"'$  (1.854 m) and weight is 130 lb 4.8 oz (59.104 kg). His oral temperature is 98.4 F (36.9 C). His blood pressure is 97/72 and his pulse is 100. His respiration is 20 and oxygen saturation is 98%. . Weight essentially stable.  No significant changes. Decreased breath sounds throughout the left lung. The heart has a regular rhythm and slightly increased rate. No evidence of thrush in the oral cavity.  Impression . . . . . . . The patient is tolerating radiation. Plan . . . . . . . . . . . . Continue treatment as planned. Encouraged the patient to stay hydrated.   ________________________________   [name], PhD, MD  This document serves as a record of services personally performed by Gery Pray, MD. It was created on his behalf by Arlyce Harman, a trained medical scribe. The creation of this record is based on the scribe's personal observations and the provider's statements to them. This document has been checked and approved by the attending provider.

## 2015-10-27 ENCOUNTER — Ambulatory Visit
Admission: RE | Admit: 2015-10-27 | Discharge: 2015-10-27 | Disposition: A | Discharge status: Home / Self Care | Payer: BLUE CROSS/BLUE SHIELD | Source: Ambulatory Visit | Attending: Radiation Oncology | Admitting: Radiation Oncology

## 2015-10-27 ENCOUNTER — Encounter: Payer: Self-pay | Admitting: Internal Medicine

## 2015-10-27 DIAGNOSIS — Z510 Encounter for antineoplastic radiation therapy: Secondary | ICD-10-CM | POA: Diagnosis not present

## 2015-10-27 NOTE — Progress Notes (Signed)
Completed Medicaid application for pt.  Mailed it along w/ the needed documents needed for processing today.  Advised pt to look out for information from DSS in the mail.  He verbalized understanding.

## 2015-10-28 ENCOUNTER — Other Ambulatory Visit: Payer: Self-pay | Admitting: Medical Oncology

## 2015-10-28 ENCOUNTER — Ambulatory Visit
Admission: RE | Admit: 2015-10-28 | Discharge: 2015-10-28 | Disposition: A | Discharge status: Home / Self Care | Payer: BLUE CROSS/BLUE SHIELD | Source: Ambulatory Visit | Attending: Radiation Oncology | Admitting: Radiation Oncology

## 2015-10-28 DIAGNOSIS — Z510 Encounter for antineoplastic radiation therapy: Secondary | ICD-10-CM | POA: Diagnosis not present

## 2015-10-28 DIAGNOSIS — C3412 Malignant neoplasm of upper lobe, left bronchus or lung: Secondary | ICD-10-CM

## 2015-10-31 ENCOUNTER — Ambulatory Visit
Admission: RE | Admit: 2015-10-31 | Discharge: 2015-10-31 | Disposition: A | Discharge status: Home / Self Care | Payer: BLUE CROSS/BLUE SHIELD | Source: Ambulatory Visit | Attending: Radiation Oncology | Admitting: Radiation Oncology

## 2015-10-31 ENCOUNTER — Ambulatory Visit: Payer: BLUE CROSS/BLUE SHIELD | Admitting: Nutrition

## 2015-10-31 ENCOUNTER — Ambulatory Visit (HOSPITAL_BASED_OUTPATIENT_CLINIC_OR_DEPARTMENT_OTHER): Payer: BLUE CROSS/BLUE SHIELD

## 2015-10-31 ENCOUNTER — Encounter: Payer: Self-pay | Admitting: Internal Medicine

## 2015-10-31 ENCOUNTER — Telehealth: Payer: Self-pay | Admitting: Internal Medicine

## 2015-10-31 ENCOUNTER — Encounter: Payer: Self-pay | Admitting: *Deleted

## 2015-10-31 ENCOUNTER — Other Ambulatory Visit (HOSPITAL_BASED_OUTPATIENT_CLINIC_OR_DEPARTMENT_OTHER): Payer: BLUE CROSS/BLUE SHIELD

## 2015-10-31 ENCOUNTER — Ambulatory Visit (HOSPITAL_BASED_OUTPATIENT_CLINIC_OR_DEPARTMENT_OTHER): Payer: BLUE CROSS/BLUE SHIELD | Admitting: Internal Medicine

## 2015-10-31 VITALS — BP 90/67 | HR 81 | Temp 98.0°F | Resp 18 | Ht 73.0 in | Wt 130.4 lb

## 2015-10-31 DIAGNOSIS — Z5111 Encounter for antineoplastic chemotherapy: Secondary | ICD-10-CM

## 2015-10-31 DIAGNOSIS — C3412 Malignant neoplasm of upper lobe, left bronchus or lung: Secondary | ICD-10-CM

## 2015-10-31 DIAGNOSIS — R0609 Other forms of dyspnea: Secondary | ICD-10-CM | POA: Diagnosis not present

## 2015-10-31 DIAGNOSIS — R05 Cough: Secondary | ICD-10-CM

## 2015-10-31 DIAGNOSIS — Z720 Tobacco use: Secondary | ICD-10-CM

## 2015-10-31 DIAGNOSIS — Z510 Encounter for antineoplastic radiation therapy: Secondary | ICD-10-CM | POA: Diagnosis not present

## 2015-10-31 DIAGNOSIS — E46 Unspecified protein-calorie malnutrition: Secondary | ICD-10-CM

## 2015-10-31 LAB — COMPREHENSIVE METABOLIC PANEL
ALBUMIN: 2.8 g/dL — AB (ref 3.5–5.0)
ALK PHOS: 82 U/L (ref 40–150)
ALT: 21 U/L (ref 0–55)
AST: 22 U/L (ref 5–34)
Anion Gap: 10 mEq/L (ref 3–11)
BUN: 9.3 mg/dL (ref 7.0–26.0)
CALCIUM: 9.2 mg/dL (ref 8.4–10.4)
CO2: 24 mEq/L (ref 22–29)
CREATININE: 0.9 mg/dL (ref 0.7–1.3)
Chloride: 99 mEq/L (ref 98–109)
EGFR: >90 mL/min/{1.73_m2} (ref >90)
Glucose: 94 mg/dl (ref 70–140)
Potassium: 3.9 mEq/L (ref 3.5–5.1)
Sodium: 133 mEq/L — ABNORMAL LOW (ref 136–145)
Total Bilirubin: 0.49 mg/dL (ref 0.20–1.20)
Total Protein: 8.1 g/dL (ref 6.4–8.3)

## 2015-10-31 LAB — CBC WITH DIFFERENTIAL/PLATELET
BASO%: 1.1 % (ref 0.0–2.0)
BASOS ABS: 0 10*3/uL (ref 0.0–0.1)
EOS%: 0.8 % (ref 0.0–7.0)
Eosinophils Absolute: 0 10*3/uL (ref 0.0–0.5)
HEMATOCRIT: 35.7 % — AB (ref 38.4–49.9)
HEMOGLOBIN: 12.4 g/dL — AB (ref 13.0–17.1)
LYMPH#: 0.8 10*3/uL — AB (ref 0.9–3.3)
LYMPH%: 22.8 % (ref 14.0–49.0)
MCH: 30.3 pg (ref 27.2–33.4)
MCHC: 34.7 g/dL (ref 32.0–36.0)
MCV: 87.3 fL (ref 79.3–98.0)
MONO#: 0.5 10*3/uL (ref 0.1–0.9)
MONO%: 13.6 % (ref 0.0–14.0)
NEUT#: 2.2 10*3/uL (ref 1.5–6.5)
NEUT%: 61.7 % (ref 39.0–75.0)
Platelets: 256 10*3/uL (ref 140–400)
RBC: 4.09 10*6/uL — ABNORMAL LOW (ref 4.20–5.82)
RDW: 13.6 % (ref 11.0–14.6)
WBC: 3.6 10*3/uL — ABNORMAL LOW (ref 4.0–10.3)

## 2015-10-31 MED ORDER — PALONOSETRON HCL INJECTION 0.25 MG/5ML
0.2500 mg | Freq: Once | INTRAVENOUS | Status: AC
  Administered 2015-10-31: 0.25 mg via INTRAVENOUS

## 2015-10-31 MED ORDER — FAMOTIDINE IN NACL 20-0.9 MG/50ML-% IV SOLN
INTRAVENOUS | Status: AC
  Filled 2015-10-31: qty 50

## 2015-10-31 MED ORDER — FAMOTIDINE IN NACL 20-0.9 MG/50ML-% IV SOLN
20.0000 mg | Freq: Once | INTRAVENOUS | Status: AC
  Administered 2015-10-31: 20 mg via INTRAVENOUS

## 2015-10-31 MED ORDER — PALONOSETRON HCL INJECTION 0.25 MG/5ML
INTRAVENOUS | Status: AC
Start: 2015-10-31 — End: 2015-10-31
  Filled 2015-10-31: qty 5

## 2015-10-31 MED ORDER — SODIUM CHLORIDE 0.9 % IV SOLN
20.0000 mg | Freq: Once | INTRAVENOUS | Status: AC
  Administered 2015-10-31: 20 mg via INTRAVENOUS
  Filled 2015-10-31: qty 2

## 2015-10-31 MED ORDER — DIPHENHYDRAMINE HCL 25 MG PO CAPS
ORAL_CAPSULE | ORAL | Status: AC
  Filled 2015-10-31: qty 2

## 2015-10-31 MED ORDER — SODIUM CHLORIDE 0.9 % IV SOLN
Freq: Once | INTRAVENOUS | Status: AC
  Administered 2015-10-31: 11:00:00 via INTRAVENOUS

## 2015-10-31 MED ORDER — CARBOPLATIN CHEMO INJECTION 450 MG/45ML
215.4000 mg | Freq: Once | INTRAVENOUS | Status: AC
  Administered 2015-10-31: 220 mg via INTRAVENOUS
  Filled 2015-10-31: qty 22

## 2015-10-31 MED ORDER — SODIUM CHLORIDE 0.9 % IV SOLN
45.0000 mg/m2 | Freq: Once | INTRAVENOUS | Status: AC
  Administered 2015-10-31: 84 mg via INTRAVENOUS
  Filled 2015-10-31: qty 14

## 2015-10-31 MED ORDER — DIPHENHYDRAMINE HCL 50 MG/ML IJ SOLN
50.0000 mg | Freq: Once | INTRAMUSCULAR | Status: AC
  Administered 2015-10-31: 50 mg via INTRAVENOUS

## 2015-10-31 NOTE — Progress Notes (Signed)
Oncology Nurse Navigator Documentation  Oncology Nurse Navigator Flowsheets 10/31/2015  Navigator Location [facility]  Patient Visit Type MedOnc  Treatment Phase Treatment  Barriers/Navigation Needs Education  Education Other  Interventions Other  Acuity Level 2  Acuity Level 1 Minimal follow up required  Time Spent with Patient 45   1. Educational Needs: Yes Understanding Disease: [employer] information Resources: [employer] squamous cell lung cancer information  2. Financial Needs: Yes Referrals: Social work involved  Resources: PAF Co-pay relief program [phone] [employer] prescription [phone]  3. Nutritional Needs: Yes Referral Nutritionist Nutritionist involved Resources:[employer] pantries informaiton  4. Physical Activity Needs: None observed    5. Smoking Cessation Needs: Yes Resources: Patient stated he smoked last weekend but will not smoke anymore  6. Support System Needs: Yes Resources: Support Group living with cancer  7. Transportation Needs: Already addressed Resources: Already had set up transportation

## 2015-10-31 NOTE — Patient Instructions (Signed)
[facility] Discharge Instructions for Patients Receiving Chemotherapy  Today you received the following chemotherapy agents: Taxol and Carboplatin.  To help prevent nausea and vomiting after your treatment, we encourage you to take your nausea medication, Compazine, every 6 hours as needed.   If you develop nausea and vomiting that is not controlled by your nausea medication, call the clinic.   BELOW ARE SYMPTOMS THAT SHOULD BE REPORTED IMMEDIATELY:  *FEVER GREATER THAN 100.5 F  *CHILLS WITH OR WITHOUT FEVER  NAUSEA AND VOMITING THAT IS NOT CONTROLLED WITH YOUR NAUSEA MEDICATION  *UNUSUAL SHORTNESS OF BREATH  *UNUSUAL BRUISING OR BLEEDING  TENDERNESS IN MOUTH AND THROAT WITH OR WITHOUT PRESENCE OF ULCERS  *URINARY PROBLEMS  *BOWEL PROBLEMS  UNUSUAL RASH Items with * indicate a potential emergency and should be followed up as soon as possible.  Feel free to call the clinic you have any questions or concerns. The clinic phone number is [phone].  Please show the [facility] at check-in to the Emergency Department and triage nurse.

## 2015-10-31 NOTE — Progress Notes (Signed)
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  PRIOR THERAPY: None.  CURRENT THERAPY: Concurrent chemoradiation with weekly carboplatin for AUC of 2 and paclitaxel 45 MG/M2. First treatment was given on 10/10/2015. Status post 2 cycles.  INTERVAL HISTORY: Maurice Mcknight 55 y.o. male returns to the clinic today for follow-up visit. The patient is currently undergoing a course of concurrent chemoradiation with weekly carboplatin and paclitaxel. He tolerated the first 2 weeks week of his treatment fairly well with no significant adverse effects. He missed his treatment last week secondary to tachycardia and hypotension. The patient is feeling well today but continues to have shortness breath with exertion as well as mild cough. He denied having any significant chest pain or hemoptysis. He denied having any significant fever or chills. He has no nausea or vomiting.  He had a PET scan performed recently . He is here today to start cycle #3.  MEDICAL HISTORY: Past Medical History  Diagnosis Date  . Malnutrition ([place]) 09/28/2015  . Needs smoking cessation education 09/28/2015  . Encounter for antineoplastic chemotherapy 10/17/2015    ALLERGIES:  has No Known Allergies.  MEDICATIONS:  Current Outpatient Prescriptions  Medication Sig Dispense Refill  . benzonatate (TESSALON) 100 MG capsule Take 1 capsule (100 mg total) by mouth 3 (three) times daily as needed for cough. (Patient not taking: Reported on 10/18/2015) 20 capsule 0  . prochlorperazine (COMPAZINE) 10 MG tablet Take 1 tablet (10 mg total) by mouth every 6 (six) hours as needed for nausea or vomiting. (Patient not  taking: Reported on 10/18/2015) 30 tablet 0   No current facility-administered medications for this visit.    SURGICAL HISTORY:  Past Surgical History  Procedure Laterality Date  . Video bronchoscopy Bilateral 09/09/2015    Diagnosis    REVIEW OF SYSTEMS:  A comprehensive review of systems was negative except for: Constitutional: positive for fatigue and weight loss   PHYSICAL EXAMINATION: General appearance: alert, cooperative, fatigued and no distress Head: Normocephalic, without obvious abnormality, atraumatic Neck: no adenopathy, no JVD, supple, symmetrical, trachea midline and thyroid not enlarged, symmetric, no tenderness/mass/nodules Lymph nodes: Cervical, supraclavicular, and axillary nodes normal. Resp: diminished breath sounds LUL and wheezes bilaterally Back: symmetric, no curvature. ROM normal. No CVA tenderness. Cardio: regular rate and rhythm, S1, S2 normal, no murmur, click, rub or gallop GI: soft, non-tender; bowel sounds normal; no masses,  no organomegaly Extremities: extremities normal, atraumatic, no cyanosis or edema Neurologic: Alert and oriented X 3, normal strength and tone. Normal symmetric reflexes. Normal coordination and gait  ECOG PERFORMANCE STATUS: 1 - Symptomatic but completely ambulatory  Blood pressure 90/67, pulse 81, temperature 98 F (36.7 C), temperature source Oral, resp. rate 18, height '6\' 1"'$  (1.854 m), weight 130 lb 6.4 oz (59.149 kg), SpO2 98 %.  LABORATORY DATA: Lab Results  Component Value Date   WBC 3.6* 10/31/2015   HGB 12.4* 10/31/2015   HCT 35.7* 10/31/2015   MCV 87.3 10/31/2015   PLT 256 10/31/2015      Chemistry      Component Value Date/Time   NA 133* 10/31/2015 0903   NA 134* 10/24/2015 1206   K 3.9 10/31/2015 0903   K 4.0  10/24/2015 1206   CL 100* 10/24/2015 1206   CO2 24 10/31/2015 0903   CO2 22 10/24/2015 1206   BUN 9.3 10/31/2015 0903   BUN 16 10/24/2015 1206   CREATININE 0.9 10/31/2015 0903   CREATININE 1.04  10/24/2015 1206      Component Value Date/Time   CALCIUM 9.2 10/31/2015 0903   CALCIUM 9.2 10/24/2015 1206   ALKPHOS 82 10/31/2015 0903   ALKPHOS 100 09/09/2015 0403   AST 22 10/31/2015 0903   AST 29 09/09/2015 0403   ALT 21 10/31/2015 0903   ALT 22 09/09/2015 0403   BILITOT 0.49 10/31/2015 0903   BILITOT 0.5 09/09/2015 0403       RADIOGRAPHIC STUDIES: Ct Angio Chest Pe W/cm &/or Wo Cm  10/24/2015  CLINICAL DATA:  Near syncope, vomiting, tachycardia, LEFT lung cancer undergoing current chemotherapy and radiation therapy, concern for pulmonary embolism EXAM: CT ANGIOGRAPHY CHEST WITH CONTRAST TECHNIQUE: Multidetector CT imaging of the chest was performed using the standard protocol during bolus administration of intravenous contrast. Multiplanar CT image reconstructions and MIPs were obtained to evaluate the vascular anatomy. CONTRAST:  85 cc Isovue 370 IV COMPARISON:  CT chest 09/08/2015 FINDINGS: RIGHT-side aortic arch extending retro esophageal. Descending thoracic aorta is LEFT of midline. Aorta normal caliber without aneurysm or dissection. Increased abnormal soft tissue versus adenopathy at LEFT hilum, 3.7 x 2.4 cm image 39. Decrease in size of AP window lymph node 6 mm short axis image 25 previously 14 mm short axis. Pulmonary arteries well opacified and patent. No evidence of pulmonary embolism. Visualized upper abdomen unremarkable. LEFT upper lobe atelectasis with hyper expansion of LEFT lower lobe. Scattered peribronchovascular infiltrates LEFT lower lobe which may represent infection lymphangitic tumor spread is not excluded. Scattered areas of subsegmental atelectasis and peribronchial thickening LEFT lower lobe. No additional infiltrate, pleural effusion, pneumothorax or additional pulmonary mass/ nodule. Partial opacification of bronchitis superior segment of LEFT lower lobe. No acute osseous findings. Review of the MIP images confirms the above findings. IMPRESSION: No evidence of  pulmonary embolism. LEFT upper lobe atelectasis with hyper expansion of LEFT lower lobe. Scattered infiltrates and atelectasis in LEFT lower lobe, may represent infection but lymphangitic tumor spread is not excluded with this appearance. RIGHT-side aortic arch coursing retroesophageal. Decreased size of an enlarged AP window lymph node since the previous exam with additional abnormal soft tissue versus adenopathy at LEFT hilum increased in size. Electronically Signed   By: Lavonia Dana M.D.   On: 10/24/2015 16:34   Nm Pet Image Initial (pi) Skull Base To Thigh  10/25/2015  CLINICAL DATA:  Subsequent treatment strategy for lung cancer. EXAM: NUCLEAR MEDICINE PET SKULL BASE TO THIGH TECHNIQUE: 6.8 mCi F-18 FDG was injected intravenously. Full-ring PET imaging was performed from the skull base to thigh after the radiotracer. CT data was obtained and used for attenuation correction and anatomic localization. FASTING BLOOD GLUCOSE:  Value: 89 Mg/dl COMPARISON:  Chest CT 10/24/2015.  Chest CT 09/08/2015. FINDINGS: NECK No hypermetabolic lymph nodes in the neck. CHEST The dominant hypermetabolic lesion on today's study is in the left hilum with SUV max = 18.2. Although the hilar regions are difficult to assess given the lack of intravenous contrast material, this roughly corresponds to a 3.0 x 2.9 cm left hilar lesion, better measured on the previous study with contrast material at 3.7 x 2.4 cm. This lesion includes a left mainstem bronchus. There is associated left upper lobe collapse. Hypermetabolic focus in the region of the left thoracic  inlet has SUV max = 5.2 although no underlying lesion cannot be identified on the CT images today. Patchy airspace disease is seen in the left lower lobe with confluent consolidation posteriorly (image 55 series 6). This confluent posterior airspace disease in the lower lobe is hypermetabolic with SUV max = 7. Uptake in the distal esophagus is considered physiologic. ABDOMEN/PELVIS  Potential low level uptake is identified in the left adrenal gland with SUV max = 3.5. No discrete adrenal nodule by CT imaging. Foci of uptake along the pelvic sidewall bilaterally without underlying CT correlate felt to represent activity within the ureters. SKELETON No focal hypermetabolic activity to suggest skeletal metastasis. IMPRESSION: Markedly hypermetabolic left hilar mass with very obstruction and left upper lobe collapse. This is assisted with hyper metabolism in the region of the left thoracic inlet hands associated with patchy opacity in the posterior left lower lobe. The lower lobe disease may be related to infection/inflammation, but the uptake is relatively high level and tumor spread would be a consideration. Apparent low level uptake in the left adrenal gland without an underlying nodule evident by CT imaging. Close attention on follow-up recommended. Electronically Signed   By: Misty Stanley M.D.   On: 10/25/2015 14:42    ASSESSMENT AND PLAN: This is a very pleasant 55 years old African-American male with questionable as stage IIIa non-small cell lung cancer, squamous cell carcinoma with large obstructing left upper lobe lung mass and questionable mediastinal lymphadenopathy diagnosed in February 2017. The patient is currently undergoing concurrent chemoradiation with weekly carboplatin and paclitaxel is status post 2 cycles. I recommended for him to proceed with cycle #3 today as a scheduled. His recent PET scan showed no evidence of metastatic disease outside the chest. For smoke cessation, I strongly encouraged the patient to quit smoking and offered him a smoke cessation program. The patient would come back for follow-up visit in 2 weeks for reevaluation and management of any adverse effect of his treatment. He was advised to call immediately if he has any concerning symptoms in the interval. The patient voices understanding of current disease status and treatment options and is in  agreement with the current care plan.  All questions were answered. The patient knows to call the clinic with any problems, questions or concerns. We can certainly see the patient much sooner if necessary.  Disclaimer: This note was dictated with voice recognition software. Similar sounding words can inadvertently be transcribed and may not be corrected upon review.

## 2015-10-31 NOTE — Progress Notes (Signed)
Nutrition follow-up completed with patient receiving chemoradiation therapy for lung cancer. Weight decreased and was documented as 130.4 pounds in April 17, down from 140.9 pounds March 27. Patient reports he just does not want to eat.  States food tastes different to him but will not describe specifics Patient refuses all oral nutrition supplements and states he was sick on them.  He does not want to drink any of them. Recognizes 10 pound weight loss and states he is going to try to eat more.  Nutrition diagnosis: Unintended weight loss continues.  Intervention: Provided support and encouragement for patient to increase oral intake. Encouraged patient to contact me if he has questions or concerns. Teach back method was used.  Monitoring, evaluation, goals: Patient will work to increase oral intake to minimize further weight loss.  **Disclaimer: This note was dictated with voice recognition software. Similar sounding words can inadvertently be transcribed and this note may contain transcription errors which may not have been corrected upon publication of note.**

## 2015-10-31 NOTE — Telephone Encounter (Signed)
Gave and printed appt sched and avs for pt for April and may...NO Md/pa availability on 5.15

## 2015-11-01 ENCOUNTER — Encounter: Payer: Self-pay | Admitting: Radiation Oncology

## 2015-11-01 ENCOUNTER — Ambulatory Visit
Admission: RE | Admit: 2015-11-01 | Discharge: 2015-11-01 | Disposition: A | Discharge status: Home / Self Care | Payer: BLUE CROSS/BLUE SHIELD | Source: Ambulatory Visit | Attending: Radiation Oncology | Admitting: Radiation Oncology

## 2015-11-01 VITALS — BP 101/72 | HR 60 | Temp 97.6°F | Resp 16 | Ht 73.0 in | Wt 131.5 lb

## 2015-11-01 DIAGNOSIS — Z510 Encounter for antineoplastic radiation therapy: Secondary | ICD-10-CM | POA: Diagnosis not present

## 2015-11-01 DIAGNOSIS — C3412 Malignant neoplasm of upper lobe, left bronchus or lung: Secondary | ICD-10-CM

## 2015-11-01 NOTE — Progress Notes (Signed)
Maurice Mcknight has completed 12 fractions to his left chest.  He denies having pain, sore throat or trouble swallowing.  He reports having shortness of breath with activity but says it is improving.  He continues to report having a cough with yellow sputum.  He denies having hemoptysis.  He reports having fatigue.  He had chemotherapy yesterday.  He denies having skin irritation on his chest or back.  He reports having a poor appetite and has talked to the dietician.  BP 101/72 mmHg  Pulse 60  Temp(Src) 97.6 F (36.4 C) (Oral)  Resp 16  Ht '6\' 1"'$  (1.854 m)  Wt 131 lb 8 oz (59.648 kg)  BMI 17.35 kg/m2  SpO2 100%   Wt Readings from Last 3 Encounters:  11/01/15 131 lb 8 oz (59.648 kg)  10/31/15 130 lb 6.4 oz (59.149 kg)  10/26/15 130 lb 4.8 oz (59.104 kg)

## 2015-11-01 NOTE — Progress Notes (Signed)
  Radiation Oncology         (336) [phone] ________________________________  Name: Maurice Mcknight MRN: [id number]  Date: 11/01/2015  DOB: [birth year]    Weekly Radiation Therapy Management    ICD-9-CM ICD-10-CM   1. Malignant neoplasm of upper lobe of left lung (HCC) 162.3 C34.12      Current Dose: 24 Gy     Planned Dose:  60 Gy  Narrative . . . . . . . . The patient presents for routine under treatment assessment.     Maurice Mcknight has completed 12 fractions to his left chest. He denies having pain, sore throat, or trouble swallowing. He reports having shortness of breath with activity but says it is improving. He continues to report having a productive cough with yellow sputum. He denies hemoptysis. He reports fatigue. He had chemotherapy yesterday. He denies having skin irritation on his chest or back. He reports having a poor appetite and has talked to the dietician.                                 Set-up films were reviewed.                                 The chart was checked. Physical Findings. . .  height is '6\' 1"'$  (1.854 m) and weight is 131 lb 8 oz (59.648 kg). His oral temperature is 97.6 F (36.4 C). His blood pressure is 101/72 and his pulse is 60. His respiration is 16 and oxygen saturation is 100%. . Weight essentially stable.  No significant changes. Some wheezing noted within the left lung. The heart has a regular rhythm and rate. Impression . . . . . . . The patient is tolerating radiation. Plan . . . . . . . . . . . . Continue treatment as planned. Encouraged the patient to stay hydrated.   ________________________________    [name], PhD, MD  This document serves as a record of services personally performed by Gery Pray, MD. It was created on his behalf by Darcus Austin, a trained medical scribe. The creation of this record is based on the scribe's personal observations and the provider's statements to them. This document has been checked and approved by the  attending provider.

## 2015-11-02 ENCOUNTER — Ambulatory Visit
Admission: RE | Admit: 2015-11-02 | Discharge: 2015-11-02 | Disposition: A | Discharge status: Home / Self Care | Payer: BLUE CROSS/BLUE SHIELD | Source: Ambulatory Visit | Attending: Radiation Oncology | Admitting: Radiation Oncology

## 2015-11-02 DIAGNOSIS — Z510 Encounter for antineoplastic radiation therapy: Secondary | ICD-10-CM | POA: Diagnosis not present

## 2015-11-03 ENCOUNTER — Ambulatory Visit
Admission: RE | Admit: 2015-11-03 | Discharge: 2015-11-03 | Disposition: A | Discharge status: Home / Self Care | Payer: BLUE CROSS/BLUE SHIELD | Source: Ambulatory Visit | Attending: Radiation Oncology | Admitting: Radiation Oncology

## 2015-11-03 ENCOUNTER — Encounter: Payer: Self-pay | Admitting: *Deleted

## 2015-11-03 DIAGNOSIS — Z510 Encounter for antineoplastic radiation therapy: Secondary | ICD-10-CM | POA: Diagnosis not present

## 2015-11-03 NOTE — Progress Notes (Signed)
Clinical Social Work made referral to [employer] to assist with applying for social security disability.  CSW also completed [place] [facility] [employer] application and sent on patient's behalf.  Polo Riley, MSW, LCSW, OSW-C Clinical Social Worker [facility] [phone]

## 2015-11-04 ENCOUNTER — Ambulatory Visit
Admission: RE | Admit: 2015-11-04 | Discharge: 2015-11-04 | Disposition: A | Discharge status: Home / Self Care | Payer: BLUE CROSS/BLUE SHIELD | Source: Ambulatory Visit | Attending: Radiation Oncology | Admitting: Radiation Oncology

## 2015-11-04 ENCOUNTER — Other Ambulatory Visit: Payer: Self-pay | Admitting: *Deleted

## 2015-11-04 DIAGNOSIS — C3412 Malignant neoplasm of upper lobe, left bronchus or lung: Secondary | ICD-10-CM

## 2015-11-04 DIAGNOSIS — Z510 Encounter for antineoplastic radiation therapy: Secondary | ICD-10-CM | POA: Diagnosis not present

## 2015-11-07 ENCOUNTER — Ambulatory Visit (HOSPITAL_BASED_OUTPATIENT_CLINIC_OR_DEPARTMENT_OTHER): Payer: BLUE CROSS/BLUE SHIELD

## 2015-11-07 ENCOUNTER — Ambulatory Visit: Payer: BLUE CROSS/BLUE SHIELD | Admitting: Internal Medicine

## 2015-11-07 ENCOUNTER — Other Ambulatory Visit (HOSPITAL_BASED_OUTPATIENT_CLINIC_OR_DEPARTMENT_OTHER): Payer: BLUE CROSS/BLUE SHIELD

## 2015-11-07 ENCOUNTER — Ambulatory Visit
Admission: RE | Admit: 2015-11-07 | Discharge: 2015-11-07 | Disposition: A | Discharge status: Home / Self Care | Payer: BLUE CROSS/BLUE SHIELD | Source: Ambulatory Visit | Attending: Radiation Oncology | Admitting: Radiation Oncology

## 2015-11-07 ENCOUNTER — Encounter: Payer: BLUE CROSS/BLUE SHIELD | Admitting: Nutrition

## 2015-11-07 VITALS — BP 117/85 | HR 52 | Temp 97.5°F | Resp 15

## 2015-11-07 DIAGNOSIS — Z5111 Encounter for antineoplastic chemotherapy: Secondary | ICD-10-CM

## 2015-11-07 DIAGNOSIS — C3412 Malignant neoplasm of upper lobe, left bronchus or lung: Secondary | ICD-10-CM | POA: Diagnosis not present

## 2015-11-07 DIAGNOSIS — Z510 Encounter for antineoplastic radiation therapy: Secondary | ICD-10-CM | POA: Diagnosis not present

## 2015-11-07 LAB — COMPREHENSIVE METABOLIC PANEL
ALK PHOS: 81 U/L (ref 40–150)
ALT: 24 U/L (ref 0–55)
ANION GAP: 7 meq/L (ref 3–11)
AST: 19 U/L (ref 5–34)
Albumin: 3.2 g/dL — ABNORMAL LOW (ref 3.5–5.0)
BUN: 12 mg/dL (ref 7.0–26.0)
CO2: 27 meq/L (ref 22–29)
Calcium: 9.5 mg/dL (ref 8.4–10.4)
Chloride: 102 mEq/L (ref 98–109)
Creatinine: 0.8 mg/dL (ref 0.7–1.3)
Glucose: 76 mg/dl (ref 70–140)
POTASSIUM: 4.2 meq/L (ref 3.5–5.1)
SODIUM: 136 meq/L (ref 136–145)
TOTAL PROTEIN: 7.7 g/dL (ref 6.4–8.3)
Total Bilirubin: <0.3 mg/dL (ref 0.20–1.20)

## 2015-11-07 LAB — CBC WITH DIFFERENTIAL/PLATELET
BASO%: 1.1 % (ref 0.0–2.0)
BASOS ABS: 0 10*3/uL (ref 0.0–0.1)
EOS ABS: 0.1 10*3/uL (ref 0.0–0.5)
EOS%: 2 % (ref 0.0–7.0)
HCT: 34.7 % — ABNORMAL LOW (ref 38.4–49.9)
HGB: 11.2 g/dL — ABNORMAL LOW (ref 13.0–17.1)
LYMPH%: 24.1 % (ref 14.0–49.0)
MCH: 29.3 pg (ref 27.2–33.4)
MCHC: 32.3 g/dL (ref 32.0–36.0)
MCV: 90.6 fL (ref 79.3–98.0)
MONO#: 0.3 10*3/uL (ref 0.1–0.9)
MONO%: 9.6 % (ref 0.0–14.0)
NEUT%: 63.2 % (ref 39.0–75.0)
NEUTROS ABS: 1.7 10*3/uL (ref 1.5–6.5)
PLATELETS: 281 10*3/uL (ref 140–400)
RBC: 3.83 10*6/uL — AB (ref 4.20–5.82)
RDW: 14.3 % (ref 11.0–14.6)
WBC: 2.6 10*3/uL — AB (ref 4.0–10.3)
lymph#: 0.6 10*3/uL — ABNORMAL LOW (ref 0.9–3.3)

## 2015-11-07 MED ORDER — DIPHENHYDRAMINE HCL 50 MG/ML IJ SOLN
INTRAMUSCULAR | Status: AC
  Filled 2015-11-07: qty 1

## 2015-11-07 MED ORDER — FAMOTIDINE IN NACL 20-0.9 MG/50ML-% IV SOLN
20.0000 mg | Freq: Once | INTRAVENOUS | Status: AC
  Administered 2015-11-07: 20 mg via INTRAVENOUS

## 2015-11-07 MED ORDER — PALONOSETRON HCL INJECTION 0.25 MG/5ML
INTRAVENOUS | Status: AC
  Filled 2015-11-07: qty 5

## 2015-11-07 MED ORDER — SODIUM CHLORIDE 0.9 % IV SOLN
215.4000 mg | Freq: Once | INTRAVENOUS | Status: AC
  Administered 2015-11-07: 220 mg via INTRAVENOUS
  Filled 2015-11-07: qty 22

## 2015-11-07 MED ORDER — SODIUM CHLORIDE 0.9 % IV SOLN
Freq: Once | INTRAVENOUS | Status: AC
Start: 2015-11-07 — End: 2015-11-07
  Administered 2015-11-07: 13:00:00 via INTRAVENOUS

## 2015-11-07 MED ORDER — SODIUM CHLORIDE 0.9 % IV SOLN
20.0000 mg | Freq: Once | INTRAVENOUS | Status: AC
  Administered 2015-11-07: 20 mg via INTRAVENOUS
  Filled 2015-11-07: qty 2

## 2015-11-07 MED ORDER — DIPHENHYDRAMINE HCL 50 MG/ML IJ SOLN
50.0000 mg | Freq: Once | INTRAMUSCULAR | Status: AC
  Administered 2015-11-07: 50 mg via INTRAVENOUS

## 2015-11-07 MED ORDER — PALONOSETRON HCL INJECTION 0.25 MG/5ML
0.2500 mg | Freq: Once | INTRAVENOUS | Status: AC
  Administered 2015-11-07: 0.25 mg via INTRAVENOUS

## 2015-11-07 MED ORDER — FAMOTIDINE IN NACL 20-0.9 MG/50ML-% IV SOLN
INTRAVENOUS | Status: AC
  Filled 2015-11-07: qty 50

## 2015-11-07 MED ORDER — SODIUM CHLORIDE 0.9 % IV SOLN
45.0000 mg/m2 | Freq: Once | INTRAVENOUS | Status: AC
  Administered 2015-11-07: 84 mg via INTRAVENOUS
  Filled 2015-11-07: qty 14

## 2015-11-07 NOTE — Patient Instructions (Signed)
[facility] Discharge Instructions for Patients Receiving Chemotherapy  Today you received the following chemotherapy agents:  Taxol, Carboplatin  To help prevent nausea and vomiting after your treatment, we encourage you to take your nausea medication as prscribed.   If you develop nausea and vomiting that is not controlled by your nausea medication, call the clinic.   BELOW ARE SYMPTOMS THAT SHOULD BE REPORTED IMMEDIATELY:  *FEVER GREATER THAN 100.5 F  *CHILLS WITH OR WITHOUT FEVER  NAUSEA AND VOMITING THAT IS NOT CONTROLLED WITH YOUR NAUSEA MEDICATION  *UNUSUAL SHORTNESS OF BREATH  *UNUSUAL BRUISING OR BLEEDING  TENDERNESS IN MOUTH AND THROAT WITH OR WITHOUT PRESENCE OF ULCERS  *URINARY PROBLEMS  *BOWEL PROBLEMS  UNUSUAL RASH Items with * indicate a potential emergency and should be followed up as soon as possible.  Feel free to call the clinic you have any questions or concerns. The clinic phone number is [phone].  Please show the [facility] at check-in to the Emergency Department and triage nurse.

## 2015-11-08 ENCOUNTER — Encounter: Payer: Self-pay | Admitting: Radiation Oncology

## 2015-11-08 ENCOUNTER — Ambulatory Visit
Admission: RE | Admit: 2015-11-08 | Discharge: 2015-11-08 | Disposition: A | Discharge status: Home / Self Care | Payer: BLUE CROSS/BLUE SHIELD | Source: Ambulatory Visit | Attending: Radiation Oncology | Admitting: Radiation Oncology

## 2015-11-08 VITALS — BP 104/80 | HR 62 | Temp 98.0°F | Ht 73.0 in | Wt 134.9 lb

## 2015-11-08 DIAGNOSIS — C3412 Malignant neoplasm of upper lobe, left bronchus or lung: Secondary | ICD-10-CM

## 2015-11-08 DIAGNOSIS — Z510 Encounter for antineoplastic radiation therapy: Secondary | ICD-10-CM | POA: Diagnosis not present

## 2015-11-08 NOTE — Progress Notes (Signed)
  Radiation Oncology         (336) [phone] ________________________________  Name: Maurice Mcknight MRN: [id number]  Date: 11/08/2015  DOB: 12-Jun-1961   Weekly Radiation Therapy Management    ICD-9-CM ICD-10-CM   1. Malignant neoplasm of upper lobe of left lung (HCC) 162.3 C34.12      Current Dose: 34 Gy     Planned Dose:  60 Gy  Narrative . . . . . . . . The patient presents for routine under treatment assessment.    Mads Lord has completed 17 fractions to his left chest. He reports occasional sharp pain in his left chest with coughing. He does report having a dry, frequent, cough. He has been taking Nyquil at night. He has tessalon capsules, but has not been taking them.  He reports when he eats he feels like food is getting stuck in his chest. He denies fatigue. He does not have any skin irritation. He had chemotherapy yesterday.  No pain or discomfort with swallowing.                                  Set-up films were reviewed.                                 The chart was checked. Physical Findings. . .  height is '6\' 1"'$  (1.854 m) and weight is 134 lb 14.4 oz (61.19 kg). His oral temperature is 98 F (36.7 C). His blood pressure is 104/80 and his pulse is 62. His oxygen saturation is 100%. . Weight essentially stable.  No significant changes. Some wheezing noted bilaterally. The heart has a regular rhythm and rate.  Impression . . . . . . . The patient is tolerating radiation. Plan . . . . . . . . . . . . Continue treatment as planned.  ________________________________    [name], PhD, MD  This document serves as a record of services personally performed by Gery Pray, MD. It was created on his behalf by Darcus Austin, a trained medical scribe. The creation of this record is based on the scribe's personal observations and the provider's statements to them. This document has been checked and approved by the attending provider.

## 2015-11-08 NOTE — Progress Notes (Signed)
Maurice Mcknight has completed 17 fractions to his left chest.  He denies having any pain.  He does report having a dry, frequent cough.  He has been taking Nyquil at night.  He has tessalon capsules but has not been taking them.  He reports occasional sharp pain in his left chest with coughing.  He reports when he eats he feels like food is getting stuck in his chest.  He denies having fatigue.  He does not have any skin irritation.  He had chemotherapy yesterday.    BP 104/80 mmHg  Pulse 62  Temp(Src) 98 F (36.7 C) (Oral)  Ht '6\' 1"'$  (1.854 m)  Wt 134 lb 14.4 oz (61.19 kg)  BMI 17.80 kg/m2  SpO2 100%   Wt Readings from Last 3 Encounters:  11/08/15 134 lb 14.4 oz (61.19 kg)  11/01/15 131 lb 8 oz (59.648 kg)  10/31/15 130 lb 6.4 oz (59.149 kg)

## 2015-11-09 ENCOUNTER — Ambulatory Visit
Admission: RE | Admit: 2015-11-09 | Discharge: 2015-11-09 | Disposition: A | Discharge status: Home / Self Care | Payer: BLUE CROSS/BLUE SHIELD | Source: Ambulatory Visit | Attending: Radiation Oncology | Admitting: Radiation Oncology

## 2015-11-09 DIAGNOSIS — Z510 Encounter for antineoplastic radiation therapy: Secondary | ICD-10-CM | POA: Diagnosis not present

## 2015-11-10 ENCOUNTER — Ambulatory Visit
Admission: RE | Admit: 2015-11-10 | Discharge: 2015-11-10 | Disposition: A | Discharge status: Home / Self Care | Payer: BLUE CROSS/BLUE SHIELD | Source: Ambulatory Visit | Attending: Radiation Oncology | Admitting: Radiation Oncology

## 2015-11-10 DIAGNOSIS — Z510 Encounter for antineoplastic radiation therapy: Secondary | ICD-10-CM | POA: Diagnosis not present

## 2015-11-11 ENCOUNTER — Ambulatory Visit
Admission: RE | Admit: 2015-11-11 | Discharge: 2015-11-11 | Disposition: A | Discharge status: Home / Self Care | Payer: BLUE CROSS/BLUE SHIELD | Source: Ambulatory Visit | Attending: Radiation Oncology | Admitting: Radiation Oncology

## 2015-11-11 DIAGNOSIS — Z510 Encounter for antineoplastic radiation therapy: Secondary | ICD-10-CM | POA: Diagnosis not present

## 2015-11-14 ENCOUNTER — Ambulatory Visit (HOSPITAL_BASED_OUTPATIENT_CLINIC_OR_DEPARTMENT_OTHER): Payer: BLUE CROSS/BLUE SHIELD

## 2015-11-14 ENCOUNTER — Ambulatory Visit
Admission: RE | Admit: 2015-11-14 | Discharge: 2015-11-14 | Disposition: A | Discharge status: Home / Self Care | Payer: BLUE CROSS/BLUE SHIELD | Source: Ambulatory Visit | Attending: Radiation Oncology | Admitting: Radiation Oncology

## 2015-11-14 ENCOUNTER — Ambulatory Visit (HOSPITAL_BASED_OUTPATIENT_CLINIC_OR_DEPARTMENT_OTHER): Payer: BLUE CROSS/BLUE SHIELD | Admitting: Internal Medicine

## 2015-11-14 ENCOUNTER — Encounter: Payer: BLUE CROSS/BLUE SHIELD | Admitting: Nutrition

## 2015-11-14 ENCOUNTER — Other Ambulatory Visit: Payer: Self-pay | Admitting: *Deleted

## 2015-11-14 ENCOUNTER — Encounter: Payer: Self-pay | Admitting: *Deleted

## 2015-11-14 ENCOUNTER — Other Ambulatory Visit (HOSPITAL_BASED_OUTPATIENT_CLINIC_OR_DEPARTMENT_OTHER): Payer: BLUE CROSS/BLUE SHIELD

## 2015-11-14 ENCOUNTER — Encounter: Payer: Self-pay | Admitting: Internal Medicine

## 2015-11-14 VITALS — BP 103/73 | HR 67 | Temp 98.4°F | Resp 18 | Ht 73.0 in | Wt 128.1 lb

## 2015-11-14 DIAGNOSIS — Z510 Encounter for antineoplastic radiation therapy: Secondary | ICD-10-CM | POA: Diagnosis not present

## 2015-11-14 DIAGNOSIS — C3412 Malignant neoplasm of upper lobe, left bronchus or lung: Secondary | ICD-10-CM

## 2015-11-14 DIAGNOSIS — Z720 Tobacco use: Secondary | ICD-10-CM

## 2015-11-14 DIAGNOSIS — Z5111 Encounter for antineoplastic chemotherapy: Secondary | ICD-10-CM | POA: Diagnosis not present

## 2015-11-14 DIAGNOSIS — R1310 Dysphagia, unspecified: Secondary | ICD-10-CM | POA: Diagnosis not present

## 2015-11-14 LAB — CBC WITH DIFFERENTIAL/PLATELET
BASO%: 1 % (ref 0.0–2.0)
BASOS ABS: 0 10*3/uL (ref 0.0–0.1)
EOS ABS: 0.1 10*3/uL (ref 0.0–0.5)
EOS%: 2.7 % (ref 0.0–7.0)
HEMATOCRIT: 38.4 % (ref 38.4–49.9)
HEMOGLOBIN: 12.8 g/dL — AB (ref 13.0–17.1)
LYMPH#: 0.6 10*3/uL — AB (ref 0.9–3.3)
LYMPH%: 26.7 % (ref 14.0–49.0)
MCH: 30.1 pg (ref 27.2–33.4)
MCHC: 33.4 g/dL (ref 32.0–36.0)
MCV: 90.2 fL (ref 79.3–98.0)
MONO#: 0.3 10*3/uL (ref 0.1–0.9)
MONO%: 14.3 % — AB (ref 0.0–14.0)
NEUT%: 55.3 % (ref 39.0–75.0)
NEUTROS ABS: 1.1 10*3/uL — AB (ref 1.5–6.5)
Platelets: 251 10*3/uL (ref 140–400)
RBC: 4.26 10*6/uL (ref 4.20–5.82)
RDW: 14.9 % — AB (ref 11.0–14.6)
WBC: 2.1 10*3/uL — AB (ref 4.0–10.3)

## 2015-11-14 LAB — COMPREHENSIVE METABOLIC PANEL
ALT: 17 U/L (ref 0–55)
ANION GAP: 8 meq/L (ref 3–11)
AST: 15 U/L (ref 5–34)
Albumin: 3.5 g/dL (ref 3.5–5.0)
Alkaline Phosphatase: 92 U/L (ref 40–150)
BUN: 14.7 mg/dL (ref 7.0–26.0)
CALCIUM: 9.3 mg/dL (ref 8.4–10.4)
CHLORIDE: 99 meq/L (ref 98–109)
CO2: 25 meq/L (ref 22–29)
CREATININE: 0.9 mg/dL (ref 0.7–1.3)
Glucose: 82 mg/dl (ref 70–140)
POTASSIUM: 4 meq/L (ref 3.5–5.1)
Sodium: 132 mEq/L — ABNORMAL LOW (ref 136–145)
Total Bilirubin: 0.37 mg/dL (ref 0.20–1.20)
Total Protein: 8.2 g/dL (ref 6.4–8.3)

## 2015-11-14 MED ORDER — PALONOSETRON HCL INJECTION 0.25 MG/5ML
0.2500 mg | Freq: Once | INTRAVENOUS | Status: AC
  Administered 2015-11-14: 0.25 mg via INTRAVENOUS

## 2015-11-14 MED ORDER — DIPHENHYDRAMINE HCL 50 MG/ML IJ SOLN
INTRAMUSCULAR | Status: AC
  Filled 2015-11-14: qty 1

## 2015-11-14 MED ORDER — SODIUM CHLORIDE 0.9 % IV SOLN
Freq: Once | INTRAVENOUS | Status: AC
  Administered 2015-11-14: 11:00:00 via INTRAVENOUS

## 2015-11-14 MED ORDER — PALONOSETRON HCL INJECTION 0.25 MG/5ML
INTRAVENOUS | Status: AC
  Filled 2015-11-14: qty 5

## 2015-11-14 MED ORDER — SUCRALFATE 1 GM/10ML PO SUSP: 1.0000 g | Freq: Three times a day (TID) | ORAL | Status: DC

## 2015-11-14 MED ORDER — DIPHENHYDRAMINE HCL 50 MG/ML IJ SOLN
50.0000 mg | Freq: Once | INTRAMUSCULAR | Status: AC
  Administered 2015-11-14: 50 mg via INTRAVENOUS

## 2015-11-14 MED ORDER — FAMOTIDINE IN NACL 20-0.9 MG/50ML-% IV SOLN
INTRAVENOUS | Status: AC
  Filled 2015-11-14: qty 50

## 2015-11-14 MED ORDER — SODIUM CHLORIDE 0.9 % IV SOLN
20.0000 mg | Freq: Once | INTRAVENOUS | Status: AC
  Administered 2015-11-14: 20 mg via INTRAVENOUS
  Filled 2015-11-14: qty 2

## 2015-11-14 MED ORDER — FAMOTIDINE IN NACL 20-0.9 MG/50ML-% IV SOLN
20.0000 mg | Freq: Once | INTRAVENOUS | Status: AC
  Administered 2015-11-14: 20 mg via INTRAVENOUS

## 2015-11-14 MED ORDER — SODIUM CHLORIDE 0.9 % IV SOLN
215.4000 mg | Freq: Once | INTRAVENOUS | Status: AC
  Administered 2015-11-14: 220 mg via INTRAVENOUS
  Filled 2015-11-14: qty 22

## 2015-11-14 MED ORDER — SODIUM CHLORIDE 0.9 % IV SOLN
45.0000 mg/m2 | Freq: Once | INTRAVENOUS | Status: AC
  Administered 2015-11-14: 84 mg via INTRAVENOUS
  Filled 2015-11-14: qty 14

## 2015-11-14 MED FILL — CARAFATE 1 GM/10 ML SUSP: 1 | 10 days supply | Qty: 420 | Fill #0

## 2015-11-14 NOTE — Progress Notes (Signed)
Oncology Nurse Navigator Documentation  Oncology Nurse Navigator Flowsheets 11/14/2015  Navigator Location [facility]  Treatment Phase Treatment  Barriers/Navigation Needs Education  Education Other  Interventions Education Method  Education Method Verbal  Acuity Level 2  Acuity Level 2 Educational needs  Time Spent with Patient 30   Spoke with patient today at [facility].  He is complaining of painful swallowing.  [name] prescribed Karafate.  I explained medication and re-interated how to take.

## 2015-11-14 NOTE — Progress Notes (Signed)
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  PRIOR THERAPY: None.  CURRENT THERAPY: Concurrent chemoradiation with weekly carboplatin for AUC of 2 and paclitaxel 45 MG/M2. First treatment was given on 10/10/2015. Status post 4 cycles.  INTERVAL HISTORY: Maurice Mcknight 55 y.o. male returns to the clinic today for follow-up visit. The patient is currently undergoing a course of concurrent chemoradiation with weekly carboplatin and paclitaxel. He is tolerating his treatment fairly well with no significant adverse effects except for the sore throat and odynophagia. He notes improvement in his breathing. He denied having any significant chest pain, shortness breath, cough or hemoptysis. He denied having any significant fever or chills. He is here today to start cycle #5.  MEDICAL HISTORY: Past Medical History  Diagnosis Date  . Malnutrition ([place]) 09/28/2015  . Needs smoking cessation education 09/28/2015  . Encounter for antineoplastic chemotherapy 10/17/2015    ALLERGIES:  has No Known Allergies.  MEDICATIONS:  Current Outpatient Prescriptions  Medication Sig Dispense Refill  . benzonatate (TESSALON) 100 MG capsule Take 1 capsule (100 mg total) by mouth 3 (three) times daily as needed for cough. (Patient not taking: Reported on 10/18/2015) 20 capsule 0  . prochlorperazine (COMPAZINE) 10 MG tablet Take 1 tablet (10 mg total) by mouth every 6 (six) hours as needed for nausea or vomiting. (Patient not taking: Reported on 10/18/2015) 30 tablet 0  . Pseudoeph-Doxylamine-DM-APAP (NYQUIL PO) Take by mouth.     No current facility-administered medications for this visit.     SURGICAL HISTORY:  Past Surgical History  Procedure Laterality Date  . Video bronchoscopy Bilateral 09/09/2015    Diagnosis    REVIEW OF SYSTEMS:  A comprehensive review of systems was negative except for: Constitutional: positive for fatigue and weight loss Gastrointestinal: positive for odynophagia   PHYSICAL EXAMINATION: General appearance: alert, cooperative, fatigued and no distress Head: Normocephalic, without obvious abnormality, atraumatic Neck: no adenopathy, no JVD, supple, symmetrical, trachea midline and thyroid not enlarged, symmetric, no tenderness/mass/nodules Lymph nodes: Cervical, supraclavicular, and axillary nodes normal. Resp: diminished breath sounds LUL and wheezes bilaterally Back: symmetric, no curvature. ROM normal. No CVA tenderness. Cardio: regular rate and rhythm, S1, S2 normal, no murmur, click, rub or gallop GI: soft, non-tender; bowel sounds normal; no masses,  no organomegaly Extremities: extremities normal, atraumatic, no cyanosis or edema Neurologic: Alert and oriented X 3, normal strength and tone. Normal symmetric reflexes. Normal coordination and gait  ECOG PERFORMANCE STATUS: 1 - Symptomatic but completely ambulatory  Blood pressure 103/73, pulse 67, temperature 98.4 F (36.9 C), temperature source Oral, resp. rate 18, height '6\' 1"'$  (1.854 m), weight 128 lb 1.6 oz (58.106 kg), SpO2 100 %.  LABORATORY DATA: Lab Results  Component Value Date   WBC 2.1* 11/14/2015   HGB 12.8* 11/14/2015   HCT 38.4 11/14/2015   MCV 90.2 11/14/2015   PLT 251 11/14/2015      Chemistry      Component Value Date/Time   NA 136 11/07/2015 1120   NA 134* 10/24/2015 1206   K 4.2 11/07/2015 1120   K 4.0 10/24/2015 1206   CL 100* 10/24/2015 1206   CO2 27 11/07/2015 1120   CO2 22 10/24/2015 1206  BUN 12.0 11/07/2015 1120   BUN 16 10/24/2015 1206   CREATININE 0.8 11/07/2015 1120   CREATININE 1.04 10/24/2015 1206      Component Value Date/Time   CALCIUM  9.5 11/07/2015 1120   CALCIUM 9.2 10/24/2015 1206   ALKPHOS 81 11/07/2015 1120   ALKPHOS 100 09/09/2015 0403   AST 19 11/07/2015 1120   AST 29 09/09/2015 0403   ALT 24 11/07/2015 1120   ALT 22 09/09/2015 0403   BILITOT <0.30 11/07/2015 1120   BILITOT 0.5 09/09/2015 0403       RADIOGRAPHIC STUDIES: Ct Angio Chest Pe W/cm &/or Wo Cm  10/24/2015  CLINICAL DATA:  Near syncope, vomiting, tachycardia, LEFT lung cancer undergoing current chemotherapy and radiation therapy, concern for pulmonary embolism EXAM: CT ANGIOGRAPHY CHEST WITH CONTRAST TECHNIQUE: Multidetector CT imaging of the chest was performed using the standard protocol during bolus administration of intravenous contrast. Multiplanar CT image reconstructions and MIPs were obtained to evaluate the vascular anatomy. CONTRAST:  85 cc Isovue 370 IV COMPARISON:  CT chest 09/08/2015 FINDINGS: RIGHT-side aortic arch extending retro esophageal. Descending thoracic aorta is LEFT of midline. Aorta normal caliber without aneurysm or dissection. Increased abnormal soft tissue versus adenopathy at LEFT hilum, 3.7 x 2.4 cm image 39. Decrease in size of AP window lymph node 6 mm short axis image 25 previously 14 mm short axis. Pulmonary arteries well opacified and patent. No evidence of pulmonary embolism. Visualized upper abdomen unremarkable. LEFT upper lobe atelectasis with hyper expansion of LEFT lower lobe. Scattered peribronchovascular infiltrates LEFT lower lobe which may represent infection lymphangitic tumor spread is not excluded. Scattered areas of subsegmental atelectasis and peribronchial thickening LEFT lower lobe. No additional infiltrate, pleural effusion, pneumothorax or additional pulmonary mass/ nodule. Partial opacification of bronchitis superior segment of LEFT lower lobe. No acute osseous findings. Review of the MIP images confirms the above findings. IMPRESSION: No evidence of pulmonary embolism. LEFT upper lobe atelectasis with hyper  expansion of LEFT lower lobe. Scattered infiltrates and atelectasis in LEFT lower lobe, may represent infection but lymphangitic tumor spread is not excluded with this appearance. RIGHT-side aortic arch coursing retroesophageal. Decreased size of an enlarged AP window lymph node since the previous exam with additional abnormal soft tissue versus adenopathy at LEFT hilum increased in size. Electronically Signed   By: Lavonia Dana M.D.   On: 10/24/2015 16:34   Nm Pet Image Initial (pi) Skull Base To Thigh  10/25/2015  CLINICAL DATA:  Subsequent treatment strategy for lung cancer. EXAM: NUCLEAR MEDICINE PET SKULL BASE TO THIGH TECHNIQUE: 6.8 mCi F-18 FDG was injected intravenously. Full-ring PET imaging was performed from the skull base to thigh after the radiotracer. CT data was obtained and used for attenuation correction and anatomic localization. FASTING BLOOD GLUCOSE:  Value: 89 Mg/dl COMPARISON:  Chest CT 10/24/2015.  Chest CT 09/08/2015. FINDINGS: NECK No hypermetabolic lymph nodes in the neck. CHEST The dominant hypermetabolic lesion on today's study is in the left hilum with SUV max = 18.2. Although the hilar regions are difficult to assess given the lack of intravenous contrast material, this roughly corresponds to a 3.0 x 2.9 cm left hilar lesion, better measured on the previous study with contrast material at 3.7 x 2.4 cm. This lesion includes a left mainstem bronchus. There is associated left upper lobe collapse. Hypermetabolic focus in the region of the left thoracic inlet has SUV max = 5.2 although no underlying lesion cannot be identified on the CT images today. Patchy airspace disease is  seen in the left lower lobe with confluent consolidation posteriorly (image 55 series 6). This confluent posterior airspace disease in the lower lobe is hypermetabolic with SUV max = 7. Uptake in the distal esophagus is considered physiologic. ABDOMEN/PELVIS Potential low level uptake is identified in the left  adrenal gland with SUV max = 3.5. No discrete adrenal nodule by CT imaging. Foci of uptake along the pelvic sidewall bilaterally without underlying CT correlate felt to represent activity within the ureters. SKELETON No focal hypermetabolic activity to suggest skeletal metastasis. IMPRESSION: Markedly hypermetabolic left hilar mass with very obstruction and left upper lobe collapse. This is assisted with hyper metabolism in the region of the left thoracic inlet hands associated with patchy opacity in the posterior left lower lobe. The lower lobe disease may be related to infection/inflammation, but the uptake is relatively high level and tumor spread would be a consideration. Apparent low level uptake in the left adrenal gland without an underlying nodule evident by CT imaging. Close attention on follow-up recommended. Electronically Signed   By: Misty Stanley M.D.   On: 10/25/2015 14:42    ASSESSMENT AND PLAN: This is a very pleasant 55 years old African-American male with questionable as stage IIIa non-small cell lung cancer, squamous cell carcinoma with large obstructing left upper lobe lung mass and questionable mediastinal lymphadenopathy diagnosed in February 2017. The patient is currently undergoing concurrent chemoradiation with weekly carboplatin and paclitaxel status post 2 cycles. He was treating the treatment well except for sore throat, odynophagia as well as weight loss. I recommended for him to proceed with cycle #5 today as a scheduled. Further odynophagia, I started the patient on Carafate 1 g by mouth 4 times daily. For smoke cessation, I strongly encouraged the patient to quit smoking and offered him a smoke cessation program. The patient would come back for follow-up visit in 2 weeks for reevaluation and management of any adverse effect of his treatment. He was advised to call immediately if he has any concerning symptoms in the interval. The patient voices understanding of current  disease status and treatment options and is in agreement with the current care plan.  All questions were answered. The patient knows to call the clinic with any problems, questions or concerns. We can certainly see the patient much sooner if necessary.  Disclaimer: This note was dictated with voice recognition software. Similar sounding words can inadvertently be transcribed and may not be corrected upon review.

## 2015-11-14 NOTE — Patient Instructions (Signed)
[facility] Discharge Instructions for Patients Receiving Chemotherapy  Today you received the following chemotherapy agents:  Taxol and Carboplatin  To help prevent nausea and vomiting after your treatment, we encourage you to take your nausea medication as prscribed.   If you develop nausea and vomiting that is not controlled by your nausea medication, call the clinic.   BELOW ARE SYMPTOMS THAT SHOULD BE REPORTED IMMEDIATELY:  *FEVER GREATER THAN 100.5 F  *CHILLS WITH OR WITHOUT FEVER  NAUSEA AND VOMITING THAT IS NOT CONTROLLED WITH YOUR NAUSEA MEDICATION  *UNUSUAL SHORTNESS OF BREATH  *UNUSUAL BRUISING OR BLEEDING  TENDERNESS IN MOUTH AND THROAT WITH OR WITHOUT PRESENCE OF ULCERS  *URINARY PROBLEMS  *BOWEL PROBLEMS  UNUSUAL RASH Items with * indicate a potential emergency and should be followed up as soon as possible.  Feel free to call the clinic you have any questions or concerns. The clinic phone number is [phone].  Please show the [facility] at check-in to the Emergency Department and triage nurse.

## 2015-11-14 NOTE — Progress Notes (Signed)
Okay to treat with today's lab values per [name].

## 2015-11-15 ENCOUNTER — Ambulatory Visit
Admission: RE | Admit: 2015-11-15 | Discharge: 2015-11-15 | Disposition: A | Discharge status: Home / Self Care | Payer: BLUE CROSS/BLUE SHIELD | Source: Ambulatory Visit | Attending: Radiation Oncology | Admitting: Radiation Oncology

## 2015-11-15 ENCOUNTER — Encounter: Payer: Self-pay | Admitting: Radiation Oncology

## 2015-11-15 ENCOUNTER — Encounter: Payer: Self-pay | Admitting: Internal Medicine

## 2015-11-15 VITALS — BP 116/75 | HR 61 | Temp 97.6°F | Ht 73.0 in | Wt 129.5 lb

## 2015-11-15 DIAGNOSIS — C3412 Malignant neoplasm of upper lobe, left bronchus or lung: Secondary | ICD-10-CM

## 2015-11-15 DIAGNOSIS — Z510 Encounter for antineoplastic radiation therapy: Secondary | ICD-10-CM | POA: Diagnosis not present

## 2015-11-15 NOTE — Progress Notes (Signed)
Maurice Mcknight has completed 22 fractions to his left chest.  He is reporting burning in his chest with swallowing.  He started taking carafate this morning.  He denies having shortness of breath.  He reports having a dry cough.  The skin on his chest and back is intact.  He denies having fatigue.  He had chemotherapy yesterday.  BP 116/75 mmHg  Pulse 61  Temp(Src) 97.6 F (36.4 C) (Oral)  Ht '6\' 1"'$  (1.854 m)  Wt 129 lb 8 oz (58.741 kg)  BMI 17.09 kg/m2  SpO2 100%   Wt Readings from Last 3 Encounters:  11/15/15 129 lb 8 oz (58.741 kg)  11/14/15 128 lb 1.6 oz (58.106 kg)  11/08/15 134 lb 14.4 oz (61.19 kg)

## 2015-11-15 NOTE — Progress Notes (Signed)
Sent prior auth to [employer] for CARAFATE) 1 GM/10ML suspension

## 2015-11-15 NOTE — Progress Notes (Signed)
  Radiation Oncology         [phone] ________________________________  Name: Maurice Mcknight MRN: [id number]  Date: 11/15/2015  DOB: [birth year]  Weekly Radiation Therapy Management    ICD-9-CM ICD-10-CM   1. Malignant neoplasm of upper lobe of left lung (HCC) 162.3 C34.12      Current Dose: 44 Gy     Planned Dose:  60 Gy  Narrative . . . . . . . . The patient presents for routine under treatment assessment.   Maurice Mcknight has completed 22 fractions to his left chest. He is reporting burning in his chest with swallowing. He started taking carafate this morning. He denies having shortness of breath. He reports having a dry cough. The skin on his chest and back is intact. He denies having fatigue. He had chemotherapy yesterday.                                 Set-up films were reviewed.                                 The chart was checked. Physical Findings. . .  height is '6\' 1"'$  (1.854 m) and weight is 129 lb 8 oz (58.741 kg). His oral temperature is 97.6 F (36.4 C). His blood pressure is 116/75 and his pulse is 61. His oxygen saturation is 100%. . Weight essentially stable.  No significant changes. Lungs are clear. The heart has a regular rhythm and rate. Impression . . . . . . . The patient is tolerating radiation. Plan . . . . . . . . . . . . Continue treatment as planned.  ________________________________    [name], PhD, MD  This document serves as a record of services personally performed by Gery Pray, MD. It was created on his behalf by Darcus Austin, a trained medical scribe. The creation of this record is based on the scribe's personal observations and the provider's statements to them. This document has been checked and approved by the attending provider.

## 2015-11-16 ENCOUNTER — Telehealth: Payer: Self-pay | Admitting: *Deleted

## 2015-11-16 ENCOUNTER — Ambulatory Visit
Admission: RE | Admit: 2015-11-16 | Discharge: 2015-11-16 | Disposition: A | Discharge status: Home / Self Care | Payer: BLUE CROSS/BLUE SHIELD | Source: Ambulatory Visit | Attending: Radiation Oncology | Admitting: Radiation Oncology

## 2015-11-16 DIAGNOSIS — Z510 Encounter for antineoplastic radiation therapy: Secondary | ICD-10-CM | POA: Diagnosis not present

## 2015-11-16 NOTE — Telephone Encounter (Signed)
Oncology Nurse Navigator Documentation  Oncology Nurse Navigator Flowsheets 11/16/2015  Navigator Location [facility]  Treatment Phase Treatment  Barriers/Navigation Needs Education  Interventions Education Method  Education Method Verbal  Acuity Level 2  Acuity Level 2 Educational needs  Time Spent with Patient 15   I called today to follow up with patient regarding pain when he swallows.  I asked him if he was taking the Carafate and if that was working.  He stated it was not working but "the other pills" were working.  I asked him the name of the pills but he did not know that information.  He stated he would bring that to the cancer center so we know what helps him.  He stated he would bring them.  I asked him to call cancer center is continued pain.

## 2015-11-17 ENCOUNTER — Ambulatory Visit
Admission: RE | Admit: 2015-11-17 | Discharge: 2015-11-17 | Disposition: A | Discharge status: Home / Self Care | Payer: BLUE CROSS/BLUE SHIELD | Source: Ambulatory Visit | Attending: Radiation Oncology | Admitting: Radiation Oncology

## 2015-11-17 DIAGNOSIS — Z510 Encounter for antineoplastic radiation therapy: Secondary | ICD-10-CM | POA: Diagnosis not present

## 2015-11-18 ENCOUNTER — Encounter: Payer: Self-pay | Admitting: Internal Medicine

## 2015-11-18 ENCOUNTER — Ambulatory Visit
Admission: RE | Admit: 2015-11-18 | Discharge: 2015-11-18 | Disposition: A | Discharge status: Home / Self Care | Payer: BLUE CROSS/BLUE SHIELD | Source: Ambulatory Visit | Attending: Radiation Oncology | Admitting: Radiation Oncology

## 2015-11-18 DIAGNOSIS — Z510 Encounter for antineoplastic radiation therapy: Secondary | ICD-10-CM | POA: Diagnosis not present

## 2015-11-18 NOTE — Progress Notes (Signed)
sent to [employer]- denied. sent staff message to see what was tried and failed to send an appeal

## 2015-11-21 ENCOUNTER — Encounter: Payer: Self-pay | Admitting: *Deleted

## 2015-11-21 ENCOUNTER — Other Ambulatory Visit (HOSPITAL_BASED_OUTPATIENT_CLINIC_OR_DEPARTMENT_OTHER): Payer: BLUE CROSS/BLUE SHIELD

## 2015-11-21 ENCOUNTER — Ambulatory Visit: Payer: BLUE CROSS/BLUE SHIELD

## 2015-11-21 ENCOUNTER — Encounter: Payer: BLUE CROSS/BLUE SHIELD | Admitting: Nutrition

## 2015-11-21 ENCOUNTER — Ambulatory Visit
Admission: RE | Admit: 2015-11-21 | Discharge: 2015-11-21 | Disposition: A | Discharge status: Home / Self Care | Payer: BLUE CROSS/BLUE SHIELD | Source: Ambulatory Visit | Attending: Radiation Oncology | Admitting: Radiation Oncology

## 2015-11-21 ENCOUNTER — Ambulatory Visit: Payer: BLUE CROSS/BLUE SHIELD | Admitting: Internal Medicine

## 2015-11-21 DIAGNOSIS — C3412 Malignant neoplasm of upper lobe, left bronchus or lung: Secondary | ICD-10-CM

## 2015-11-21 DIAGNOSIS — Z510 Encounter for antineoplastic radiation therapy: Secondary | ICD-10-CM | POA: Diagnosis not present

## 2015-11-21 LAB — CBC WITH DIFFERENTIAL/PLATELET
BASO%: 1.2 % (ref 0.0–2.0)
BASOS ABS: 0 10*3/uL (ref 0.0–0.1)
EOS ABS: 0 10*3/uL (ref 0.0–0.5)
EOS%: 1.7 % (ref 0.0–7.0)
HCT: 35.1 % — ABNORMAL LOW (ref 38.4–49.9)
HEMOGLOBIN: 11.5 g/dL — AB (ref 13.0–17.1)
LYMPH%: 25.1 % (ref 14.0–49.0)
MCH: 29.5 pg (ref 27.2–33.4)
MCHC: 32.6 g/dL (ref 32.0–36.0)
MCV: 90.3 fL (ref 79.3–98.0)
MONO#: 0.3 10*3/uL (ref 0.1–0.9)
MONO%: 15.7 % — AB (ref 0.0–14.0)
NEUT#: 1 10*3/uL — ABNORMAL LOW (ref 1.5–6.5)
NEUT%: 56.3 % (ref 39.0–75.0)
Platelets: 213 10*3/uL (ref 140–400)
RBC: 3.89 10*6/uL — ABNORMAL LOW (ref 4.20–5.82)
RDW: 15.5 % — AB (ref 11.0–14.6)
WBC: 1.8 10*3/uL — ABNORMAL LOW (ref 4.0–10.3)
lymph#: 0.5 10*3/uL — ABNORMAL LOW (ref 0.9–3.3)

## 2015-11-21 LAB — COMPREHENSIVE METABOLIC PANEL
ALT: 23 U/L (ref 0–55)
ANION GAP: 8 meq/L (ref 3–11)
AST: 17 U/L (ref 5–34)
Albumin: 3.2 g/dL — ABNORMAL LOW (ref 3.5–5.0)
Alkaline Phosphatase: 82 U/L (ref 40–150)
BUN: 12.3 mg/dL (ref 7.0–26.0)
CHLORIDE: 103 meq/L (ref 98–109)
CO2: 25 meq/L (ref 22–29)
CREATININE: 0.8 mg/dL (ref 0.7–1.3)
Calcium: 8.8 mg/dL (ref 8.4–10.4)
Glucose: 69 mg/dl — ABNORMAL LOW (ref 70–140)
Potassium: 3.9 mEq/L (ref 3.5–5.1)
Sodium: 136 mEq/L (ref 136–145)
Total Bilirubin: <0.3 mg/dL (ref 0.20–1.20)
Total Protein: 7.1 g/dL (ref 6.4–8.3)

## 2015-11-21 NOTE — Progress Notes (Signed)
ANC: 1.0. Per MD Mohamed, hold chemotherapy and reschedule for next week.

## 2015-11-22 ENCOUNTER — Ambulatory Visit
Admission: RE | Admit: 2015-11-22 | Discharge: 2015-11-22 | Disposition: A | Discharge status: Home / Self Care | Payer: BLUE CROSS/BLUE SHIELD | Source: Ambulatory Visit | Attending: Radiation Oncology | Admitting: Radiation Oncology

## 2015-11-22 ENCOUNTER — Ambulatory Visit: Payer: BLUE CROSS/BLUE SHIELD

## 2015-11-22 ENCOUNTER — Encounter: Payer: Self-pay | Admitting: Radiation Oncology

## 2015-11-22 VITALS — BP 102/75 | HR 62 | Temp 97.6°F | Ht 73.0 in | Wt 137.1 lb

## 2015-11-22 DIAGNOSIS — Z510 Encounter for antineoplastic radiation therapy: Secondary | ICD-10-CM | POA: Diagnosis not present

## 2015-11-22 DIAGNOSIS — C3412 Malignant neoplasm of upper lobe, left bronchus or lung: Secondary | ICD-10-CM

## 2015-11-22 MED ORDER — RADIAPLEXRX EX GEL
Freq: Once | CUTANEOUS | Status: AC
  Administered 2015-11-22: 11:00:00 via TOPICAL

## 2015-11-22 NOTE — Progress Notes (Signed)
Weekly Management Note Current Dose: 54 Gy  Projected Dose: 60 Gy   Narrative:  The patient presents for routine under treatment assessment.  CBCT/MVCT images/Port film x-rays were reviewed.  The chart was checked. Maurice Mcknight has completed 27 fractions to his left chest. He reports having pain with swallowing. He has not been taking Carafate because he put it in the fridge and it is really thick. He has been taking ibuprofen BID and aleve BID and has been eating a lot of spaghetti and ice pops. He reports his cough is better. He denies having hemoptysis. He denies having shortness of breath and fatigue. He did not have chemotherapy yesterday due to his blood count (ANC of 1.0). He has hyperpigmentation on his left upper back. He is using radiplex and has been given a refill.  Physical Findings:  Unchanged. He has minimal hyperpigmentation on his upper back.  Vitals:  Filed Vitals:   11/22/15 1110  BP: 102/75  Pulse: 62  Temp: 97.6 F (36.4 C)   Weight:  Wt Readings from Last 3 Encounters:  11/22/15 137 lb 1.6 oz (62.188 kg)  11/15/15 129 lb 8 oz (58.741 kg)  11/14/15 128 lb 1.6 oz (58.106 kg)   Lab Results  Component Value Date   WBC 1.8* 11/21/2015   HGB 11.5* 11/21/2015   HCT 35.1* 11/21/2015   MCV 90.3 11/21/2015   PLT 213 11/21/2015   Lab Results  Component Value Date   CREATININE 0.8 11/21/2015   BUN 12.3 11/21/2015   NA 136 11/21/2015   K 3.9 11/21/2015   CL 100* 10/24/2015   CO2 25 11/21/2015     Impression:  The patient is tolerating radiation.  Plan:  Continue treatment as planned. Continues using Radiaplex. He finishes treatment 11/25/2015. He was given a one month follow up appointment.    ------------------------------------------------  Thea Silversmith, MD    This document serves as a record of services personally performed by Thea Silversmith, MD. It was created on her behalf by  Lendon Collar, a trained medical scribe. The creation of this record is  based on the scribe's personal observations and the provider's statements to them. This document has been checked and approved by the attending provider.

## 2015-11-22 NOTE — Progress Notes (Addendum)
Maurice Mcknight has completed 27 fractions to his left chest.  He reports having pain with swallowing.  He has not been taking Carafate because he put it in the fridge and it is really thick.  He has been taking ibuprofen BID and aleve BID and has been eating a lot of spaghetti and ice pops.  He reports his cough is better.  He denies having hemoptysis.  He denies having shortness of breath and fatigue.  He did not have chemotherapy yesterday due to his blood count (ANC of 1.0).  He has hyperpigmentation on his left upper back.  He is using radiplex and has been given a refill.  BP 102/75 mmHg  Pulse 62  Temp(Src) 97.6 F (36.4 C) (Oral)  Ht '6\' 1"'$  (1.854 m)  Wt 137 lb 1.6 oz (62.188 kg)  BMI 18.09 kg/m2  SpO2 100%   Wt Readings from Last 3 Encounters:  11/22/15 137 lb 1.6 oz (62.188 kg)  11/15/15 129 lb 8 oz (58.741 kg)  11/14/15 128 lb 1.6 oz (58.106 kg)

## 2015-11-23 ENCOUNTER — Ambulatory Visit
Admission: RE | Admit: 2015-11-23 | Discharge: 2015-11-23 | Disposition: A | Discharge status: Home / Self Care | Payer: BLUE CROSS/BLUE SHIELD | Source: Ambulatory Visit | Attending: Radiation Oncology | Admitting: Radiation Oncology

## 2015-11-23 ENCOUNTER — Ambulatory Visit: Payer: BLUE CROSS/BLUE SHIELD

## 2015-11-23 DIAGNOSIS — Z510 Encounter for antineoplastic radiation therapy: Secondary | ICD-10-CM | POA: Diagnosis not present

## 2015-11-24 ENCOUNTER — Ambulatory Visit
Admission: RE | Admit: 2015-11-24 | Discharge: 2015-11-24 | Disposition: A | Discharge status: Home / Self Care | Payer: BLUE CROSS/BLUE SHIELD | Source: Ambulatory Visit | Attending: Radiation Oncology | Admitting: Radiation Oncology

## 2015-11-24 DIAGNOSIS — Z510 Encounter for antineoplastic radiation therapy: Secondary | ICD-10-CM | POA: Diagnosis not present

## 2015-11-25 ENCOUNTER — Ambulatory Visit
Admission: RE | Admit: 2015-11-25 | Discharge: 2015-11-25 | Disposition: A | Discharge status: Home / Self Care | Payer: BLUE CROSS/BLUE SHIELD | Source: Ambulatory Visit | Attending: Radiation Oncology | Admitting: Radiation Oncology

## 2015-11-25 ENCOUNTER — Encounter: Payer: Self-pay | Admitting: Radiation Oncology

## 2015-11-25 DIAGNOSIS — Z510 Encounter for antineoplastic radiation therapy: Secondary | ICD-10-CM | POA: Diagnosis not present

## 2015-11-29 ENCOUNTER — Telehealth: Payer: Self-pay | Admitting: Internal Medicine

## 2015-11-29 ENCOUNTER — Ambulatory Visit (HOSPITAL_BASED_OUTPATIENT_CLINIC_OR_DEPARTMENT_OTHER): Payer: BLUE CROSS/BLUE SHIELD | Admitting: Internal Medicine

## 2015-11-29 ENCOUNTER — Encounter: Payer: Self-pay | Admitting: Internal Medicine

## 2015-11-29 ENCOUNTER — Other Ambulatory Visit (HOSPITAL_BASED_OUTPATIENT_CLINIC_OR_DEPARTMENT_OTHER): Payer: BLUE CROSS/BLUE SHIELD

## 2015-11-29 ENCOUNTER — Ambulatory Visit: Payer: BLUE CROSS/BLUE SHIELD

## 2015-11-29 VITALS — BP 113/78 | HR 70 | Temp 98.3°F | Resp 18 | Ht 73.0 in | Wt 134.9 lb

## 2015-11-29 DIAGNOSIS — Z720 Tobacco use: Secondary | ICD-10-CM

## 2015-11-29 DIAGNOSIS — C3412 Malignant neoplasm of upper lobe, left bronchus or lung: Secondary | ICD-10-CM

## 2015-11-29 DIAGNOSIS — Z5111 Encounter for antineoplastic chemotherapy: Secondary | ICD-10-CM

## 2015-11-29 LAB — COMPREHENSIVE METABOLIC PANEL
ALBUMIN: 3.1 g/dL — AB (ref 3.5–5.0)
ALT: 19 U/L (ref 0–55)
AST: 17 U/L (ref 5–34)
Alkaline Phosphatase: 79 U/L (ref 40–150)
Anion Gap: 4 mEq/L (ref 3–11)
BUN: 11.2 mg/dL (ref 7.0–26.0)
CO2: 29 meq/L (ref 22–29)
Calcium: 8.7 mg/dL (ref 8.4–10.4)
Chloride: 106 mEq/L (ref 98–109)
Creatinine: 0.8 mg/dL (ref 0.7–1.3)
GLUCOSE: 77 mg/dL (ref 70–140)
POTASSIUM: 3.8 meq/L (ref 3.5–5.1)
Sodium: 139 mEq/L (ref 136–145)
Total Bilirubin: <0.3 mg/dL (ref 0.20–1.20)
Total Protein: 7 g/dL (ref 6.4–8.3)

## 2015-11-29 LAB — CBC WITH DIFFERENTIAL/PLATELET
BASO%: 0.8 % (ref 0.0–2.0)
BASOS ABS: 0 10*3/uL (ref 0.0–0.1)
EOS ABS: 0 10*3/uL (ref 0.0–0.5)
EOS%: 1.1 % (ref 0.0–7.0)
HEMATOCRIT: 36.6 % — AB (ref 38.4–49.9)
HGB: 11.8 g/dL — ABNORMAL LOW (ref 13.0–17.1)
LYMPH#: 0.7 10*3/uL — AB (ref 0.9–3.3)
LYMPH%: 30.9 % (ref 14.0–49.0)
MCH: 29.5 pg (ref 27.2–33.4)
MCHC: 32.3 g/dL (ref 32.0–36.0)
MCV: 91.3 fL (ref 79.3–98.0)
MONO#: 0.6 10*3/uL (ref 0.1–0.9)
MONO%: 24.7 % — ABNORMAL HIGH (ref 0.0–14.0)
NEUT#: 1 10*3/uL — ABNORMAL LOW (ref 1.5–6.5)
NEUT%: 42.5 % (ref 39.0–75.0)
Platelets: 154 10*3/uL (ref 140–400)
RBC: 4.01 10*6/uL — ABNORMAL LOW (ref 4.20–5.82)
RDW: 17.9 % — ABNORMAL HIGH (ref 11.0–14.6)
WBC: 2.3 10*3/uL — ABNORMAL LOW (ref 4.0–10.3)

## 2015-11-29 NOTE — Telephone Encounter (Signed)
Gave pt apt & avs, tx cancelled per pof

## 2015-11-29 NOTE — Progress Notes (Signed)
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  PRIOR THERAPY: None.  CURRENT THERAPY: Concurrent chemoradiation with weekly carboplatin for AUC of 2 and paclitaxel 45 MG/M2. First treatment was given on 10/10/2015. Status post 5 weeks of treatment.  INTERVAL HISTORY: Maurice Mcknight 55 y.o. male returns to the clinic today for follow-up visit. The patient is currently undergoing a course of concurrent chemoradiation with weekly carboplatin and paclitaxel. He is tolerating his treatment fairly well with no significant adverse effects except for the sore throat and odynophagia. She completed the radiotherapy on 11/25/2015. He denied having any significant chest pain, shortness of breath, cough or hemoptysis. He denied having any significant fever or chills.  MEDICAL HISTORY: Past Medical History  Diagnosis Date  . Malnutrition ([place]) 09/28/2015  . Needs smoking cessation education 09/28/2015  . Encounter for antineoplastic chemotherapy 10/17/2015    ALLERGIES:  has No Known Allergies.  MEDICATIONS:  Current Outpatient Prescriptions  Medication Sig Dispense Refill  . benzonatate (TESSALON) 100 MG capsule Take 1 capsule (100 mg total) by mouth 3 (three) times daily as needed for cough. 20 capsule 0  . hyaluronate sodium (RADIAPLEXRX) GEL Apply 1 application topically once.    Marland Kitchen ibuprofen (ADVIL,MOTRIN) 200 MG tablet Take 200 mg by mouth every 6 (six) hours as needed.    . naproxen sodium (ANAPROX) 220 MG tablet Take 220 mg by mouth 2 (two) times daily with a meal.    . prochlorperazine (COMPAZINE) 10 MG tablet Take 1 tablet (10 mg total) by mouth every 6 (six) hours as  needed for nausea or vomiting. 30 tablet 0  . Pseudoeph-Doxylamine-DM-APAP (NYQUIL PO) Take by mouth. Reported on 11/22/2015    . sucralfate (CARAFATE) 1 GM/10ML suspension Take 10 mLs (1 g total) by mouth 4 (four) times daily -  with meals and at bedtime. 420 mL 0   No current facility-administered medications for this visit.    SURGICAL HISTORY:  Past Surgical History  Procedure Laterality Date  . Video bronchoscopy Bilateral 09/09/2015    Diagnosis    REVIEW OF SYSTEMS:  A comprehensive review of systems was negative except for: Constitutional: positive for fatigue and weight loss Gastrointestinal: positive for odynophagia   PHYSICAL EXAMINATION: General appearance: alert, cooperative, fatigued and no distress Head: Normocephalic, without obvious abnormality, atraumatic Neck: no adenopathy, no JVD, supple, symmetrical, trachea midline and thyroid not enlarged, symmetric, no tenderness/mass/nodules Lymph nodes: Cervical, supraclavicular, and axillary nodes normal. Resp: diminished breath sounds LUL and wheezes bilaterally Back: symmetric, no curvature. ROM normal. No CVA tenderness. Cardio: regular rate and rhythm, S1, S2 normal, no murmur, click, rub or gallop GI: soft, non-tender; bowel sounds normal; no masses,  no organomegaly Extremities: extremities normal, atraumatic, no cyanosis or edema Neurologic: Alert and oriented X 3, normal strength and tone. Normal symmetric reflexes. Normal coordination and gait  ECOG PERFORMANCE STATUS: 1 - Symptomatic but completely ambulatory  Blood pressure 113/78, pulse 70, temperature 98.3 F (36.8 C), temperature source Oral, resp. rate 18, height '6\' 1"'$  (1.854 m), weight 134 lb 14.4 oz (61.19 kg), SpO2 100 %.  LABORATORY DATA: Lab Results  Component Value Date   WBC 2.3* 11/29/2015   HGB 11.8* 11/29/2015  HCT 36.6* 11/29/2015   MCV 91.3 11/29/2015   PLT 154 11/29/2015      Chemistry      Component Value Date/Time   NA 139  11/29/2015 0942   NA 134* 10/24/2015 1206   K 3.8 11/29/2015 0942   K 4.0 10/24/2015 1206   CL 100* 10/24/2015 1206   CO2 29 11/29/2015 0942   CO2 22 10/24/2015 1206   BUN 11.2 11/29/2015 0942   BUN 16 10/24/2015 1206   CREATININE 0.8 11/29/2015 0942   CREATININE 1.04 10/24/2015 1206      Component Value Date/Time   CALCIUM 8.7 11/29/2015 0942   CALCIUM 9.2 10/24/2015 1206   ALKPHOS 79 11/29/2015 0942   ALKPHOS 100 09/09/2015 0403   AST 17 11/29/2015 0942   AST 29 09/09/2015 0403   ALT 19 11/29/2015 0942   ALT 22 09/09/2015 0403   BILITOT <0.30 11/29/2015 0942   BILITOT 0.5 09/09/2015 0403       RADIOGRAPHIC STUDIES: No results found.  ASSESSMENT AND PLAN: This is a very pleasant 55 years old African-American male with questionable as stage IIIa non-small cell lung cancer, squamous cell carcinoma with large obstructing left upper lobe lung mass and questionable mediastinal lymphadenopathy diagnosed in February 2017. The patient is currently undergoing concurrent chemoradiation with weekly carboplatin and paclitaxel status post 5 weeks of concurrent chemoradiation. He completed radiotherapy on 11/25/2015.  I would discontinue his chemotherapy at this point. I will see the patient back for follow-up visit in 6 weeks for evaluation after repeating CT scan of the chest for restaging of his disease. For smoke cessation, I strongly encouraged the patient to quit smoking and offered him a smoke cessation program. He was advised to call immediately if he has any concerning symptoms in the interval. The patient voices understanding of current disease status and treatment options and is in agreement with the current care plan.  All questions were answered. The patient knows to call the clinic with any problems, questions or concerns. We can certainly see the patient much sooner if necessary.  Disclaimer: This note was dictated with voice recognition software. Similar sounding words can  inadvertently be transcribed and may not be corrected upon review.

## 2015-12-20 NOTE — Progress Notes (Signed)
  Radiation Oncology         [phone] ________________________________  Name: Maurice Mcknight MRN: [id number]  Date: 11/25/2015  DOB: [birth year]  End of Treatment Note   ICD-9-CM ICD-10-CM    1. Malignant neoplasm of upper lobe of left lung (HCC) 162.3 C34.12     DIAGNOSIS: stage IIIA (T2a, N2, M0) non-small cell lung cancer, squamous cell carcinoma diagnosed in February 2017 and presented with left upper lobe obstructing mass      Indication for treatment: Curative along with radiosensitizing chemotherapy  Radiation treatment dates:  10/17/2015-11/25/2015  Site/dose: 60 Gy in 30 fractions to the left chest  Beams/energy: 3D-Conformal Other/ 15X, 6X Photon  Narrative: The patient tolerated radiation treatment relatively well. The patient's chief complaints were a cough and pain and difficulty swallowing.   Plan: The patient has completed radiation treatment. The patient will return to radiation oncology clinic for routine followup in one month. I advised them to call or return sooner if they have any questions or concerns related to their recovery or treatment.  -----------------------------------  [name], PhD, MD  This document serves as a record of services personally performed by Gery Pray, MD. It was created on his behalf by Darcus Austin, a trained medical scribe. The creation of this record is based on the scribe's personal observations and the provider's statements to them. This document has been checked and approved by the attending provider.

## 2016-01-02 ENCOUNTER — Encounter: Payer: Self-pay | Admitting: Oncology

## 2016-01-04 ENCOUNTER — Other Ambulatory Visit (HOSPITAL_BASED_OUTPATIENT_CLINIC_OR_DEPARTMENT_OTHER): Payer: BLUE CROSS/BLUE SHIELD

## 2016-01-04 DIAGNOSIS — C3412 Malignant neoplasm of upper lobe, left bronchus or lung: Secondary | ICD-10-CM | POA: Diagnosis not present

## 2016-01-04 LAB — COMPREHENSIVE METABOLIC PANEL
ALBUMIN: 3.2 g/dL — AB (ref 3.5–5.0)
ALK PHOS: 80 U/L (ref 40–150)
ALT: 11 U/L (ref 0–55)
AST: 13 U/L (ref 5–34)
Anion Gap: 6 mEq/L (ref 3–11)
BUN: 10.1 mg/dL (ref 7.0–26.0)
CO2: 26 mEq/L (ref 22–29)
Calcium: 8.5 mg/dL (ref 8.4–10.4)
Chloride: 108 mEq/L (ref 98–109)
Creatinine: 0.9 mg/dL (ref 0.7–1.3)
EGFR: >90 mL/min/{1.73_m2} (ref >90)
Glucose: 92 mg/dl (ref 70–140)
POTASSIUM: 4 meq/L (ref 3.5–5.1)
Sodium: 140 mEq/L (ref 136–145)
TOTAL PROTEIN: 7.1 g/dL (ref 6.4–8.3)

## 2016-01-04 LAB — CBC WITH DIFFERENTIAL/PLATELET
BASO%: 0.4 % (ref 0.0–2.0)
BASOS ABS: 0 10*3/uL (ref 0.0–0.1)
EOS%: 5.1 % (ref 0.0–7.0)
Eosinophils Absolute: 0.1 10*3/uL (ref 0.0–0.5)
HCT: 33.1 % — ABNORMAL LOW (ref 38.4–49.9)
HEMOGLOBIN: 11 g/dL — AB (ref 13.0–17.1)
LYMPH#: 1.1 10*3/uL (ref 0.9–3.3)
LYMPH%: 43.7 % (ref 14.0–49.0)
MCH: 31.1 pg (ref 27.2–33.4)
MCHC: 33.2 g/dL (ref 32.0–36.0)
MCV: 93.5 fL (ref 79.3–98.0)
MONO#: 0.4 10*3/uL (ref 0.1–0.9)
MONO%: 16.1 % — AB (ref 0.0–14.0)
NEUT#: 0.9 10*3/uL — ABNORMAL LOW (ref 1.5–6.5)
NEUT%: 34.7 % — AB (ref 39.0–75.0)
Platelets: 171 10*3/uL (ref 140–400)
RBC: 3.54 10*6/uL — ABNORMAL LOW (ref 4.20–5.82)
RDW: 16 % — AB (ref 11.0–14.6)
WBC: 2.5 10*3/uL — ABNORMAL LOW (ref 4.0–10.3)

## 2016-01-05 ENCOUNTER — Ambulatory Visit
Admission: RE | Admit: 2016-01-05 | Discharge: 2016-01-05 | Disposition: A | Discharge status: Home / Self Care | Payer: BLUE CROSS/BLUE SHIELD | Source: Ambulatory Visit | Attending: Radiation Oncology | Admitting: Radiation Oncology

## 2016-01-05 ENCOUNTER — Encounter: Payer: Self-pay | Admitting: Radiation Oncology

## 2016-01-05 VITALS — BP 134/94 | HR 64 | Temp 97.9°F | Ht 73.0 in | Wt 141.0 lb

## 2016-01-05 DIAGNOSIS — Z923 Personal history of irradiation: Secondary | ICD-10-CM | POA: Insufficient documentation

## 2016-01-05 DIAGNOSIS — Z79899 Other long term (current) drug therapy: Secondary | ICD-10-CM | POA: Diagnosis not present

## 2016-01-05 DIAGNOSIS — C3412 Malignant neoplasm of upper lobe, left bronchus or lung: Secondary | ICD-10-CM | POA: Insufficient documentation

## 2016-01-05 NOTE — Addendum Note (Signed)
Encounter addended by: Jacqulyn Liner, RN on: 01/05/2016  1:04 PM<BR>     Documentation filed: Charges VN

## 2016-01-05 NOTE — Progress Notes (Signed)
Maurice Mcknight here for follow up.  He denies having pain.  He does report having burning in the center of his chest when eating.  He said it is better.  Advised him to take Carafate as needed.  He denies feeling short of breath.  He does have a dry cough.  He denies having hemoptysis.  He reports having a good energy level.  BP 134/94 mmHg  Pulse 64  Temp(Src) 97.9 F (36.6 C) (Oral)  Ht '6\' 1"'$  (1.854 m)  Wt 141 lb (63.957 kg)  BMI 18.61 kg/m2  SpO2 100%   Wt Readings from Last 3 Encounters:  01/05/16 141 lb (63.957 kg)  11/29/15 134 lb 14.4 oz (61.19 kg)  11/22/15 137 lb 1.6 oz (62.188 kg)

## 2016-01-05 NOTE — Progress Notes (Signed)
Radiation Oncology         [phone] ________________________________  Name: Maurice Mcknight MRN: [id number]  Date: 01/05/2016  DOB: 1961/03/11  Follow-Up Visit Note  CC: No PCP Per Patient  Curt Bears, MD    ICD-9-CM ICD-10-CM   1. Malignant neoplasm of upper lobe of left lung ([facility]) 162.3 C34.12     Diagnosis: Stage IIIA (T2a, N2, M0) non-small cell lung cancer, squamous cell carcinoma diagnosed in February 2017 and presented with left upper lobe obstructing mass   Interval Since Last Radiation: 5 weeks  10/17/2015-11/25/2015: 60 Gy in 30 fractions to the left chest  Narrative:  The patient returns today for routine follow-up. He denies having pain. He does report having burning in the center of his chest while eating. He said it is better. The nurse advised him to take Carafate as needed. He denies feeling short of breath. He does have a dry cough. He denies having hemoptysis. He reports having a good energy level.  ALLERGIES:  has No Known Allergies.  Meds: Current Outpatient Prescriptions  Medication Sig Dispense Refill  . naproxen sodium (ANAPROX) 220 MG tablet Take 220 mg by mouth 2 (two) times daily with a meal.    . benzonatate (TESSALON) 100 MG capsule Take 1 capsule (100 mg total) by mouth 3 (three) times daily as needed for cough. (Patient not taking: Reported on 01/05/2016) 20 capsule 0  . hyaluronate sodium (RADIAPLEXRX) GEL Apply 1 application topically once. Reported on 01/05/2016    . ibuprofen (ADVIL,MOTRIN) 200 MG tablet Take 200 mg by mouth every 6 (six) hours as needed. Reported on 01/05/2016    . prochlorperazine (COMPAZINE) 10 MG tablet Take 1 tablet (10 mg total) by mouth every 6 (six) hours as needed for nausea or vomiting. (Patient not taking: Reported on 01/05/2016) 30 tablet 0  . Pseudoeph-Doxylamine-DM-APAP (NYQUIL PO) Take by mouth. Reported on 01/05/2016    . sucralfate (CARAFATE) 1 GM/10ML suspension Take 10 mLs (1 g total) by mouth 4 (four) times  daily -  with meals and at bedtime. (Patient not taking: Reported on 01/05/2016) 420 mL 0   No current facility-administered medications for this encounter.    Physical Findings: The patient is in no acute distress. Patient is alert and oriented.  height is '6\' 1"'$  (1.854 m) and weight is 141 lb (63.957 kg). His oral temperature is 97.9 F (36.6 C). His blood pressure is 134/94 and his pulse is 64. His oxygen saturation is 100%. . Lungs are clear to auscultation bilaterally. Heart has regular rate and rhythm. No palpable cervical, supraclavicular, or axillary adenopathy. Abdomen soft, non-tender, normal bowel sounds.  Lab Findings: Lab Results  Component Value Date   WBC 2.5* 01/04/2016   HGB 11.0* 01/04/2016   HCT 33.1* 01/04/2016   MCV 93.5 01/04/2016   PLT 171 01/04/2016    Radiographic Findings: No results found.  Impression:  The patient is recovering from the effects of radiation.  Plan: The patient is scheduled for a CT of the chest on 01/09/16. I advised the patient to continue using Carafate as needed. The patient will follow up with me on a PRN basis and he should continue being followed by medical oncology.  ____________________________________ -----------------------------------  [name], PhD, MD  This document serves as a record of services personally performed by Gery Pray, MD. It was created on his behalf by Darcus Austin, a trained medical scribe. The creation of this record is based on the scribe's personal observations  and the provider's statements to them. This document has been checked and approved by the attending provider.

## 2016-01-09 ENCOUNTER — Ambulatory Visit (HOSPITAL_COMMUNITY)
Admission: RE | Admit: 2016-01-09 | Discharge: 2016-01-09 | Disposition: A | Discharge status: Home / Self Care | Payer: BLUE CROSS/BLUE SHIELD | Source: Ambulatory Visit | Attending: Internal Medicine | Admitting: Internal Medicine

## 2016-01-09 ENCOUNTER — Encounter (HOSPITAL_COMMUNITY): Payer: Self-pay

## 2016-01-09 DIAGNOSIS — Z5111 Encounter for antineoplastic chemotherapy: Secondary | ICD-10-CM

## 2016-01-09 DIAGNOSIS — Z923 Personal history of irradiation: Secondary | ICD-10-CM | POA: Insufficient documentation

## 2016-01-09 DIAGNOSIS — C3412 Malignant neoplasm of upper lobe, left bronchus or lung: Secondary | ICD-10-CM | POA: Diagnosis present

## 2016-01-09 MED ORDER — IOPAMIDOL (ISOVUE-300) INJECTION 61%
75.0000 mL | Freq: Once | INTRAVENOUS | Status: AC | PRN
  Administered 2016-01-09: 75 mL via INTRAVENOUS

## 2016-01-11 ENCOUNTER — Telehealth: Payer: Self-pay | Admitting: Medical Oncology

## 2016-01-11 ENCOUNTER — Telehealth: Payer: Self-pay | Admitting: Internal Medicine

## 2016-01-11 ENCOUNTER — Ambulatory Visit: Payer: BLUE CROSS/BLUE SHIELD | Admitting: Internal Medicine

## 2016-01-11 NOTE — Telephone Encounter (Signed)
I left a message on pt phone to return call about missed appt.

## 2016-01-11 NOTE — Telephone Encounter (Signed)
pt called to r/s missed appt....pt ok and aware of new d.t

## 2016-01-16 ENCOUNTER — Telehealth: Payer: Self-pay | Admitting: Internal Medicine

## 2016-01-16 ENCOUNTER — Encounter: Payer: Self-pay | Admitting: Internal Medicine

## 2016-01-16 ENCOUNTER — Ambulatory Visit (HOSPITAL_BASED_OUTPATIENT_CLINIC_OR_DEPARTMENT_OTHER): Payer: BLUE CROSS/BLUE SHIELD | Admitting: Internal Medicine

## 2016-01-16 ENCOUNTER — Encounter: Payer: Self-pay | Admitting: *Deleted

## 2016-01-16 VITALS — BP 95/72 | HR 88 | Temp 98.6°F | Resp 18 | Ht 73.0 in | Wt 134.0 lb

## 2016-01-16 DIAGNOSIS — C3412 Malignant neoplasm of upper lobe, left bronchus or lung: Secondary | ICD-10-CM

## 2016-01-16 DIAGNOSIS — C3492 Malignant neoplasm of unspecified part of left bronchus or lung: Secondary | ICD-10-CM

## 2016-01-16 DIAGNOSIS — Z5111 Encounter for antineoplastic chemotherapy: Secondary | ICD-10-CM

## 2016-01-16 NOTE — Progress Notes (Signed)
Oncology Nurse Navigator Documentation  Oncology Nurse Navigator Flowsheets 01/16/2016  Navigator Location [facility]  Treatment Phase Treatment  Barriers/Navigation Needs No barriers at this time  Interventions None required  Acuity Level 1  Acuity Level 1 Minimal follow up required  Time Spent with Patient 15   I spoke with Maurice Mcknight today.  He is doing well.  No barriers identified at this time.

## 2016-01-16 NOTE — Telephone Encounter (Signed)
Gave and printed appt sched and avs for pt for July and Aug °

## 2016-01-16 NOTE — Progress Notes (Signed)
[place] [phone]   [phone]) [phone]  OFFICE PROGRESS NOTE  No PCP Per Patient No address on file  DIAGNOSIS: Questionable stage IIIA (T2a, N2, M0) non-small cell lung cancer, squamous cell carcinoma diagnosed in February 2017 and presented with left upper lobe obstructing mass as well as questionable periaortic lymphadenopathy.  PRIOR THERAPY: Concurrent chemoradiation with weekly carboplatin for AUC of 2 and paclitaxel 45 MG/M2. First treatment was given on 10/10/2015. Status post 5 weeks of treatment. Last dose was given 11/14/2015 with partial response.  CURRENT THERAPY: Consolidation systemic chemotherapy with carboplatin for AUC of 5 and paclitaxel 175 MG/M2 every 3 weeks with Neulasta support. First dose 01/25/2016.  INTERVAL HISTORY: Maurice Mcknight 55 y.o. male returns to the clinic today for follow-up visit. The patient is doing fine today with no specific complaints. He completed a course of concurrent chemoradiation with weekly carboplatin and paclitaxel. He tolerated his treatment fairly well with no significant adverse effects except for the sore throat and odynophagia. He denied having any significant chest pain, shortness of breath, cough or hemoptysis. He denied having any significant fever or chills. He had repeat CT scan of the chest performed recently and he is here for evaluation and discussion of his scan results.  MEDICAL HISTORY: Past Medical History  Diagnosis Date  . Malnutrition ([place]) 09/28/2015  . Needs smoking cessation education 09/28/2015  . Encounter for antineoplastic chemotherapy 10/17/2015  . Radiation 10/17/15-11/25/15    left chest 60 Gy    ALLERGIES:  has No Known Allergies.  MEDICATIONS:  Current Outpatient Prescriptions  Medication Sig Dispense Refill  . ibuprofen (ADVIL,MOTRIN) 200 MG tablet Take 200 mg by mouth every 6 (six) hours as needed. Reported on 01/05/2016    . naproxen sodium (ANAPROX) 220 MG tablet  Take 220 mg by mouth 2 (two) times daily with a meal.    . hyaluronate sodium (RADIAPLEXRX) GEL Apply 1 application topically once. Reported on 01/16/2016    . prochlorperazine (COMPAZINE) 10 MG tablet Take 1 tablet (10 mg total) by mouth every 6 (six) hours as needed for nausea or vomiting. (Patient not taking: Reported on 01/05/2016) 30 tablet 0  . Pseudoeph-Doxylamine-DM-APAP (NYQUIL PO) Take by mouth. Reported on 01/16/2016    . sucralfate (CARAFATE) 1 GM/10ML suspension Take 10 mLs (1 g total) by mouth 4 (four) times daily -  with meals and at bedtime. (Patient not taking: Reported on 01/05/2016) 420 mL 0   No current facility-administered medications for this visit.    SURGICAL HISTORY:  Past Surgical History  Procedure Laterality Date  . Video bronchoscopy Bilateral 09/09/2015    Diagnosis    REVIEW OF SYSTEMS:  Constitutional: negative Eyes: negative Ears, nose, mouth, throat, and face: negative Respiratory: negative Cardiovascular: negative Gastrointestinal: positive for odynophagia Genitourinary:negative Integument/breast: negative Hematologic/lymphatic: negative Musculoskeletal:negative Neurological: negative Behavioral/Psych: negative Endocrine: negative Allergic/Immunologic: negative   PHYSICAL EXAMINATION: General appearance: alert, cooperative, fatigued and no distress Head: Normocephalic, without obvious abnormality, atraumatic Neck: no adenopathy, no JVD, supple, symmetrical, trachea midline and thyroid not enlarged, symmetric, no tenderness/mass/nodules Lymph nodes: Cervical, supraclavicular, and axillary nodes normal. Resp: diminished breath sounds LUL and wheezes bilaterally Back: symmetric, no curvature. ROM normal. No CVA tenderness. Cardio: regular rate and rhythm, S1, S2 normal, no murmur, click, rub or gallop GI: soft, non-tender; bowel sounds normal; no masses,  no organomegaly Extremities: extremities normal, atraumatic, no cyanosis or edema Neurologic:  Alert and oriented X 3, normal strength and tone. Normal  symmetric reflexes. Normal coordination and gait  ECOG PERFORMANCE STATUS: 1 - Symptomatic but completely ambulatory  Blood pressure 95/72, pulse 88, temperature 98.6 F (37 C), temperature source Oral, resp. rate 18, height '6\' 1"'$  (1.854 m), weight 134 lb (60.782 kg), SpO2 100 %.  LABORATORY DATA: Lab Results  Component Value Date   WBC 2.5* 01/04/2016   HGB 11.0* 01/04/2016   HCT 33.1* 01/04/2016   MCV 93.5 01/04/2016   PLT 171 01/04/2016      Chemistry      Component Value Date/Time   NA 140 01/04/2016 0858   NA 134* 10/24/2015 1206   K 4.0 01/04/2016 0858   K 4.0 10/24/2015 1206   CL 100* 10/24/2015 1206   CO2 26 01/04/2016 0858   CO2 22 10/24/2015 1206   BUN 10.1 01/04/2016 0858   BUN 16 10/24/2015 1206   CREATININE 0.9 01/04/2016 0858   CREATININE 1.04 10/24/2015 1206      Component Value Date/Time   CALCIUM 8.5 01/04/2016 0858   CALCIUM 9.2 10/24/2015 1206   ALKPHOS 80 01/04/2016 0858   ALKPHOS 100 09/09/2015 0403   AST 13 01/04/2016 0858   AST 29 09/09/2015 0403   ALT 11 01/04/2016 0858   ALT 22 09/09/2015 0403   BILITOT <0.30 01/04/2016 0858   BILITOT 0.5 09/09/2015 0403       RADIOGRAPHIC STUDIES: Ct Chest W Contrast  01/09/2016  CLINICAL DATA:  Restaging left-sided lung cancer diagnosed 3 months ago. Chemotherapy and radiation therapy completed. EXAM: CT CHEST WITH CONTRAST TECHNIQUE: Multidetector CT imaging of the chest was performed during intravenous contrast administration. CONTRAST:  4m ISOVUE-300 IOPAMIDOL (ISOVUE-300) INJECTION 61% COMPARISON:  Chest CT 10/24/2015.  PET-CT 10/25/2015. 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 No acute vascular findings are demonstrated. The heart size is normal. There is no pericardial effusion. Mediastinum/Nodes: There are no enlarged mediastinal, hilar or axillary lymph nodes. Mild esophageal wall  thickening, likely related to radiation therapy. Lungs/Pleura: There is no pleural effusion. There has been interval near-complete re-expansion of the left upper lobe. There are improving patchy airspace opacities throughout the left lung. Mild left upper lobe bronchial wall thickening is present without residual mass lesion. There is scattered mild bronchiectasis. No suspicious pulmonary nodules. Upper abdomen:  Unremarkable.  There is no adrenal mass. Musculoskeletal/Chest wall: There is no chest wall mass or suspicious osseous finding. IMPRESSION: 1. Interval partial re-expansion of the left upper lobe without apparent residual focal mass lesion. The patchy airspace opacities previously noted in the left lung have also improved. 2. No evidence of metastatic disease or acute findings. 3. Mild esophageal wall thickening attributed to radiation therapy. Electronically Signed   By: WRichardean SaleM.D.   On: 01/09/2016 08:47    ASSESSMENT AND PLAN: This is a very pleasant 55years old African-American male with questionable as stage IIIa non-small cell lung cancer, squamous cell carcinoma with large obstructing left upper lobe lung mass and questionable mediastinal lymphadenopathy diagnosed in February 2017. The patient completed a course of concurrent chemoradiation with weekly carboplatin and paclitaxel status post 5 weeks of concurrent chemoradiation. He completed radiotherapy on 11/25/2015.  The recent CT scan of the chest showed significant improvement of his disease. I discussed the scan results and showed the images to the patient today. I recommended for him to consider consolidation chemotherapy with 3 cycles of carboplatin and paclitaxel. He is expected to start the first dose of this treatment on 01/25/2016. I discussed  with the patient adverse effect of this treatment including but not limited to alopecia, myelosuppression, nausea and vomiting, peripheral neuropathy, liver or renal  dysfunction. He would come back for follow-up visit in one month for reevaluation before starting cycle #2. For smoke cessation, I strongly encouraged the patient to quit smoking and offered him a smoke cessation program. He was advised to call immediately if he has any concerning symptoms in the interval. The patient voices understanding of current disease status and treatment options and is in agreement with the current care plan.  All questions were answered. The patient knows to call the clinic with any problems, questions or concerns. We can certainly see the patient much sooner if necessary.  Disclaimer: This note was dictated with voice recognition software. Similar sounding words can inadvertently be transcribed and may not be corrected upon review.

## 2016-01-16 NOTE — Telephone Encounter (Signed)
Gave and printed appt sched and avs for pt for July thru Aug

## 2016-01-25 ENCOUNTER — Other Ambulatory Visit (HOSPITAL_BASED_OUTPATIENT_CLINIC_OR_DEPARTMENT_OTHER): Payer: BLUE CROSS/BLUE SHIELD

## 2016-01-25 ENCOUNTER — Other Ambulatory Visit: Payer: BLUE CROSS/BLUE SHIELD

## 2016-01-25 ENCOUNTER — Other Ambulatory Visit: Payer: Self-pay | Admitting: Medical Oncology

## 2016-01-25 ENCOUNTER — Ambulatory Visit (HOSPITAL_BASED_OUTPATIENT_CLINIC_OR_DEPARTMENT_OTHER): Payer: BLUE CROSS/BLUE SHIELD

## 2016-01-25 VITALS — BP 101/66 | HR 61 | Temp 98.5°F | Resp 18

## 2016-01-25 DIAGNOSIS — R059 Cough, unspecified: Secondary | ICD-10-CM

## 2016-01-25 DIAGNOSIS — R05 Cough: Secondary | ICD-10-CM

## 2016-01-25 DIAGNOSIS — C3412 Malignant neoplasm of upper lobe, left bronchus or lung: Secondary | ICD-10-CM | POA: Diagnosis not present

## 2016-01-25 DIAGNOSIS — Z5111 Encounter for antineoplastic chemotherapy: Secondary | ICD-10-CM | POA: Diagnosis not present

## 2016-01-25 LAB — CBC WITH DIFFERENTIAL/PLATELET
BASO%: 0.3 % (ref 0.0–2.0)
Basophils Absolute: 0 10*3/uL (ref 0.0–0.1)
EOS ABS: 0.1 10*3/uL (ref 0.0–0.5)
EOS%: 4 % (ref 0.0–7.0)
HCT: 34.8 % — ABNORMAL LOW (ref 38.4–49.9)
HEMOGLOBIN: 11.7 g/dL — AB (ref 13.0–17.1)
LYMPH%: 29.3 % (ref 14.0–49.0)
MCH: 30.6 pg (ref 27.2–33.4)
MCHC: 33.6 g/dL (ref 32.0–36.0)
MCV: 91.1 fL (ref 79.3–98.0)
MONO#: 0.4 10*3/uL (ref 0.1–0.9)
MONO%: 11.4 % (ref 0.0–14.0)
NEUT%: 55 % (ref 39.0–75.0)
NEUTROS ABS: 1.8 10*3/uL (ref 1.5–6.5)
PLATELETS: 207 10*3/uL (ref 140–400)
RBC: 3.82 10*6/uL — ABNORMAL LOW (ref 4.20–5.82)
RDW: 14 % (ref 11.0–14.6)
WBC: 3.2 10*3/uL — AB (ref 4.0–10.3)
lymph#: 1 10*3/uL (ref 0.9–3.3)

## 2016-01-25 LAB — COMPREHENSIVE METABOLIC PANEL
ALBUMIN: 3.1 g/dL — AB (ref 3.5–5.0)
ALK PHOS: 88 U/L (ref 40–150)
ALT: 12 U/L (ref 0–55)
AST: 14 U/L (ref 5–34)
Anion Gap: 7 mEq/L (ref 3–11)
BILIRUBIN TOTAL: 0.31 mg/dL (ref 0.20–1.20)
BUN: 15.7 mg/dL (ref 7.0–26.0)
CO2: 25 mEq/L (ref 22–29)
CREATININE: 0.9 mg/dL (ref 0.7–1.3)
Calcium: 8.5 mg/dL (ref 8.4–10.4)
Chloride: 108 mEq/L (ref 98–109)
GLUCOSE: 87 mg/dL (ref 70–140)
Potassium: 3.8 mEq/L (ref 3.5–5.1)
SODIUM: 140 meq/L (ref 136–145)
Total Protein: 7.5 g/dL (ref 6.4–8.3)

## 2016-01-25 MED ORDER — PACLITAXEL CHEMO INJECTION 300 MG/50ML
175.0000 mg/m2 | Freq: Once | INTRAVENOUS | Status: AC
  Administered 2016-01-25: 312 mg via INTRAVENOUS
  Filled 2016-01-25: qty 52

## 2016-01-25 MED ORDER — FAMOTIDINE IN NACL 20-0.9 MG/50ML-% IV SOLN
INTRAVENOUS | Status: AC
  Filled 2016-01-25: qty 50

## 2016-01-25 MED ORDER — SODIUM CHLORIDE 0.9 % IV SOLN
519.0000 mg | Freq: Once | INTRAVENOUS | Status: AC
  Administered 2016-01-25: 520 mg via INTRAVENOUS
  Filled 2016-01-25: qty 52

## 2016-01-25 MED ORDER — SODIUM CHLORIDE 0.9 % IV SOLN
Freq: Once | INTRAVENOUS | Status: AC
  Administered 2016-01-25: 11:00:00 via INTRAVENOUS

## 2016-01-25 MED ORDER — DIPHENHYDRAMINE HCL 50 MG/ML IJ SOLN
50.0000 mg | Freq: Once | INTRAMUSCULAR | Status: AC
  Administered 2016-01-25: 50 mg via INTRAVENOUS

## 2016-01-25 MED ORDER — PALONOSETRON HCL INJECTION 0.25 MG/5ML
0.2500 mg | Freq: Once | INTRAVENOUS | Status: AC
  Administered 2016-01-25: 0.25 mg via INTRAVENOUS

## 2016-01-25 MED ORDER — GUAIFENESIN-DM 100-10 MG/5ML PO SYRP: 5.0000 mL | ORAL_SOLUTION | ORAL | Status: DC | PRN

## 2016-01-25 MED ORDER — PALONOSETRON HCL INJECTION 0.25 MG/5ML
INTRAVENOUS | Status: AC
  Filled 2016-01-25: qty 5

## 2016-01-25 MED ORDER — DIPHENHYDRAMINE HCL 50 MG/ML IJ SOLN
INTRAMUSCULAR | Status: AC
  Filled 2016-01-25: qty 1

## 2016-01-25 MED ORDER — SODIUM CHLORIDE 0.9 % IV SOLN
20.0000 mg | Freq: Once | INTRAVENOUS | Status: AC
  Administered 2016-01-25: 20 mg via INTRAVENOUS
  Filled 2016-01-25: qty 2

## 2016-01-25 MED ORDER — FAMOTIDINE IN NACL 20-0.9 MG/50ML-% IV SOLN
20.0000 mg | Freq: Once | INTRAVENOUS | Status: AC
  Administered 2016-01-25: 20 mg via INTRAVENOUS

## 2016-01-25 MED FILL — ROBAFEN-DM SYRUP: 100-10 | 2 days supply | Qty: 118 | Fill #0

## 2016-01-25 NOTE — Patient Instructions (Signed)
Carboplatin injection What is this medicine? CARBOPLATIN (KAR boe pla tin) is a chemotherapy drug. It targets fast dividing cells, like cancer cells, and causes these cells to die. This medicine is used to treat ovarian cancer and many other cancers. This medicine may be used for other purposes; ask your health care provider or pharmacist if you have questions. What should I tell my health care provider before I take this medicine? They need to know if you have any of these conditions: -blood disorders -hearing problems -kidney disease -recent or ongoing radiation therapy -an unusual or allergic reaction to carboplatin, cisplatin, other chemotherapy, other medicines, foods, dyes, or preservatives -pregnant or trying to get pregnant -breast-feeding How should I use this medicine? This drug is usually given as an infusion into a vein. It is administered in a hospital or clinic by a specially trained health care professional. Talk to your pediatrician regarding the use of this medicine in children. Special care may be needed. Overdosage: If you think you have taken too much of this medicine contact a poison control center or emergency room at once. NOTE: This medicine is only for you. Do not share this medicine with others. What if I miss a dose? It is important not to miss a dose. Call your doctor or health care professional if you are unable to keep an appointment. What may interact with this medicine? -medicines for seizures -medicines to increase blood counts like filgrastim, pegfilgrastim, sargramostim -some antibiotics like amikacin, gentamicin, neomycin, streptomycin, tobramycin -vaccines Talk to your doctor or health care professional before taking any of these medicines: -acetaminophen -aspirin -ibuprofen -ketoprofen -naproxen This list may not describe all possible interactions. Give your health care provider a list of all the medicines, herbs, non-prescription drugs, or dietary  supplements you use. Also tell them if you smoke, drink alcohol, or use illegal drugs. Some items may interact with your medicine. What should I watch for while using this medicine? Your condition will be monitored carefully while you are receiving this medicine. You will need important blood work done while you are taking this medicine. This drug may make you feel generally unwell. This is not uncommon, as chemotherapy can affect healthy cells as well as cancer cells. Report any side effects. Continue your course of treatment even though you feel ill unless your doctor tells you to stop. In some cases, you may be given additional medicines to help with side effects. Follow all directions for their use. Call your doctor or health care professional for advice if you get a fever, chills or sore throat, or other symptoms of a cold or flu. Do not treat yourself. This drug decreases your body's ability to fight infections. Try to avoid being around people who are sick. This medicine may increase your risk to bruise or bleed. Call your doctor or health care professional if you notice any unusual bleeding. Be careful brushing and flossing your teeth or using a toothpick because you may get an infection or bleed more easily. If you have any dental work done, tell your dentist you are receiving this medicine. Avoid taking products that contain aspirin, acetaminophen, ibuprofen, naproxen, or ketoprofen unless instructed by your doctor. These medicines may hide a fever. Do not become pregnant while taking this medicine. Women should inform their doctor if they wish to become pregnant or think they might be pregnant. There is a potential for serious side effects to an unborn child. Talk to your health care professional or pharmacist for more information.   Do not breast-feed an infant while taking this medicine. What side effects may I notice from receiving this medicine? Side effects that you should report to your  doctor or health care professional as soon as possible: -allergic reactions like skin rash, itching or hives, swelling of the face, lips, or tongue -signs of infection - fever or chills, cough, sore throat, pain or difficulty passing urine -signs of decreased platelets or bleeding - bruising, pinpoint red spots on the skin, black, tarry stools, nosebleeds -signs of decreased red blood cells - unusually weak or tired, fainting spells, lightheadedness -breathing problems -changes in hearing -changes in vision -chest pain -high blood pressure -low blood counts - This drug may decrease the number of white blood cells, red blood cells and platelets. You may be at increased risk for infections and bleeding. -nausea and vomiting -pain, swelling, redness or irritation at the injection site -pain, tingling, numbness in the hands or feet -problems with balance, talking, walking -trouble passing urine or change in the amount of urine Side effects that usually do not require medical attention (report to your doctor or health care professional if they continue or are bothersome): -hair loss -loss of appetite -metallic taste in the mouth or changes in taste This list may not describe all possible side effects. Call your doctor for medical advice about side effects. You may report side effects to FDA at 1-800-FDA-1088. Where should I keep my medicine? This drug is given in a hospital or clinic and will not be stored at home. NOTE: This sheet is a summary. It may not cover all possible information. If you have questions about this medicine, talk to your doctor, pharmacist, or health care provider.    2016, Elsevier/Gold Standard. (2007-10-07 14:38:05) Paclitaxel injection What is this medicine? PACLITAXEL (PAK li TAX el) is a chemotherapy drug. It targets fast dividing cells, like cancer cells, and causes these cells to die. This medicine is used to treat ovarian cancer, breast cancer, and other  cancers. This medicine may be used for other purposes; ask your health care provider or pharmacist if you have questions. What should I tell my health care provider before I take this medicine? They need to know if you have any of these conditions: -blood disorders -irregular heartbeat -infection (especially a virus infection such as chickenpox, cold sores, or herpes) -liver disease -previous or ongoing radiation therapy -an unusual or allergic reaction to paclitaxel, alcohol, polyoxyethylated castor oil, other chemotherapy agents, other medicines, foods, dyes, or preservatives -pregnant or trying to get pregnant -breast-feeding How should I use this medicine? This drug is given as an infusion into a vein. It is administered in a hospital or clinic by a specially trained health care professional. Talk to your pediatrician regarding the use of this medicine in children. Special care may be needed. Overdosage: If you think you have taken too much of this medicine contact a poison control center or emergency room at once. NOTE: This medicine is only for you. Do not share this medicine with others. What if I miss a dose? It is important not to miss your dose. Call your doctor or health care professional if you are unable to keep an appointment. What may interact with this medicine? Do not take this medicine with any of the following medications: -disulfiram -metronidazole This medicine may also interact with the following medications: -cyclosporine -diazepam -ketoconazole -medicines to increase blood counts like filgrastim, pegfilgrastim, sargramostim -other chemotherapy drugs like cisplatin, doxorubicin, epirubicin, etoposide, teniposide, vincristine -quinidine -testosterone -  vaccines -verapamil Talk to your doctor or health care professional before taking any of these medicines: -acetaminophen -aspirin -ibuprofen -ketoprofen -naproxen This list may not describe all possible  interactions. Give your health care provider a list of all the medicines, herbs, non-prescription drugs, or dietary supplements you use. Also tell them if you smoke, drink alcohol, or use illegal drugs. Some items may interact with your medicine. What should I watch for while using this medicine? Your condition will be monitored carefully while you are receiving this medicine. You will need important blood work done while you are taking this medicine. This drug may make you feel generally unwell. This is not uncommon, as chemotherapy can affect healthy cells as well as cancer cells. Report any side effects. Continue your course of treatment even though you feel ill unless your doctor tells you to stop. This medicine can cause serious allergic reactions. To reduce your risk you will need to take other medicine(s) before treatment with this medicine. In some cases, you may be given additional medicines to help with side effects. Follow all directions for their use. Call your doctor or health care professional for advice if you get a fever, chills or sore throat, or other symptoms of a cold or flu. Do not treat yourself. This drug decreases your body's ability to fight infections. Try to avoid being around people who are sick. This medicine may increase your risk to bruise or bleed. Call your doctor or health care professional if you notice any unusual bleeding. Be careful brushing and flossing your teeth or using a toothpick because you may get an infection or bleed more easily. If you have any dental work done, tell your dentist you are receiving this medicine. Avoid taking products that contain aspirin, acetaminophen, ibuprofen, naproxen, or ketoprofen unless instructed by your doctor. These medicines may hide a fever. Do not become pregnant while taking this medicine. Women should inform their doctor if they wish to become pregnant or think they might be pregnant. There is a potential for serious side  effects to an unborn child. Talk to your health care professional or pharmacist for more information. Do not breast-feed an infant while taking this medicine. Men are advised not to father a child while receiving this medicine. This product may contain alcohol. Ask your pharmacist or healthcare provider if this medicine contains alcohol. Be sure to tell all healthcare providers you are taking this medicine. Certain medicines, like metronidazole and disulfiram, can cause an unpleasant reaction when taken with alcohol. The reaction includes flushing, headache, nausea, vomiting, sweating, and increased thirst. The reaction can last from 30 minutes to several hours. What side effects may I notice from receiving this medicine? Side effects that you should report to your doctor or health care professional as soon as possible: -allergic reactions like skin rash, itching or hives, swelling of the face, lips, or tongue -low blood counts - This drug may decrease the number of white blood cells, red blood cells and platelets. You may be at increased risk for infections and bleeding. -signs of infection - fever or chills, cough, sore throat, pain or difficulty passing urine -signs of decreased platelets or bleeding - bruising, pinpoint red spots on the skin, black, tarry stools, nosebleeds -signs of decreased red blood cells - unusually weak or tired, fainting spells, lightheadedness -breathing problems -chest pain -high or low blood pressure -mouth sores -nausea and vomiting -pain, swelling, redness or irritation at the injection site -pain, tingling, numbness in the hands or   feet -slow or irregular heartbeat -swelling of the ankle, feet, hands Side effects that usually do not require medical attention (report to your doctor or health care professional if they continue or are bothersome): -bone pain -complete hair loss including hair on your head, underarms, pubic hair, eyebrows, and eyelashes -changes in  the color of fingernails -diarrhea -loosening of the fingernails -loss of appetite -muscle or joint pain -red flush to skin -sweating This list may not describe all possible side effects. Call your doctor for medical advice about side effects. You may report side effects to FDA at 1-800-FDA-1088. Where should I keep my medicine? This drug is given in a hospital or clinic and will not be stored at home. NOTE: This sheet is a summary. It may not cover all possible information. If you have questions about this medicine, talk to your doctor, pharmacist, or health care provider.    2016, Elsevier/Gold Standard. (2015-02-17 13:02:56)  

## 2016-01-25 NOTE — Progress Notes (Signed)
Patient c/o dry cough, states he starts coughing when he gets cold. Patient preferred to sit outside on balcony for remainder of treatment, where it was warm. Per diane bell rn, OTC cough medication is awaiting patient p/u at [facility]. Patient notified and will p/u when he completes his chemotherapy.

## 2016-01-25 NOTE — Progress Notes (Signed)
Cough-Robitussin DM called to [place]

## 2016-01-27 ENCOUNTER — Ambulatory Visit: Payer: BLUE CROSS/BLUE SHIELD

## 2016-01-27 NOTE — Progress Notes (Signed)
NO SHOW for Injection Appointment, left message with pt's VM

## 2016-02-01 ENCOUNTER — Other Ambulatory Visit (HOSPITAL_BASED_OUTPATIENT_CLINIC_OR_DEPARTMENT_OTHER): Payer: BLUE CROSS/BLUE SHIELD

## 2016-02-01 DIAGNOSIS — C3412 Malignant neoplasm of upper lobe, left bronchus or lung: Secondary | ICD-10-CM | POA: Diagnosis not present

## 2016-02-01 DIAGNOSIS — C3492 Malignant neoplasm of unspecified part of left bronchus or lung: Secondary | ICD-10-CM

## 2016-02-01 LAB — COMPREHENSIVE METABOLIC PANEL
ALK PHOS: 80 U/L (ref 40–150)
ANION GAP: 7 meq/L (ref 3–11)
AST: 14 U/L (ref 5–34)
Albumin: 3.5 g/dL (ref 3.5–5.0)
BILIRUBIN TOTAL: 0.45 mg/dL (ref 0.20–1.20)
BUN: 25.5 mg/dL (ref 7.0–26.0)
CALCIUM: 8.8 mg/dL (ref 8.4–10.4)
CO2: 27 mEq/L (ref 22–29)
CREATININE: 1 mg/dL (ref 0.7–1.3)
Chloride: 100 mEq/L (ref 98–109)
EGFR: >90 mL/min/{1.73_m2} (ref >90)
Glucose: 85 mg/dl (ref 70–140)
Potassium: 4.2 mEq/L (ref 3.5–5.1)
Sodium: 134 mEq/L — ABNORMAL LOW (ref 136–145)
TOTAL PROTEIN: 7.9 g/dL (ref 6.4–8.3)

## 2016-02-01 LAB — CBC WITH DIFFERENTIAL/PLATELET
BASO%: 1 % (ref 0.0–2.0)
Basophils Absolute: 0 10*3/uL (ref 0.0–0.1)
EOS%: 4.1 % (ref 0.0–7.0)
Eosinophils Absolute: 0.1 10*3/uL (ref 0.0–0.5)
HEMATOCRIT: 34.3 % — AB (ref 38.4–49.9)
HGB: 11.3 g/dL — ABNORMAL LOW (ref 13.0–17.1)
LYMPH#: 0.8 10*3/uL — AB (ref 0.9–3.3)
LYMPH%: 34.3 % (ref 14.0–49.0)
MCH: 30.6 pg (ref 27.2–33.4)
MCHC: 33.1 g/dL (ref 32.0–36.0)
MCV: 92.7 fL (ref 79.3–98.0)
MONO#: 0.1 10*3/uL (ref 0.1–0.9)
MONO%: 4.3 % (ref 0.0–14.0)
NEUT%: 56.3 % (ref 39.0–75.0)
NEUTROS ABS: 1.3 10*3/uL — AB (ref 1.5–6.5)
PLATELETS: 204 10*3/uL (ref 140–400)
RBC: 3.7 10*6/uL — ABNORMAL LOW (ref 4.20–5.82)
RDW: 14.3 % (ref 11.0–14.6)
WBC: 2.4 10*3/uL — ABNORMAL LOW (ref 4.0–10.3)

## 2016-02-08 ENCOUNTER — Telehealth: Payer: Self-pay | Admitting: *Deleted

## 2016-02-08 ENCOUNTER — Telehealth: Payer: Self-pay | Admitting: Internal Medicine

## 2016-02-08 ENCOUNTER — Ambulatory Visit (HOSPITAL_BASED_OUTPATIENT_CLINIC_OR_DEPARTMENT_OTHER): Payer: BLUE CROSS/BLUE SHIELD

## 2016-02-08 ENCOUNTER — Other Ambulatory Visit (HOSPITAL_BASED_OUTPATIENT_CLINIC_OR_DEPARTMENT_OTHER): Payer: BLUE CROSS/BLUE SHIELD

## 2016-02-08 ENCOUNTER — Other Ambulatory Visit: Payer: Self-pay | Admitting: *Deleted

## 2016-02-08 VITALS — BP 100/71 | HR 74 | Temp 98.4°F | Resp 20

## 2016-02-08 DIAGNOSIS — C3492 Malignant neoplasm of unspecified part of left bronchus or lung: Secondary | ICD-10-CM

## 2016-02-08 DIAGNOSIS — C3412 Malignant neoplasm of upper lobe, left bronchus or lung: Secondary | ICD-10-CM

## 2016-02-08 DIAGNOSIS — Z5189 Encounter for other specified aftercare: Secondary | ICD-10-CM

## 2016-02-08 LAB — COMPREHENSIVE METABOLIC PANEL
ALBUMIN: 3.4 g/dL — AB (ref 3.5–5.0)
ALK PHOS: 91 U/L (ref 40–150)
ANION GAP: 8 meq/L (ref 3–11)
AST: 13 U/L (ref 5–34)
BUN: 10.9 mg/dL (ref 7.0–26.0)
CALCIUM: 9 mg/dL (ref 8.4–10.4)
CO2: 27 mEq/L (ref 22–29)
Chloride: 103 mEq/L (ref 98–109)
Creatinine: 0.9 mg/dL (ref 0.7–1.3)
EGFR: >90 mL/min/{1.73_m2} (ref >90)
GLUCOSE: 82 mg/dL (ref 70–140)
Potassium: 3.9 mEq/L (ref 3.5–5.1)
Sodium: 138 mEq/L (ref 136–145)
TOTAL PROTEIN: 8.2 g/dL (ref 6.4–8.3)
Total Bilirubin: <0.3 mg/dL (ref 0.20–1.20)

## 2016-02-08 LAB — CBC WITH DIFFERENTIAL/PLATELET
BASO%: 1.1 % (ref 0.0–2.0)
Basophils Absolute: 0 10*3/uL (ref 0.0–0.1)
EOS ABS: 0.1 10*3/uL (ref 0.0–0.5)
EOS%: 4 % (ref 0.0–7.0)
HEMATOCRIT: 34.7 % — AB (ref 38.4–49.9)
HEMOGLOBIN: 11.8 g/dL — AB (ref 13.0–17.1)
LYMPH#: 1 10*3/uL (ref 0.9–3.3)
LYMPH%: 57.6 % — AB (ref 14.0–49.0)
MCH: 30.9 pg (ref 27.2–33.4)
MCHC: 34 g/dL (ref 32.0–36.0)
MCV: 90.8 fL (ref 79.3–98.0)
MONO#: 0.3 10*3/uL (ref 0.1–0.9)
MONO%: 18.1 % — ABNORMAL HIGH (ref 0.0–14.0)
NEUT%: 19.2 % — ABNORMAL LOW (ref 39.0–75.0)
NEUTROS ABS: 0.3 10*3/uL — AB (ref 1.5–6.5)
NRBC: 0 % (ref 0–0)
PLATELETS: 208 10*3/uL (ref 140–400)
RBC: 3.82 10*6/uL — ABNORMAL LOW (ref 4.20–5.82)
RDW: 13.7 % (ref 11.0–14.6)
WBC: 1.8 10*3/uL — AB (ref 4.0–10.3)

## 2016-02-08 MED ORDER — TBO-FILGRASTIM 300 MCG/0.5ML ~~LOC~~ SOSY
300.0000 ug | PREFILLED_SYRINGE | Freq: Once | SUBCUTANEOUS | Status: AC
  Administered 2016-02-08: 300 ug via SUBCUTANEOUS
  Filled 2016-02-08: qty 0.5

## 2016-02-08 NOTE — Patient Instructions (Signed)

## 2016-02-08 NOTE — Telephone Encounter (Signed)
Pt came in to resched inj apt for 7/27.[facility] Apt resched earlier , pt refused new copy of apt

## 2016-02-08 NOTE — Telephone Encounter (Signed)
granix x2 days. Orders entered under supportive care.

## 2016-02-09 ENCOUNTER — Ambulatory Visit: Payer: BLUE CROSS/BLUE SHIELD

## 2016-02-09 ENCOUNTER — Ambulatory Visit (HOSPITAL_BASED_OUTPATIENT_CLINIC_OR_DEPARTMENT_OTHER): Payer: BLUE CROSS/BLUE SHIELD

## 2016-02-09 VITALS — BP 106/71 | HR 75 | Temp 98.3°F | Resp 20

## 2016-02-09 DIAGNOSIS — C3412 Malignant neoplasm of upper lobe, left bronchus or lung: Secondary | ICD-10-CM | POA: Diagnosis not present

## 2016-02-09 DIAGNOSIS — Z5189 Encounter for other specified aftercare: Secondary | ICD-10-CM

## 2016-02-09 MED ORDER — TBO-FILGRASTIM 300 MCG/0.5ML ~~LOC~~ SOSY
300.0000 ug | PREFILLED_SYRINGE | Freq: Once | SUBCUTANEOUS | Status: AC
  Administered 2016-02-09: 300 ug via SUBCUTANEOUS
  Filled 2016-02-09: qty 0.5

## 2016-02-09 NOTE — Patient Instructions (Signed)

## 2016-02-15 ENCOUNTER — Telehealth: Payer: Self-pay | Admitting: Internal Medicine

## 2016-02-15 ENCOUNTER — Ambulatory Visit (HOSPITAL_BASED_OUTPATIENT_CLINIC_OR_DEPARTMENT_OTHER): Payer: BLUE CROSS/BLUE SHIELD

## 2016-02-15 ENCOUNTER — Ambulatory Visit (HOSPITAL_BASED_OUTPATIENT_CLINIC_OR_DEPARTMENT_OTHER): Payer: BLUE CROSS/BLUE SHIELD | Admitting: Internal Medicine

## 2016-02-15 ENCOUNTER — Encounter: Payer: Self-pay | Admitting: Internal Medicine

## 2016-02-15 ENCOUNTER — Other Ambulatory Visit: Payer: Self-pay | Admitting: Medical Oncology

## 2016-02-15 ENCOUNTER — Other Ambulatory Visit (HOSPITAL_BASED_OUTPATIENT_CLINIC_OR_DEPARTMENT_OTHER): Payer: BLUE CROSS/BLUE SHIELD

## 2016-02-15 VITALS — BP 101/57 | HR 89 | Temp 98.3°F | Resp 18 | Ht 73.0 in | Wt 131.5 lb

## 2016-02-15 DIAGNOSIS — D701 Agranulocytosis secondary to cancer chemotherapy: Secondary | ICD-10-CM

## 2016-02-15 DIAGNOSIS — Z5111 Encounter for antineoplastic chemotherapy: Secondary | ICD-10-CM

## 2016-02-15 DIAGNOSIS — R059 Cough, unspecified: Secondary | ICD-10-CM

## 2016-02-15 DIAGNOSIS — R05 Cough: Secondary | ICD-10-CM

## 2016-02-15 DIAGNOSIS — C3412 Malignant neoplasm of upper lobe, left bronchus or lung: Secondary | ICD-10-CM

## 2016-02-15 DIAGNOSIS — T451X5A Adverse effect of antineoplastic and immunosuppressive drugs, initial encounter: Secondary | ICD-10-CM

## 2016-02-15 DIAGNOSIS — Z720 Tobacco use: Secondary | ICD-10-CM | POA: Diagnosis not present

## 2016-02-15 DIAGNOSIS — C3492 Malignant neoplasm of unspecified part of left bronchus or lung: Secondary | ICD-10-CM

## 2016-02-15 LAB — COMPREHENSIVE METABOLIC PANEL
ALT: 11 U/L (ref 0–55)
ANION GAP: 9 meq/L (ref 3–11)
AST: 13 U/L (ref 5–34)
Albumin: 3.2 g/dL — ABNORMAL LOW (ref 3.5–5.0)
Alkaline Phosphatase: 79 U/L (ref 40–150)
BUN: 20.6 mg/dL (ref 7.0–26.0)
CHLORIDE: 103 meq/L (ref 98–109)
CO2: 25 meq/L (ref 22–29)
CREATININE: 0.8 mg/dL (ref 0.7–1.3)
Calcium: 9.1 mg/dL (ref 8.4–10.4)
EGFR: >90 mL/min/{1.73_m2} (ref >90)
Glucose: 89 mg/dl (ref 70–140)
Potassium: 3.9 mEq/L (ref 3.5–5.1)
SODIUM: 136 meq/L (ref 136–145)
Total Bilirubin: 0.43 mg/dL (ref 0.20–1.20)
Total Protein: 8.2 g/dL (ref 6.4–8.3)

## 2016-02-15 LAB — CBC WITH DIFFERENTIAL/PLATELET
BASO%: 0.4 % (ref 0.0–2.0)
Basophils Absolute: 0 10*3/uL (ref 0.0–0.1)
EOS%: 1.5 % (ref 0.0–7.0)
Eosinophils Absolute: 0 10*3/uL (ref 0.0–0.5)
HCT: 33.6 % — ABNORMAL LOW (ref 38.4–49.9)
HGB: 10.9 g/dL — ABNORMAL LOW (ref 13.0–17.1)
LYMPH#: 0.9 10*3/uL (ref 0.9–3.3)
LYMPH%: 26.8 % (ref 14.0–49.0)
MCH: 30.3 pg (ref 27.2–33.4)
MCHC: 32.4 g/dL (ref 32.0–36.0)
MCV: 93.7 fL (ref 79.3–98.0)
MONO#: 0.3 10*3/uL (ref 0.1–0.9)
MONO%: 10.2 % (ref 0.0–14.0)
NEUT#: 2 10*3/uL (ref 1.5–6.5)
NEUT%: 61.1 % (ref 39.0–75.0)
Platelets: 179 10*3/uL (ref 140–400)
RBC: 3.58 10*6/uL — AB (ref 4.20–5.82)
RDW: 13.6 % (ref 11.0–14.6)
WBC: 3.2 10*3/uL — ABNORMAL LOW (ref 4.0–10.3)

## 2016-02-15 MED ORDER — SODIUM CHLORIDE 0.9 % IV SOLN
568.5000 mg | Freq: Once | INTRAVENOUS | Status: AC
  Administered 2016-02-15: 570 mg via INTRAVENOUS
  Filled 2016-02-15: qty 57

## 2016-02-15 MED ORDER — PEGFILGRASTIM 6 MG/0.6ML ~~LOC~~ PSKT
6.0000 mg | PREFILLED_SYRINGE | Freq: Once | SUBCUTANEOUS | Status: AC
  Administered 2016-02-15: 6 mg via SUBCUTANEOUS
  Filled 2016-02-15: qty 0.6

## 2016-02-15 MED ORDER — PALONOSETRON HCL INJECTION 0.25 MG/5ML
0.2500 mg | Freq: Once | INTRAVENOUS | Status: AC
  Administered 2016-02-15: 0.25 mg via INTRAVENOUS

## 2016-02-15 MED ORDER — HEPARIN SOD (PORK) LOCK FLUSH 100 UNIT/ML IV SOLN
500.0000 [IU] | Freq: Once | INTRAVENOUS | Status: DC | PRN
  Filled 2016-02-15: qty 5

## 2016-02-15 MED ORDER — SODIUM CHLORIDE 0.9 % IV SOLN
175.0000 mg/m2 | Freq: Once | INTRAVENOUS | Status: AC
  Administered 2016-02-15: 312 mg via INTRAVENOUS
  Filled 2016-02-15: qty 52

## 2016-02-15 MED ORDER — PALONOSETRON HCL INJECTION 0.25 MG/5ML
INTRAVENOUS | Status: AC
  Filled 2016-02-15: qty 5

## 2016-02-15 MED ORDER — FAMOTIDINE IN NACL 20-0.9 MG/50ML-% IV SOLN
20.0000 mg | Freq: Once | INTRAVENOUS | Status: AC
  Administered 2016-02-15: 20 mg via INTRAVENOUS

## 2016-02-15 MED ORDER — SODIUM CHLORIDE 0.9 % IV SOLN
Freq: Once | INTRAVENOUS | Status: AC
  Administered 2016-02-15: 10:00:00 via INTRAVENOUS

## 2016-02-15 MED ORDER — DIPHENHYDRAMINE HCL 50 MG/ML IJ SOLN
50.0000 mg | Freq: Once | INTRAMUSCULAR | Status: AC
  Administered 2016-02-15: 50 mg via INTRAVENOUS

## 2016-02-15 MED ORDER — DIPHENHYDRAMINE HCL 50 MG/ML IJ SOLN
INTRAMUSCULAR | Status: AC
  Filled 2016-02-15: qty 1

## 2016-02-15 MED ORDER — SODIUM CHLORIDE 0.9% FLUSH
10.0000 mL | INTRAVENOUS | Status: DC | PRN
  Filled 2016-02-15: qty 10

## 2016-02-15 MED ORDER — SODIUM CHLORIDE 0.9 % IV SOLN
20.0000 mg | Freq: Once | INTRAVENOUS | Status: AC
  Administered 2016-02-15: 20 mg via INTRAVENOUS
  Filled 2016-02-15: qty 2

## 2016-02-15 MED ORDER — FAMOTIDINE IN NACL 20-0.9 MG/50ML-% IV SOLN
INTRAVENOUS | Status: AC
  Filled 2016-02-15: qty 50

## 2016-02-15 MED ORDER — GUAIFENESIN-DM 100-10 MG/5ML PO SYRP
5.0000 mL | ORAL_SOLUTION | ORAL | 0 refills | Status: DC | PRN
Start: 2016-02-15 — End: 2017-01-23

## 2016-02-15 MED FILL — ROBAFEN-DM SYRUP: 100-10 | 3 days supply | Qty: 118 | Fill #0

## 2016-02-15 NOTE — Patient Instructions (Addendum)
[facility] Discharge Instructions for Patients Receiving Chemotherapy  Today you received the following chemotherapy agents Taxol/ Carboplatin, Neulasta Onpro  To help prevent nausea and vomiting after your treatment, we encourage you to take your nausea medication    If you develop nausea and vomiting that is not controlled by your nausea medication, call the clinic.   BELOW ARE SYMPTOMS THAT SHOULD BE REPORTED IMMEDIATELY:  *FEVER GREATER THAN 100.5 F  *CHILLS WITH OR WITHOUT FEVER  NAUSEA AND VOMITING THAT IS NOT CONTROLLED WITH YOUR NAUSEA MEDICATION  *UNUSUAL SHORTNESS OF BREATH  *UNUSUAL BRUISING OR BLEEDING  TENDERNESS IN MOUTH AND THROAT WITH OR WITHOUT PRESENCE OF ULCERS  *URINARY PROBLEMS  *BOWEL PROBLEMS  UNUSUAL RASH Items with * indicate a potential emergency and should be followed up as soon as possible.  Feel free to call the clinic you have any questions or concerns. The clinic phone number is [phone].  Please show the [facility] at check-in to the Emergency Department and triage nurse.

## 2016-02-15 NOTE — Telephone Encounter (Signed)
Pt will get updated sched in tx room °

## 2016-02-15 NOTE — Progress Notes (Signed)
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  PRIOR THERAPY: Concurrent chemoradiation with weekly carboplatin for AUC of 2 and paclitaxel 45 MG/M2. First treatment was given on 10/10/2015. Status post 5 weeks of treatment. Last dose was given 11/14/2015 with partial response.  CURRENT THERAPY: Consolidation systemic chemotherapy with carboplatin for AUC of 5 and paclitaxel 175 MG/M2 every 3 weeks with Neulasta support. First dose 01/25/2016. Status post one cycle.  INTERVAL HISTORY: Maurice Mcknight 55 y.o. male returns to the clinic today for follow-up visit. The patient is doing fine today with no specific complaints except for dry cough. He is currently on consolidation chemotherapy with reduced dose carboplatin and paclitaxel is status post 1 cycle. He tolerated the first cycle of his treatment well except for chemotherapy-induced neutropenia and he required 2 doses of Granix last week. He missed his Neulasta injection after the chemotherapy. He denied having any significant chest pain, shortness of breath, cough or hemoptysis. He denied having any significant fever or chills.   MEDICAL HISTORY: Past Medical History:  Diagnosis Date  . Encounter for antineoplastic chemotherapy 10/17/2015  . Malnutrition ([place]) 09/28/2015  . Needs smoking cessation education 09/28/2015  . Radiation 10/17/15-11/25/15   left chest 60 Gy    ALLERGIES:  has No Known Allergies.  MEDICATIONS:  Current Outpatient Prescriptions  Medication Sig Dispense Refill  . guaiFENesin-dextromethorphan (ROBITUSSIN DM) 100-10 MG/5ML syrup Take 5 mLs by mouth every 4 (four) hours as needed for cough. 118 mL  0  . hyaluronate sodium (RADIAPLEXRX) GEL Apply 1 application topically once. Reported on 01/16/2016    . ibuprofen (ADVIL,MOTRIN) 200 MG tablet Take 200 mg by mouth every 6 (six) hours as needed. Reported on 01/05/2016    . naproxen sodium (ANAPROX) 220 MG tablet Take 220 mg by mouth 2 (two) times daily with a meal.    . prochlorperazine (COMPAZINE) 10 MG tablet Take 1 tablet (10 mg total) by mouth every 6 (six) hours as needed for nausea or vomiting. (Patient not taking: Reported on 01/05/2016) 30 tablet 0  . Pseudoeph-Doxylamine-DM-APAP (NYQUIL PO) Take by mouth. Reported on 01/16/2016    . sucralfate (CARAFATE) 1 GM/10ML suspension Take 10 mLs (1 g total) by mouth 4 (four) times daily -  with meals and at bedtime. (Patient not taking: Reported on 01/05/2016) 420 mL 0   No current facility-administered medications for this visit.     SURGICAL HISTORY:  Past Surgical History:  Procedure Laterality Date  . VIDEO BRONCHOSCOPY Bilateral 09/09/2015   Diagnosis    REVIEW OF SYSTEMS:  A comprehensive review of systems was negative except for: Constitutional: positive for fatigue and weight loss Respiratory: positive for cough   PHYSICAL EXAMINATION: General appearance: alert, cooperative, fatigued and no distress Head: Normocephalic, without obvious abnormality, atraumatic Neck: no adenopathy, no JVD, supple, symmetrical, trachea midline and thyroid not enlarged, symmetric, no tenderness/mass/nodules Lymph nodes: Cervical, supraclavicular, and axillary nodes normal. Resp: diminished breath sounds LUL and wheezes bilaterally Back: symmetric, no curvature. ROM normal. No CVA tenderness. Cardio: regular rate and rhythm, S1, S2 normal, no murmur, click, rub or gallop GI: soft, non-tender; bowel sounds normal; no masses,  no organomegaly Extremities: extremities normal, atraumatic, no cyanosis  or edema Neurologic: Alert and oriented X 3, normal strength and tone. Normal symmetric reflexes. Normal  coordination and gait  ECOG PERFORMANCE STATUS: 1 - Symptomatic but completely ambulatory  Blood pressure (!) 101/57, pulse 89, temperature 98.3 F (36.8 C), temperature source Oral, resp. rate 18, height '6\' 1"'$  (1.854 m), weight 131 lb 8 oz (59.6 kg), SpO2 99 %.  LABORATORY DATA: Lab Results  Component Value Date   WBC 3.2 (L) 02/15/2016   HGB 10.9 (L) 02/15/2016   HCT 33.6 (L) 02/15/2016   MCV 93.7 02/15/2016   PLT 179 02/15/2016      Chemistry      Component Value Date/Time   NA 138 02/08/2016 1025   K 3.9 02/08/2016 1025   CL 100 (L) 10/24/2015 1206   CO2 27 02/08/2016 1025   BUN 10.9 02/08/2016 1025   CREATININE 0.9 02/08/2016 1025      Component Value Date/Time   CALCIUM 9.0 02/08/2016 1025   ALKPHOS 91 02/08/2016 1025   AST 13 02/08/2016 1025   ALT <9 02/08/2016 1025   BILITOT <0.30 02/08/2016 1025       RADIOGRAPHIC STUDIES: No results found.  ASSESSMENT AND PLAN: This is a very pleasant 55 years old African-American male with questionable as stage IIIa non-small cell lung cancer, squamous cell carcinoma with large obstructing left upper lobe lung mass and questionable mediastinal lymphadenopathy diagnosed in February 2017. The patient completed a course of concurrent chemoradiation with weekly carboplatin and paclitaxel status post 5 weeks of concurrent chemoradiation. He completed radiotherapy on 11/25/2015.  The recent CT scan of the chest showed significant improvement of his disease. The patient is currently on consolidation chemotherapy with reduced dose carboplatin and paclitaxel status post 1 cycle. He tolerated the first cycle of his treatment well except for the chemotherapy-induced neutropenia. I recommended for the patient to proceed with cycle #2 today as a scheduled. He will receive Onpro after his chemotherapy today. I will see him back for follow-up visit in 3 weeks for evaluation with the start of cycle #3. For smoke cessation, I strongly  encouraged the patient to quit smoking and offered him a smoke cessation program. He was advised to call immediately if he has any concerning symptoms in the interval. The patient voices understanding of current disease status and treatment options and is in agreement with the current care plan.  All questions were answered. The patient knows to call the clinic with any problems, questions or concerns. We can certainly see the patient much sooner if necessary.  Disclaimer: This note was dictated with voice recognition software. Similar sounding words can inadvertently be transcribed and may not be corrected upon review.

## 2016-02-15 NOTE — Patient Instructions (Signed)
Smoking Cessation, Tips for Success If you are ready to quit smoking, congratulations! You have chosen to help yourself be healthier. Cigarettes bring nicotine, tar, carbon monoxide, and other irritants into your body. Your lungs, heart, and blood vessels will be able to work better without these poisons. There are many different ways to quit smoking. Nicotine gum, nicotine patches, a nicotine inhaler, or nicotine nasal spray can help with physical craving. Hypnosis, support groups, and medicines help break the habit of smoking. WHAT THINGS CAN I DO TO MAKE QUITTING EASIER?  Here are some tips to help you quit for good:  Pick a date when you will quit smoking completely. Tell all of your friends and family about your plan to quit on that date.  Do not try to slowly cut down on the number of cigarettes you are smoking. Pick a quit date and quit smoking completely starting on that day.  Throw away all cigarettes.   Clean and remove all ashtrays from your home, work, and car.  On a card, write down your reasons for quitting. Carry the card with you and read it when you get the urge to smoke.  Cleanse your body of nicotine. Drink enough water and fluids to keep your urine clear or pale yellow. Do this after quitting to flush the nicotine from your body.  Learn to predict your moods. Do not let a bad situation be your excuse to have a cigarette. Some situations in your life might tempt you into wanting a cigarette.  Never have "just one" cigarette. It leads to wanting another and another. Remind yourself of your decision to quit.  Change habits associated with smoking. If you smoked while driving or when feeling stressed, try other activities to replace smoking. Stand up when drinking your coffee. Brush your teeth after eating. Sit in a different chair when you read the paper. Avoid alcohol while trying to quit, and try to drink fewer caffeinated beverages. Alcohol and caffeine may urge you to  smoke.  Avoid foods and drinks that can trigger a desire to smoke, such as sugary or spicy foods and alcohol.  Ask people who smoke not to smoke around you.  Have something planned to do right after eating or having a cup of coffee. For example, plan to take a walk or exercise.  Try a relaxation exercise to calm you down and decrease your stress. Remember, you may be tense and nervous for the first 2 weeks after you quit, but this will pass.  Find new activities to keep your hands busy. Play with a pen, coin, or rubber band. Doodle or draw things on paper.  Brush your teeth right after eating. This will help cut down on the craving for the taste of tobacco after meals. You can also try mouthwash.   Use oral substitutes in place of cigarettes. Try using lemon drops, carrots, cinnamon sticks, or chewing gum. Keep them handy so they are available when you have the urge to smoke.  When you have the urge to smoke, try deep breathing.  Designate your home as a nonsmoking area.  If you are a heavy smoker, ask your health care provider about a prescription for nicotine chewing gum. It can ease your withdrawal from nicotine.  Reward yourself. Set aside the cigarette money you save and buy yourself something nice.  Look for support from others. Join a support group or smoking cessation program. Ask someone at home or at work to help you with your plan   to quit smoking.  Always ask yourself, "Do I need this cigarette or is this just a reflex?" Tell yourself, "Today, I choose not to smoke," or "I do not want to smoke." You are reminding yourself of your decision to quit.  Do not replace cigarette smoking with electronic cigarettes (commonly called e-cigarettes). The safety of e-cigarettes is unknown, and some may contain harmful chemicals.  If you relapse, do not give up! Plan ahead and think about what you will do the next time you get the urge to smoke. HOW WILL I FEEL WHEN I QUIT SMOKING? You  may have symptoms of withdrawal because your body is used to nicotine (the addictive substance in cigarettes). You may crave cigarettes, be irritable, feel very hungry, cough often, get headaches, or have difficulty concentrating. The withdrawal symptoms are only temporary. They are strongest when you first quit but will go away within 10-14 days. When withdrawal symptoms occur, stay in control. Think about your reasons for quitting. Remind yourself that these are signs that your body is healing and getting used to being without cigarettes. Remember that withdrawal symptoms are easier to treat than the major diseases that smoking can cause.  Even after the withdrawal is over, expect periodic urges to smoke. However, these cravings are generally short lived and will go away whether you smoke or not. Do not smoke! WHAT RESOURCES ARE AVAILABLE TO HELP ME QUIT SMOKING? Your health care provider can direct you to community resources or hospitals for support, which may include:  Group support.  Education.  Hypnosis.  Therapy.   This information is not intended to replace advice given to you by your health care provider. Make sure you discuss any questions you have with your health care provider.   Document Released: 03/30/2004 Document Revised: 07/23/2014 Document Reviewed: 12/18/2012 Elsevier Interactive Patient Education 2016 [employer]  

## 2016-02-17 ENCOUNTER — Ambulatory Visit: Payer: BLUE CROSS/BLUE SHIELD

## 2016-02-17 NOTE — Progress Notes (Signed)
Injection not given. Patient had ON-PRO Neulasta  Placement the day of chemotherapy

## 2016-02-22 ENCOUNTER — Other Ambulatory Visit (HOSPITAL_BASED_OUTPATIENT_CLINIC_OR_DEPARTMENT_OTHER): Payer: BLUE CROSS/BLUE SHIELD

## 2016-02-22 DIAGNOSIS — C3412 Malignant neoplasm of upper lobe, left bronchus or lung: Secondary | ICD-10-CM | POA: Diagnosis not present

## 2016-02-22 DIAGNOSIS — C3492 Malignant neoplasm of unspecified part of left bronchus or lung: Secondary | ICD-10-CM

## 2016-02-22 LAB — COMPREHENSIVE METABOLIC PANEL
ALK PHOS: 104 U/L (ref 40–150)
ALT: 9 U/L (ref 0–55)
ANION GAP: 7 meq/L (ref 3–11)
AST: 14 U/L (ref 5–34)
Albumin: 3.4 g/dL — ABNORMAL LOW (ref 3.5–5.0)
BUN: 18.7 mg/dL (ref 7.0–26.0)
CALCIUM: 9 mg/dL (ref 8.4–10.4)
CHLORIDE: 102 meq/L (ref 98–109)
CO2: 27 mEq/L (ref 22–29)
Creatinine: 0.8 mg/dL (ref 0.7–1.3)
Glucose: 98 mg/dl (ref 70–140)
POTASSIUM: 3.7 meq/L (ref 3.5–5.1)
Sodium: 137 mEq/L (ref 136–145)
Total Bilirubin: <0.3 mg/dL (ref 0.20–1.20)
Total Protein: 8.1 g/dL (ref 6.4–8.3)

## 2016-02-22 LAB — CBC WITH DIFFERENTIAL/PLATELET
BASO%: 0.7 % (ref 0.0–2.0)
Basophils Absolute: 0 10*3/uL (ref 0.0–0.1)
EOS%: 0.8 % (ref 0.0–7.0)
Eosinophils Absolute: 0 10*3/uL (ref 0.0–0.5)
HEMATOCRIT: 31.2 % — AB (ref 38.4–49.9)
HGB: 10.3 g/dL — ABNORMAL LOW (ref 13.0–17.1)
LYMPH#: 0.6 10*3/uL — AB (ref 0.9–3.3)
LYMPH%: 12.8 % — ABNORMAL LOW (ref 14.0–49.0)
MCH: 30.9 pg (ref 27.2–33.4)
MCHC: 33.2 g/dL (ref 32.0–36.0)
MCV: 93.3 fL (ref 79.3–98.0)
MONO#: 0.5 10*3/uL (ref 0.1–0.9)
MONO%: 9.7 % (ref 0.0–14.0)
NEUT#: 3.5 10*3/uL (ref 1.5–6.5)
NEUT%: 76 % — AB (ref 39.0–75.0)
PLATELETS: 162 10*3/uL (ref 140–400)
RBC: 3.34 10*6/uL — ABNORMAL LOW (ref 4.20–5.82)
RDW: 12.9 % (ref 11.0–14.6)
WBC: 4.6 10*3/uL (ref 4.0–10.3)

## 2016-02-29 ENCOUNTER — Other Ambulatory Visit (HOSPITAL_BASED_OUTPATIENT_CLINIC_OR_DEPARTMENT_OTHER): Payer: BLUE CROSS/BLUE SHIELD

## 2016-02-29 DIAGNOSIS — C3412 Malignant neoplasm of upper lobe, left bronchus or lung: Secondary | ICD-10-CM | POA: Diagnosis not present

## 2016-02-29 DIAGNOSIS — C3492 Malignant neoplasm of unspecified part of left bronchus or lung: Secondary | ICD-10-CM

## 2016-02-29 LAB — COMPREHENSIVE METABOLIC PANEL
ALBUMIN: 3.1 g/dL — AB (ref 3.5–5.0)
ALK PHOS: 88 U/L (ref 40–150)
ALT: 13 U/L (ref 0–55)
AST: 17 U/L (ref 5–34)
Anion Gap: 8 mEq/L (ref 3–11)
BUN: 19.6 mg/dL (ref 7.0–26.0)
CALCIUM: 9.3 mg/dL (ref 8.4–10.4)
CO2: 27 mEq/L (ref 22–29)
Chloride: 99 mEq/L (ref 98–109)
Creatinine: 0.8 mg/dL (ref 0.7–1.3)
Glucose: 87 mg/dl (ref 70–140)
POTASSIUM: 4.3 meq/L (ref 3.5–5.1)
Sodium: 134 mEq/L — ABNORMAL LOW (ref 136–145)
Total Bilirubin: 0.39 mg/dL (ref 0.20–1.20)
Total Protein: 8.3 g/dL (ref 6.4–8.3)

## 2016-02-29 LAB — CBC WITH DIFFERENTIAL/PLATELET
BASO%: 0.3 % (ref 0.0–2.0)
Basophils Absolute: 0 10*3/uL (ref 0.0–0.1)
EOS%: 0.2 % (ref 0.0–7.0)
Eosinophils Absolute: 0 10*3/uL (ref 0.0–0.5)
HEMATOCRIT: 30.8 % — AB (ref 38.4–49.9)
HEMOGLOBIN: 10.2 g/dL — AB (ref 13.0–17.1)
LYMPH#: 0.8 10*3/uL — AB (ref 0.9–3.3)
LYMPH%: 13.1 % — ABNORMAL LOW (ref 14.0–49.0)
MCH: 31 pg (ref 27.2–33.4)
MCHC: 33.1 g/dL (ref 32.0–36.0)
MCV: 93.6 fL (ref 79.3–98.0)
MONO#: 0.6 10*3/uL (ref 0.1–0.9)
MONO%: 9.7 % (ref 0.0–14.0)
NEUT%: 76.7 % — ABNORMAL HIGH (ref 39.0–75.0)
NEUTROS ABS: 4.4 10*3/uL (ref 1.5–6.5)
PLATELETS: 264 10*3/uL (ref 140–400)
RBC: 3.29 10*6/uL — ABNORMAL LOW (ref 4.20–5.82)
RDW: 13.4 % (ref 11.0–14.6)
WBC: 5.8 10*3/uL (ref 4.0–10.3)

## 2016-03-07 ENCOUNTER — Other Ambulatory Visit (HOSPITAL_BASED_OUTPATIENT_CLINIC_OR_DEPARTMENT_OTHER): Payer: BLUE CROSS/BLUE SHIELD

## 2016-03-07 ENCOUNTER — Encounter: Payer: Self-pay | Admitting: Internal Medicine

## 2016-03-07 ENCOUNTER — Ambulatory Visit (HOSPITAL_BASED_OUTPATIENT_CLINIC_OR_DEPARTMENT_OTHER): Payer: BLUE CROSS/BLUE SHIELD

## 2016-03-07 ENCOUNTER — Ambulatory Visit (HOSPITAL_BASED_OUTPATIENT_CLINIC_OR_DEPARTMENT_OTHER): Payer: BLUE CROSS/BLUE SHIELD | Admitting: Internal Medicine

## 2016-03-07 VITALS — BP 95/53 | HR 70 | Temp 98.2°F | Resp 22 | Ht 73.0 in | Wt 131.1 lb

## 2016-03-07 DIAGNOSIS — F17200 Nicotine dependence, unspecified, uncomplicated: Secondary | ICD-10-CM

## 2016-03-07 DIAGNOSIS — Z5111 Encounter for antineoplastic chemotherapy: Secondary | ICD-10-CM | POA: Diagnosis not present

## 2016-03-07 DIAGNOSIS — E46 Unspecified protein-calorie malnutrition: Secondary | ICD-10-CM

## 2016-03-07 DIAGNOSIS — C3412 Malignant neoplasm of upper lobe, left bronchus or lung: Secondary | ICD-10-CM

## 2016-03-07 DIAGNOSIS — Z720 Tobacco use: Secondary | ICD-10-CM

## 2016-03-07 DIAGNOSIS — C3492 Malignant neoplasm of unspecified part of left bronchus or lung: Secondary | ICD-10-CM

## 2016-03-07 LAB — CBC WITH DIFFERENTIAL/PLATELET
BASO%: 0.8 % (ref 0.0–2.0)
Basophils Absolute: 0 10*3/uL (ref 0.0–0.1)
EOS ABS: 0 10*3/uL (ref 0.0–0.5)
EOS%: 1.1 % (ref 0.0–7.0)
HEMATOCRIT: 29.7 % — AB (ref 38.4–49.9)
HGB: 9.7 g/dL — ABNORMAL LOW (ref 13.0–17.1)
LYMPH%: 28.7 % (ref 14.0–49.0)
MCH: 30.5 pg (ref 27.2–33.4)
MCHC: 32.7 g/dL (ref 32.0–36.0)
MCV: 93.4 fL (ref 79.3–98.0)
MONO#: 0.4 10*3/uL (ref 0.1–0.9)
MONO%: 10.4 % (ref 0.0–14.0)
NEUT%: 59 % (ref 39.0–75.0)
NEUTROS ABS: 2.2 10*3/uL (ref 1.5–6.5)
PLATELETS: 296 10*3/uL (ref 140–400)
RBC: 3.18 10*6/uL — ABNORMAL LOW (ref 4.20–5.82)
RDW: 13.4 % (ref 11.0–14.6)
WBC: 3.8 10*3/uL — ABNORMAL LOW (ref 4.0–10.3)
lymph#: 1.1 10*3/uL (ref 0.9–3.3)

## 2016-03-07 LAB — COMPREHENSIVE METABOLIC PANEL
ALBUMIN: 2.8 g/dL — AB (ref 3.5–5.0)
ALT: 9 U/L (ref 0–55)
ANION GAP: 7 meq/L (ref 3–11)
AST: 13 U/L (ref 5–34)
Alkaline Phosphatase: 74 U/L (ref 40–150)
BUN: 17.2 mg/dL (ref 7.0–26.0)
CALCIUM: 8.8 mg/dL (ref 8.4–10.4)
CO2: 25 meq/L (ref 22–29)
CREATININE: 0.8 mg/dL (ref 0.7–1.3)
Chloride: 105 mEq/L (ref 98–109)
EGFR: >90 mL/min/{1.73_m2} (ref >90)
Glucose: 94 mg/dl (ref 70–140)
Potassium: 3.9 mEq/L (ref 3.5–5.1)
Sodium: 137 mEq/L (ref 136–145)
TOTAL PROTEIN: 7.7 g/dL (ref 6.4–8.3)

## 2016-03-07 MED ORDER — SODIUM CHLORIDE 0.9 % IV SOLN
175.0000 mg/m2 | Freq: Once | INTRAVENOUS | Status: AC
  Administered 2016-03-07: 312 mg via INTRAVENOUS
  Filled 2016-03-07: qty 52

## 2016-03-07 MED ORDER — DIPHENHYDRAMINE HCL 50 MG/ML IJ SOLN
INTRAMUSCULAR | Status: AC
  Filled 2016-03-07: qty 1

## 2016-03-07 MED ORDER — SODIUM CHLORIDE 0.9 % IV SOLN
20.0000 mg | Freq: Once | INTRAVENOUS | Status: AC
  Administered 2016-03-07: 20 mg via INTRAVENOUS
  Filled 2016-03-07: qty 2

## 2016-03-07 MED ORDER — SODIUM CHLORIDE 0.9 % IV SOLN
Freq: Once | INTRAVENOUS | Status: AC
  Administered 2016-03-07: 11:00:00 via INTRAVENOUS

## 2016-03-07 MED ORDER — PALONOSETRON HCL INJECTION 0.25 MG/5ML
0.2500 mg | Freq: Once | INTRAVENOUS | Status: AC
  Administered 2016-03-07: 0.25 mg via INTRAVENOUS

## 2016-03-07 MED ORDER — FAMOTIDINE IN NACL 20-0.9 MG/50ML-% IV SOLN
20.0000 mg | Freq: Once | INTRAVENOUS | Status: AC
  Administered 2016-03-07: 20 mg via INTRAVENOUS

## 2016-03-07 MED ORDER — DIPHENHYDRAMINE HCL 50 MG/ML IJ SOLN: 50.0000 mg | Freq: Once | INTRAMUSCULAR | Status: DC

## 2016-03-07 MED ORDER — SODIUM CHLORIDE 0.9 % IV SOLN
568.5000 mg | Freq: Once | INTRAVENOUS | Status: AC
  Administered 2016-03-07: 570 mg via INTRAVENOUS
  Filled 2016-03-07: qty 57

## 2016-03-07 MED ORDER — FAMOTIDINE IN NACL 20-0.9 MG/50ML-% IV SOLN
INTRAVENOUS | Status: AC
  Filled 2016-03-07: qty 50

## 2016-03-07 MED ORDER — PALONOSETRON HCL INJECTION 0.25 MG/5ML
INTRAVENOUS | Status: AC
  Filled 2016-03-07: qty 5

## 2016-03-07 NOTE — Progress Notes (Unsigned)
Patient refusing benadryl premed for taxol. He states he discussed this with [name] and does not want the drug because it makes him extremely sleepy. Rn notified [name], he recommends reducing dose to 25 mg. The patient refuses this as well. Rn informs patient that in the event he has a chemo reaction, benadryl will be among the meds we administer to treat symptoms of an allergic reaction and he verbalizes understanding. [name] notified.

## 2016-03-07 NOTE — Progress Notes (Signed)
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  PRIOR THERAPY: Concurrent chemoradiation with weekly carboplatin for AUC of 2 and paclitaxel 45 MG/M2. First treatment was given on 10/10/2015. Status post 5 weeks of treatment. Last dose was given 11/14/2015 with partial response.  CURRENT THERAPY: Consolidation systemic chemotherapy with carboplatin for AUC of 5 and paclitaxel 175 MG/M2 every 3 weeks with Neulasta support. First dose 01/25/2016. Status post 2 cycles.  INTERVAL HISTORY: Maurice Mcknight 55 y.o. male returns to the clinic today for follow-up visit. The patient is doing fine today with no specific complaints. He is currently on consolidation chemotherapy with reduced dose carboplatin and paclitaxel status post 2 cycles. He tolerated the second cycle of his treatment well except for increasing pain and fatigue after the Neulasta injection. He would like to proceed with cycle #3 but without the Neulasta injection. He denied having any significant chest pain, shortness of breath, cough or hemoptysis. He denied having any significant fever or chills. He is here today to start cycle #3.  MEDICAL HISTORY: Past Medical History:  Diagnosis Date  . Encounter for antineoplastic chemotherapy 10/17/2015  . Malnutrition ([place]) 09/28/2015  . Needs smoking cessation education 09/28/2015  . Radiation 10/17/15-11/25/15   left chest 60 Gy    ALLERGIES:  has No Known Allergies.  MEDICATIONS:  Current Outpatient Prescriptions  Medication Sig Dispense Refill  . guaiFENesin-dextromethorphan (ROBITUSSIN DM) 100-10 MG/5ML syrup Take 5 mLs by mouth every 4 (four) hours as needed for  cough. 118 mL 0  . ibuprofen (ADVIL,MOTRIN) 200 MG tablet Take 200 mg by mouth every 6 (six) hours as needed. Reported on 01/05/2016    . naproxen sodium (ANAPROX) 220 MG tablet Take 220 mg by mouth 2 (two) times daily with a meal.    . prochlorperazine (COMPAZINE) 10 MG tablet Take 1 tablet (10 mg total) by mouth every 6 (six) hours as needed for nausea or vomiting. 30 tablet 0  . Pseudoeph-Doxylamine-DM-APAP (NYQUIL PO) Take by mouth. Reported on 01/16/2016    . sucralfate (CARAFATE) 1 GM/10ML suspension Take 10 mLs (1 g total) by mouth 4 (four) times daily -  with meals and at bedtime. 420 mL 0   No current facility-administered medications for this visit.     SURGICAL HISTORY:  Past Surgical History:  Procedure Laterality Date  . VIDEO BRONCHOSCOPY Bilateral 09/09/2015   Diagnosis    REVIEW OF SYSTEMS:  A comprehensive review of systems was negative.   PHYSICAL EXAMINATION: General appearance: alert, cooperative, fatigued and no distress Head: Normocephalic, without obvious abnormality, atraumatic Neck: no adenopathy, no JVD, supple, symmetrical, trachea midline and thyroid not enlarged, symmetric, no tenderness/mass/nodules Lymph nodes: Cervical, supraclavicular, and axillary nodes normal. Resp: diminished breath sounds LUL and wheezes bilaterally Back: symmetric, no curvature. ROM normal. No CVA tenderness. Cardio: regular rate and rhythm, S1, S2 normal, no murmur, click, rub or gallop GI: soft, non-tender; bowel sounds normal; no masses,  no organomegaly Extremities: extremities normal, atraumatic, no cyanosis or edema Neurologic: Alert and oriented X 3, normal strength and tone. Normal symmetric reflexes. Normal coordination and gait  ECOG PERFORMANCE STATUS: 1 - Symptomatic but completely ambulatory  Blood pressure (!) 95/53, pulse 70, temperature  98.2 F (36.8 C), temperature source Oral, resp. rate (!) 22, height '6\' 1"'$  (1.854 m), weight 131 lb 1.6 oz (59.5 kg), SpO2 100  %.  LABORATORY DATA: Lab Results  Component Value Date   WBC 3.8 (L) 03/07/2016   HGB 9.7 (L) 03/07/2016   HCT 29.7 (L) 03/07/2016   MCV 93.4 03/07/2016   PLT 296 03/07/2016      Chemistry      Component Value Date/Time   NA 137 03/07/2016 0939   K 3.9 03/07/2016 0939   CL 100 (L) 10/24/2015 1206   CO2 25 03/07/2016 0939   BUN 17.2 03/07/2016 0939   CREATININE 0.8 03/07/2016 0939      Component Value Date/Time   CALCIUM 8.8 03/07/2016 0939   ALKPHOS 74 03/07/2016 0939   AST 13 03/07/2016 0939   ALT 9 03/07/2016 0939   BILITOT <0.30 03/07/2016 0939       RADIOGRAPHIC STUDIES: No results found.  ASSESSMENT AND PLAN: This is a very pleasant 55 years old African-American male with questionable as stage IIIa non-small cell lung cancer, squamous cell carcinoma with large obstructing left upper lobe lung mass and questionable mediastinal lymphadenopathy diagnosed in February 2017. The patient completed a course of concurrent chemoradiation with weekly carboplatin and paclitaxel status post 5 weeks of concurrent chemoradiation. He completed radiotherapy on 11/25/2015.  The Restaging CT scan of the chest showed significant improvement of his disease. The patient is currently on consolidation chemotherapy with reduced dose carboplatin and paclitaxel status post 2 cycles. I recommended for the patient to proceed with cycle #3 today as a scheduled. I would discontinue the Neulasta injection but we will continue to monitor his blood work on weekly basis and I'll consider the patient for treatment with Granix if needed for chemotherapy-induced neutropenia. I will see him back for follow-up visit in one month's for evaluation after repeating CT scan of the chest for restaging of his disease. For smoke cessation, I strongly encouraged the patient to quit smoking and offered him a smoke cessation program. He was advised to call immediately if he has any concerning symptoms in the  interval. The patient voices understanding of current disease status and treatment options and is in agreement with the current care plan.  All questions were answered. The patient knows to call the clinic with any problems, questions or concerns. We can certainly see the patient much sooner if necessary.  Disclaimer: This note was dictated with voice recognition software. Similar sounding words can inadvertently be transcribed and may not be corrected upon review.

## 2016-03-07 NOTE — Patient Instructions (Signed)
[facility] Discharge Instructions for Patients Receiving Chemotherapy  Today you received the following chemotherapy agents Taxol, carboplatin To help prevent nausea and vomiting after your treatment, we encourage you to take your nausea medication as prescribed.  If you develop nausea and vomiting that is not controlled by your nausea medication, call the clinic.   BELOW ARE SYMPTOMS THAT SHOULD BE REPORTED IMMEDIATELY:  *FEVER GREATER THAN 100.5 F  *CHILLS WITH OR WITHOUT FEVER  NAUSEA AND VOMITING THAT IS NOT CONTROLLED WITH YOUR NAUSEA MEDICATION  *UNUSUAL SHORTNESS OF BREATH  *UNUSUAL BRUISING OR BLEEDING  TENDERNESS IN MOUTH AND THROAT WITH OR WITHOUT PRESENCE OF ULCERS  *URINARY PROBLEMS  *BOWEL PROBLEMS  UNUSUAL RASH Items with * indicate a potential emergency and should be followed up as soon as possible.  Feel free to call the clinic you have any questions or concerns. The clinic phone number is [phone].  Please show the [place] at check-in to the Emergency Department and triage nurse.

## 2016-03-12 ENCOUNTER — Telehealth: Payer: Self-pay | Admitting: *Deleted

## 2016-03-12 ENCOUNTER — Telehealth: Payer: Self-pay | Admitting: Internal Medicine

## 2016-03-12 NOTE — Telephone Encounter (Signed)
Called patient & L/M to confirm 08/30 appointment, as per 08/28 LOS.

## 2016-03-12 NOTE — Telephone Encounter (Signed)
Reports [facility] call and no message left.  Provided appointment information advised he get a calendar before he leaves.

## 2016-03-14 ENCOUNTER — Telehealth: Payer: Self-pay | Admitting: Internal Medicine

## 2016-03-14 ENCOUNTER — Other Ambulatory Visit (HOSPITAL_BASED_OUTPATIENT_CLINIC_OR_DEPARTMENT_OTHER): Payer: BLUE CROSS/BLUE SHIELD

## 2016-03-14 DIAGNOSIS — C3412 Malignant neoplasm of upper lobe, left bronchus or lung: Secondary | ICD-10-CM | POA: Diagnosis not present

## 2016-03-14 DIAGNOSIS — C3492 Malignant neoplasm of unspecified part of left bronchus or lung: Secondary | ICD-10-CM

## 2016-03-14 LAB — COMPREHENSIVE METABOLIC PANEL
ALBUMIN: 3.2 g/dL — AB (ref 3.5–5.0)
ALK PHOS: 86 U/L (ref 40–150)
ALT: 13 U/L (ref 0–55)
AST: 17 U/L (ref 5–34)
Anion Gap: 9 mEq/L (ref 3–11)
BUN: 16.8 mg/dL (ref 7.0–26.0)
CO2: 26 meq/L (ref 22–29)
Calcium: 9.2 mg/dL (ref 8.4–10.4)
Chloride: 100 mEq/L (ref 98–109)
Creatinine: 0.8 mg/dL (ref 0.7–1.3)
GLUCOSE: 87 mg/dL (ref 70–140)
POTASSIUM: 4.5 meq/L (ref 3.5–5.1)
SODIUM: 135 meq/L — AB (ref 136–145)
Total Bilirubin: <0.3 mg/dL (ref 0.20–1.20)
Total Protein: 8.5 g/dL — ABNORMAL HIGH (ref 6.4–8.3)

## 2016-03-14 LAB — CBC WITH DIFFERENTIAL/PLATELET
BASO%: 1 % (ref 0.0–2.0)
BASOS ABS: 0 10*3/uL (ref 0.0–0.1)
EOS%: 1.2 % (ref 0.0–7.0)
Eosinophils Absolute: 0 10*3/uL (ref 0.0–0.5)
HCT: 33.3 % — ABNORMAL LOW (ref 38.4–49.9)
HEMOGLOBIN: 10.7 g/dL — AB (ref 13.0–17.1)
LYMPH%: 32.5 % (ref 14.0–49.0)
MCH: 30.1 pg (ref 27.2–33.4)
MCHC: 32.1 g/dL (ref 32.0–36.0)
MCV: 94 fL (ref 79.3–98.0)
MONO#: 0.2 10*3/uL (ref 0.1–0.9)
MONO%: 5.1 % (ref 0.0–14.0)
NEUT%: 60.2 % (ref 39.0–75.0)
NEUTROS ABS: 1.9 10*3/uL (ref 1.5–6.5)
Platelets: 313 10*3/uL (ref 140–400)
RBC: 3.54 10*6/uL — AB (ref 4.20–5.82)
RDW: 13.3 % (ref 11.0–14.6)
WBC: 3.2 10*3/uL — AB (ref 4.0–10.3)
lymph#: 1 10*3/uL (ref 0.9–3.3)

## 2016-03-14 NOTE — Telephone Encounter (Signed)
PATIENT STOPPED BY FOR SCHEDULED. PER PATIENT MOVED 9/22 TO 9/20 AND 9/26 TO 9/7. GAVE PATIENT NEW SCHEDULE FOR September.

## 2016-03-21 ENCOUNTER — Other Ambulatory Visit (HOSPITAL_BASED_OUTPATIENT_CLINIC_OR_DEPARTMENT_OTHER): Payer: BLUE CROSS/BLUE SHIELD

## 2016-03-21 DIAGNOSIS — C3412 Malignant neoplasm of upper lobe, left bronchus or lung: Secondary | ICD-10-CM | POA: Diagnosis not present

## 2016-03-21 DIAGNOSIS — C3492 Malignant neoplasm of unspecified part of left bronchus or lung: Secondary | ICD-10-CM

## 2016-03-21 LAB — COMPREHENSIVE METABOLIC PANEL
ALBUMIN: 3 g/dL — AB (ref 3.5–5.0)
ALK PHOS: 82 U/L (ref 40–150)
ALT: 12 U/L (ref 0–55)
ANION GAP: 8 meq/L (ref 3–11)
AST: 14 U/L (ref 5–34)
BUN: 13.4 mg/dL (ref 7.0–26.0)
CALCIUM: 9.3 mg/dL (ref 8.4–10.4)
CHLORIDE: 102 meq/L (ref 98–109)
CO2: 27 mEq/L (ref 22–29)
Creatinine: 0.8 mg/dL (ref 0.7–1.3)
Glucose: 90 mg/dl (ref 70–140)
POTASSIUM: 4.6 meq/L (ref 3.5–5.1)
Sodium: 136 mEq/L (ref 136–145)
Total Bilirubin: 0.32 mg/dL (ref 0.20–1.20)
Total Protein: 8.6 g/dL — ABNORMAL HIGH (ref 6.4–8.3)

## 2016-03-21 LAB — CBC WITH DIFFERENTIAL/PLATELET
BASO%: 0.8 % (ref 0.0–2.0)
BASOS ABS: 0 10*3/uL (ref 0.0–0.1)
EOS ABS: 0 10*3/uL (ref 0.0–0.5)
EOS%: 1.1 % (ref 0.0–7.0)
HEMATOCRIT: 29.8 % — AB (ref 38.4–49.9)
HEMOGLOBIN: 9.8 g/dL — AB (ref 13.0–17.1)
LYMPH#: 0.8 10*3/uL — AB (ref 0.9–3.3)
LYMPH%: 29.6 % (ref 14.0–49.0)
MCH: 30.5 pg (ref 27.2–33.4)
MCHC: 33 g/dL (ref 32.0–36.0)
MCV: 92.6 fL (ref 79.3–98.0)
MONO#: 0.5 10*3/uL (ref 0.1–0.9)
MONO%: 19.4 % — ABNORMAL HIGH (ref 0.0–14.0)
NEUT#: 1.4 10*3/uL — ABNORMAL LOW (ref 1.5–6.5)
NEUT%: 49.1 % (ref 39.0–75.0)
PLATELETS: 283 10*3/uL (ref 140–400)
RBC: 3.22 10*6/uL — ABNORMAL LOW (ref 4.20–5.82)
RDW: 13.8 % (ref 11.0–14.6)
WBC: 2.8 10*3/uL — ABNORMAL LOW (ref 4.0–10.3)

## 2016-03-28 ENCOUNTER — Other Ambulatory Visit (HOSPITAL_BASED_OUTPATIENT_CLINIC_OR_DEPARTMENT_OTHER): Payer: BLUE CROSS/BLUE SHIELD

## 2016-03-28 DIAGNOSIS — C3412 Malignant neoplasm of upper lobe, left bronchus or lung: Secondary | ICD-10-CM

## 2016-03-28 DIAGNOSIS — C3492 Malignant neoplasm of unspecified part of left bronchus or lung: Secondary | ICD-10-CM

## 2016-03-28 LAB — COMPREHENSIVE METABOLIC PANEL
ALT: 10 U/L (ref 0–55)
AST: 12 U/L (ref 5–34)
Albumin: 2.8 g/dL — ABNORMAL LOW (ref 3.5–5.0)
Alkaline Phosphatase: 83 U/L (ref 40–150)
Anion Gap: 7 mEq/L (ref 3–11)
BUN: 13.5 mg/dL (ref 7.0–26.0)
CHLORIDE: 103 meq/L (ref 98–109)
CO2: 27 mEq/L (ref 22–29)
Calcium: 9 mg/dL (ref 8.4–10.4)
Creatinine: 0.8 mg/dL (ref 0.7–1.3)
EGFR: >90 mL/min/{1.73_m2} (ref >90)
GLUCOSE: 86 mg/dL (ref 70–140)
POTASSIUM: 4.4 meq/L (ref 3.5–5.1)
SODIUM: 137 meq/L (ref 136–145)
Total Bilirubin: <0.3 mg/dL (ref 0.20–1.20)
Total Protein: 8.3 g/dL (ref 6.4–8.3)

## 2016-03-28 LAB — CBC WITH DIFFERENTIAL/PLATELET
BASO%: 0.5 % (ref 0.0–2.0)
BASOS ABS: 0 10*3/uL (ref 0.0–0.1)
EOS%: 0.8 % (ref 0.0–7.0)
Eosinophils Absolute: 0 10*3/uL (ref 0.0–0.5)
HCT: 29 % — ABNORMAL LOW (ref 38.4–49.9)
HEMOGLOBIN: 9.6 g/dL — AB (ref 13.0–17.1)
LYMPH%: 23.8 % (ref 14.0–49.0)
MCH: 30.5 pg (ref 27.2–33.4)
MCHC: 33.1 g/dL (ref 32.0–36.0)
MCV: 92.2 fL (ref 79.3–98.0)
MONO#: 0.4 10*3/uL (ref 0.1–0.9)
MONO%: 11.3 % (ref 0.0–14.0)
NEUT#: 2.3 10*3/uL (ref 1.5–6.5)
NEUT%: 63.6 % (ref 39.0–75.0)
Platelets: 188 10*3/uL (ref 140–400)
RBC: 3.14 10*6/uL — ABNORMAL LOW (ref 4.20–5.82)
RDW: 14 % (ref 11.0–14.6)
WBC: 3.6 10*3/uL — ABNORMAL LOW (ref 4.0–10.3)
lymph#: 0.8 10*3/uL — ABNORMAL LOW (ref 0.9–3.3)

## 2016-04-04 ENCOUNTER — Other Ambulatory Visit (HOSPITAL_BASED_OUTPATIENT_CLINIC_OR_DEPARTMENT_OTHER): Payer: BLUE CROSS/BLUE SHIELD

## 2016-04-04 ENCOUNTER — Ambulatory Visit (HOSPITAL_COMMUNITY)
Admission: RE | Admit: 2016-04-04 | Discharge: 2016-04-04 | Disposition: A | Discharge status: Home / Self Care | Payer: BLUE CROSS/BLUE SHIELD | Source: Ambulatory Visit | Attending: Internal Medicine | Admitting: Internal Medicine

## 2016-04-04 ENCOUNTER — Encounter (HOSPITAL_COMMUNITY): Payer: Self-pay

## 2016-04-04 DIAGNOSIS — Z559 Problems related to education and literacy, unspecified: Secondary | ICD-10-CM | POA: Insufficient documentation

## 2016-04-04 DIAGNOSIS — Z5111 Encounter for antineoplastic chemotherapy: Secondary | ICD-10-CM | POA: Insufficient documentation

## 2016-04-04 DIAGNOSIS — C3412 Malignant neoplasm of upper lobe, left bronchus or lung: Secondary | ICD-10-CM | POA: Insufficient documentation

## 2016-04-04 DIAGNOSIS — E46 Unspecified protein-calorie malnutrition: Secondary | ICD-10-CM

## 2016-04-04 DIAGNOSIS — F17200 Nicotine dependence, unspecified, uncomplicated: Secondary | ICD-10-CM

## 2016-04-04 LAB — COMPREHENSIVE METABOLIC PANEL WITH GFR
ALT: 10 U/L (ref 0–55)
AST: 13 U/L (ref 5–34)
Albumin: 2.8 g/dL — ABNORMAL LOW (ref 3.5–5.0)
Alkaline Phosphatase: 85 U/L (ref 40–150)
Anion Gap: 9 meq/L (ref 3–11)
BUN: 24.1 mg/dL (ref 7.0–26.0)
CO2: 25 meq/L (ref 22–29)
Calcium: 9.1 mg/dL (ref 8.4–10.4)
Chloride: 100 meq/L (ref 98–109)
Creatinine: 0.8 mg/dL (ref 0.7–1.3)
EGFR: >90 ml/min/1.73 m2
Glucose: 78 mg/dL (ref 70–140)
Potassium: 4.1 meq/L (ref 3.5–5.1)
Sodium: 134 meq/L — ABNORMAL LOW (ref 136–145)
Total Bilirubin: 0.37 mg/dL (ref 0.20–1.20)
Total Protein: 8.3 g/dL (ref 6.4–8.3)

## 2016-04-04 LAB — CBC WITH DIFFERENTIAL/PLATELET
BASO%: 0.3 % (ref 0.0–2.0)
Basophils Absolute: 0 10e3/uL (ref 0.0–0.1)
EOS%: 0.6 % (ref 0.0–7.0)
Eosinophils Absolute: 0 10e3/uL (ref 0.0–0.5)
HCT: 28.5 % — ABNORMAL LOW (ref 38.4–49.9)
HGB: 9.4 g/dL — ABNORMAL LOW (ref 13.0–17.1)
LYMPH%: 24.9 % (ref 14.0–49.0)
MCH: 30 pg (ref 27.2–33.4)
MCHC: 33 g/dL (ref 32.0–36.0)
MCV: 91.1 fL (ref 79.3–98.0)
MONO#: 0.3 10e3/uL (ref 0.1–0.9)
MONO%: 8 % (ref 0.0–14.0)
NEUT#: 2.4 10e3/uL (ref 1.5–6.5)
NEUT%: 66.2 % (ref 39.0–75.0)
Platelets: 173 10e3/uL (ref 140–400)
RBC: 3.13 10e6/uL — ABNORMAL LOW (ref 4.20–5.82)
RDW: 14.2 % (ref 11.0–14.6)
WBC: 3.6 10e3/uL — ABNORMAL LOW (ref 4.0–10.3)
lymph#: 0.9 10e3/uL (ref 0.9–3.3)

## 2016-04-04 MED ORDER — IOPAMIDOL (ISOVUE-300) INJECTION 61%
75.0000 mL | Freq: Once | INTRAVENOUS | Status: AC | PRN
  Administered 2016-04-04: 75 mL via INTRAVENOUS

## 2016-04-06 ENCOUNTER — Other Ambulatory Visit: Payer: BLUE CROSS/BLUE SHIELD

## 2016-04-09 ENCOUNTER — Emergency Department (HOSPITAL_COMMUNITY)
Admission: EM | Admit: 2016-04-09 | Discharge: 2016-04-09 | Disposition: A | Discharge status: Home / Self Care | Payer: BLUE CROSS/BLUE SHIELD | Attending: Emergency Medicine | Admitting: Emergency Medicine

## 2016-04-09 ENCOUNTER — Emergency Department (HOSPITAL_COMMUNITY): Payer: BLUE CROSS/BLUE SHIELD

## 2016-04-09 ENCOUNTER — Encounter (HOSPITAL_COMMUNITY): Payer: Self-pay | Admitting: *Deleted

## 2016-04-09 ENCOUNTER — Other Ambulatory Visit: Payer: Self-pay

## 2016-04-09 DIAGNOSIS — Y9389 Activity, other specified: Secondary | ICD-10-CM | POA: Insufficient documentation

## 2016-04-09 DIAGNOSIS — Z79899 Other long term (current) drug therapy: Secondary | ICD-10-CM | POA: Insufficient documentation

## 2016-04-09 DIAGNOSIS — Y990 Civilian activity done for income or pay: Secondary | ICD-10-CM | POA: Insufficient documentation

## 2016-04-09 DIAGNOSIS — F17210 Nicotine dependence, cigarettes, uncomplicated: Secondary | ICD-10-CM | POA: Insufficient documentation

## 2016-04-09 DIAGNOSIS — S0093XA Contusion of unspecified part of head, initial encounter: Secondary | ICD-10-CM | POA: Diagnosis not present

## 2016-04-09 DIAGNOSIS — E860 Dehydration: Secondary | ICD-10-CM | POA: Insufficient documentation

## 2016-04-09 DIAGNOSIS — Y929 Unspecified place or not applicable: Secondary | ICD-10-CM | POA: Diagnosis not present

## 2016-04-09 DIAGNOSIS — W07XXXA Fall from chair, initial encounter: Secondary | ICD-10-CM | POA: Diagnosis not present

## 2016-04-09 DIAGNOSIS — S0003XA Contusion of scalp, initial encounter: Secondary | ICD-10-CM | POA: Insufficient documentation

## 2016-04-09 DIAGNOSIS — W19XXXA Unspecified fall, initial encounter: Secondary | ICD-10-CM

## 2016-04-09 DIAGNOSIS — S0990XA Unspecified injury of head, initial encounter: Secondary | ICD-10-CM | POA: Diagnosis present

## 2016-04-09 DIAGNOSIS — Z85118 Personal history of other malignant neoplasm of bronchus and lung: Secondary | ICD-10-CM | POA: Diagnosis not present

## 2016-04-09 HISTORY — DX: Malignant (primary) neoplasm, unspecified: C80.1

## 2016-04-09 LAB — CBC WITH DIFFERENTIAL/PLATELET
BASOS ABS: 0 10*3/uL (ref 0.0–0.1)
BASOS PCT: 0 %
Eosinophils Absolute: 0 10*3/uL (ref 0.0–0.7)
Eosinophils Relative: 0 %
HEMATOCRIT: 26.9 % — AB (ref 39.0–52.0)
HEMOGLOBIN: 9.1 g/dL — AB (ref 13.0–17.0)
LYMPHS PCT: 9 %
Lymphs Abs: 0.4 10*3/uL — ABNORMAL LOW (ref 0.7–4.0)
MCH: 29.9 pg (ref 26.0–34.0)
MCHC: 33.8 g/dL (ref 30.0–36.0)
MCV: 88.5 fL (ref 78.0–100.0)
Monocytes Absolute: 0.3 10*3/uL (ref 0.1–1.0)
Monocytes Relative: 8 %
NEUTROS ABS: 3.4 10*3/uL (ref 1.7–7.7)
NEUTROS PCT: 83 %
Platelets: 275 10*3/uL (ref 150–400)
RBC: 3.04 MIL/uL — AB (ref 4.22–5.81)
RDW: 14.1 % (ref 11.5–15.5)
WBC: 4.1 10*3/uL (ref 4.0–10.5)

## 2016-04-09 LAB — URINALYSIS, ROUTINE W REFLEX MICROSCOPIC
GLUCOSE, UA: NEGATIVE mg/dL
Hgb urine dipstick: NEGATIVE
KETONES UR: NEGATIVE mg/dL
LEUKOCYTES UA: NEGATIVE
NITRITE: NEGATIVE
PH: 5.5 (ref 5.0–8.0)
Protein, ur: NEGATIVE mg/dL
SPECIFIC GRAVITY, URINE: 1.017 (ref 1.005–1.030)

## 2016-04-09 LAB — COMPREHENSIVE METABOLIC PANEL
ALBUMIN: 3.2 g/dL — AB (ref 3.5–5.0)
ALK PHOS: 74 U/L (ref 38–126)
ALT: 15 U/L — ABNORMAL LOW (ref 17–63)
ANION GAP: 9 (ref 5–15)
AST: 19 U/L (ref 15–41)
BILIRUBIN TOTAL: 0.4 mg/dL (ref 0.3–1.2)
BUN: 22 mg/dL — ABNORMAL HIGH (ref 6–20)
CALCIUM: 8.8 mg/dL — AB (ref 8.9–10.3)
CO2: 25 mmol/L (ref 22–32)
Chloride: 99 mmol/L — ABNORMAL LOW (ref 101–111)
Creatinine, Ser: 0.96 mg/dL (ref 0.61–1.24)
GFR calc non Af Amer: >60 mL/min (ref >60)
GLUCOSE: 96 mg/dL (ref 65–99)
POTASSIUM: 3.8 mmol/L (ref 3.5–5.1)
SODIUM: 133 mmol/L — AB (ref 135–145)
TOTAL PROTEIN: 8.6 g/dL — AB (ref 6.5–8.1)

## 2016-04-09 MED ORDER — RANITIDINE HCL 150 MG PO CAPS: 150.0000 mg | ORAL_CAPSULE | Freq: Every day | ORAL | 0 refills | Status: AC

## 2016-04-09 MED ORDER — SODIUM CHLORIDE 0.9 % IV BOLUS (SEPSIS)
1000.0000 mL | Freq: Once | INTRAVENOUS | Status: AC
Start: 2016-04-09 — End: 2016-04-09
  Administered 2016-04-09: 1000 mL via INTRAVENOUS

## 2016-04-09 MED ORDER — SODIUM CHLORIDE 0.9 % IV BOLUS (SEPSIS)
1000.0000 mL | Freq: Once | INTRAVENOUS | Status: AC
  Administered 2016-04-09: 1000 mL via INTRAVENOUS

## 2016-04-09 NOTE — ED Notes (Signed)
PT have been made of urine sample. Urinal in hand

## 2016-04-09 NOTE — ED Provider Notes (Signed)
[place] DEPT Provider Note   CSN: [id number] Arrival date & time: 04/09/16  1211     History   Chief Complaint Chief Complaint  Patient presents with  . Weakness  . Fall    HPI Maurice Mcknight is a 55 y.o. male.  55 year old African-American male past medical history significant for lung cancer currently receiving chemotherapy infusions that presents to the ED today by EMS due to fall. Patient states that he's been feeling weak and dizzy for the past year. Denies any vertigo-like symptoms. States that he gets lightheaded when he stands up too fast. Patient he was at work today and was out in the smoking porch the next thing he remembered he was waking up on the concrete floor. Patient denies any memory of the fall. States that he was able to get himself up into the chair when bystander started coming out and putting cold rags on them. He also endorses having episode of emesis after the event. Patient states that he has not been feeling well for the past few weeks. He endorses decreased appetite recently along with emesis for the past several days. He also complains of right toothache for the past 2 weeks. Patient states that he has had a cough for the past year that has not gone away. He also endorses subjective fevers at home. Denies any headache, vision changes, chest pain, shortness of breath, abdominal pain, urinary symptoms, change in bowel habits, hematochezia, melena, numbness or tingling. Denies any recent medication change. Last chemo infusing was 1 month ago.       Past Medical History:  Diagnosis Date  . Cancer ([place])    Lung Cancer  . Encounter for antineoplastic chemotherapy 10/17/2015  . Malnutrition ([place]) 09/28/2015  . Needs smoking cessation education 09/28/2015  . Radiation 10/17/15-11/25/15   left chest 60 Gy    Patient Active Problem List   Diagnosis Date Noted  . Chemotherapy induced neutropenia ([place]) 02/15/2016  . Near syncope 10/24/2015  . Encounter for  antineoplastic chemotherapy 10/17/2015  . Malnutrition ([place]) 09/28/2015  . Needs smoking cessation education 09/28/2015  . Pneumonia 09/12/2015  . Postobstructive pneumonia   . Malignant neoplasm of upper lobe of left lung [facility]     Past Surgical History:  Procedure Laterality Date  . VIDEO BRONCHOSCOPY Bilateral 09/09/2015   Diagnosis       Home Medications    Prior to Admission medications   Medication Sig Start Date End Date Taking? Authorizing Provider  guaiFENesin-dextromethorphan (ROBITUSSIN DM) 100-10 MG/5ML syrup Take 5 mLs by mouth every 4 (four) hours as needed for cough. 02/15/16  Yes Curt Bears, MD  ibuprofen (ADVIL,MOTRIN) 200 MG tablet Take 800 mg by mouth every 6 (six) hours as needed for moderate pain. Reported on 01/05/2016   Yes Historical Provider, MD  prochlorperazine (COMPAZINE) 10 MG tablet Take 1 tablet (10 mg total) by mouth every 6 (six) hours as needed for nausea or vomiting. 10/05/15  Yes Curt Bears, MD  sucralfate (CARAFATE) 1 GM/10ML suspension Take 10 mLs (1 g total) by mouth 4 (four) times daily -  with meals and at bedtime. Patient not taking: Reported on 04/09/2016 11/14/15   Curt Bears, MD    Family History Family History  Problem Relation Age of Onset  . Cancer Sister     Social History Social History  Substance Use Topics  . Smoking status: Current Some Day Smoker    Packs/day: 0.10    Types: Cigarettes    Start date: 10/05/1985  .  Smokeless tobacco: Never Used  . Alcohol use No     Allergies   Review of patient's allergies indicates no known allergies.   Review of Systems Review of Systems  Constitutional: Positive for fever (subjective). Negative for activity change, appetite change and chills.  HENT: Positive for dental problem. Negative for congestion, ear pain, rhinorrhea and sore throat.   Eyes: Negative for pain and discharge.  Respiratory: Negative for cough and shortness of breath.   Cardiovascular: Negative  for chest pain, palpitations and leg swelling.  Gastrointestinal: Positive for nausea and vomiting. Negative for abdominal pain and diarrhea.  Genitourinary: Negative for dysuria, flank pain, frequency, hematuria and urgency.  Musculoskeletal: Negative for myalgias and neck pain.  Neurological: Positive for dizziness, syncope and light-headedness. Negative for weakness, numbness and headaches.  All other systems reviewed and are negative.    Physical Exam Updated Vital Signs BP 90/64   Pulse 82   Temp 97.6 F (36.4 C)   Resp 23   SpO2 100%   Physical Exam  Constitutional: He appears well-developed and well-nourished. No distress.  HENT:  Head: Normocephalic and atraumatic.  Right Ear: Tympanic membrane and ear canal normal.  Left Ear: Tympanic membrane and ear canal normal.  Mouth/Throat: Oropharynx is clear and moist.    Contusion noted to the left parietal bone. Denies any pain.     Eyes: Conjunctivae and EOM are normal. Pupils are equal, round, and reactive to light. Right eye exhibits no discharge. Left eye exhibits no discharge. No scleral icterus.  Neck: Normal range of motion. Neck supple. No thyromegaly present.  Denies any midline tenderness, paraspinal tenderness.  Cardiovascular: Normal rate, regular rhythm, normal heart sounds and intact distal pulses.   Pulmonary/Chest: Effort normal and breath sounds normal. He has no wheezes.  Non productive cough noted  Abdominal: Soft. Bowel sounds are normal. He exhibits no distension. There is no tenderness. There is no rebound and no guarding.  Musculoskeletal: Normal range of motion.  Lymphadenopathy:    He has no cervical adenopathy.  Neurological: He is alert. No cranial nerve deficit or sensory deficit. Coordination normal.  The patient is alert, attentive, and oriented x 3. Speech is clear. Cranial nerve II-VII grossly intact. Negative pronator drift. Sensation intact. Strength 5/5 in all extremities. Reflexes 2+ and  symmetric at biceps, triceps, knees, and ankles. Rapid alternating movement and fine finger movements intact.    Skin: Skin is warm and dry.  Vitals reviewed.    ED Treatments / Results  Labs (all labs ordered are listed, but only abnormal results are displayed) Labs Reviewed  COMPREHENSIVE METABOLIC PANEL - Abnormal; Notable for the following:       Result Value   Sodium 133 (*)    Chloride 99 (*)    BUN 22 (*)    Calcium 8.8 (*)    Total Protein 8.6 (*)    Albumin 3.2 (*)    ALT 15 (*)    All other components within normal limits  CBC WITH DIFFERENTIAL/PLATELET - Abnormal; Notable for the following:    RBC 3.04 (*)    Hemoglobin 9.1 (*)    HCT 26.9 (*)    Lymphs Abs 0.4 (*)    All other components within normal limits  URINALYSIS, ROUTINE W REFLEX MICROSCOPIC (NOT AT [facility]) - Abnormal; Notable for the following:    Color, Urine AMBER (*)    APPearance CLOUDY (*)    Bilirubin Urine SMALL (*)    All other components within normal  limits    EKG  EKG Interpretation None       Radiology Dg Chest 2 View  Result Date: 04/09/2016 CLINICAL DATA:  Patient status post fall. History of lung cancer. Weakness. EXAM: CHEST  2 VIEW COMPARISON:  Chest radiograph 09/15/2015. FINDINGS: Monitoring leads overlie the patient. Normal cardiac and mediastinal contours. Left upper lobe perihilar consolidation. Right lung is clear. No pleural effusion or pneumothorax. Tenting of the left hemidiaphragm. Regional skeleton is unremarkable. IMPRESSION: No acute process within the chest. Probable post radiation changes within the left lung. Electronically Signed   By: Lovey Newcomer M.D.   On: 04/09/2016 14:23   Ct Head Wo Contrast  Result Date: 04/09/2016 CLINICAL DATA:  Status post fall, generalized pain EXAM: CT HEAD WITHOUT CONTRAST CT CERVICAL SPINE WITHOUT CONTRAST TECHNIQUE: Multidetector CT imaging of the head and cervical spine was performed following the standard protocol without intravenous  contrast. Multiplanar CT image reconstructions of the cervical spine were also generated. COMPARISON:  None. FINDINGS: CT HEAD FINDINGS Brain: No evidence of acute infarction, hemorrhage, hydrocephalus, extra-axial collection or mass lesion/mass effect. Vascular: No hyperdense vessel or unexpected calcification. Skull: No osseous abnormality. Sinuses/Orbits: Visualized paranasal sinuses are clear. Visualized mastoid sinuses are clear. Visualized orbits demonstrate no focal abnormality. Other: None CT CERVICAL SPINE FINDINGS Alignment: Normal. Skull base and vertebrae: No acute fracture. No primary bone lesion or focal pathologic process. Soft tissues and spinal canal: No prevertebral fluid or swelling. No visible canal hematoma. Disc levels: Mild degenerative disc disease at C5-6 with a broad-based disc osteophyte complex impressing on the thecal sac. Upper chest: Negative. Other: No fluid collection or hematoma. IMPRESSION: 1. No acute intracranial pathology. 2. No acute osseous injury of the cervical spine. 3. Degenerative disc disease and mild broad-based disc osteophyte complex at C5-6. Electronically Signed   By: Kathreen Devoid   On: 04/09/2016 14:36   Ct Cervical Spine Wo Contrast  Result Date: 04/09/2016 CLINICAL DATA:  Status post fall, generalized pain EXAM: CT HEAD WITHOUT CONTRAST CT CERVICAL SPINE WITHOUT CONTRAST TECHNIQUE: Multidetector CT imaging of the head and cervical spine was performed following the standard protocol without intravenous contrast. Multiplanar CT image reconstructions of the cervical spine were also generated. COMPARISON:  None. FINDINGS: CT HEAD FINDINGS Brain: No evidence of acute infarction, hemorrhage, hydrocephalus, extra-axial collection or mass lesion/mass effect. Vascular: No hyperdense vessel or unexpected calcification. Skull: No osseous abnormality. Sinuses/Orbits: Visualized paranasal sinuses are clear. Visualized mastoid sinuses are clear. Visualized orbits  demonstrate no focal abnormality. Other: None CT CERVICAL SPINE FINDINGS Alignment: Normal. Skull base and vertebrae: No acute fracture. No primary bone lesion or focal pathologic process. Soft tissues and spinal canal: No prevertebral fluid or swelling. No visible canal hematoma. Disc levels: Mild degenerative disc disease at C5-6 with a broad-based disc osteophyte complex impressing on the thecal sac. Upper chest: Negative. Other: No fluid collection or hematoma. IMPRESSION: 1. No acute intracranial pathology. 2. No acute osseous injury of the cervical spine. 3. Degenerative disc disease and mild broad-based disc osteophyte complex at C5-6. Electronically Signed   By: Kathreen Devoid   On: 04/09/2016 14:36    Procedures Procedures (including critical care time)  Medications Ordered in ED Medications  sodium chloride 0.9 % bolus 1,000 mL (0 mLs Intravenous Stopped 04/09/16 1725)  sodium chloride 0.9 % bolus 1,000 mL (0 mLs Intravenous Stopped 04/09/16 1934)     Initial Impression / Assessment and Plan / ED Course  I have reviewed the triage vital signs  and the nursing notes.  Pertinent labs & imaging results that were available during my care of the patient were reviewed by me and considered in my medical decision making (see chart for details).  Clinical Course  Patient presents to the ED today with fall likely due to syncope secondary to poor po intake. Patient was noted to be hypotensive on exam. No tachycardia noted. CT scan of head and neck showed no acute abnormalities. Chest xray without acute changes. Labs show low hgb and sodium. Patient baseline hgb is 9 and history of hyponatremia in past. Labs and ua consistent with dehydration and poor po intake. Patient was noted to be orthostatic. Patient given 2 L of fluids and able to keep down food and po fluid. He is able to ambulate without dizziness and has improved blood pressure, now upon ambulating. He has a follow up appointment with his  Oncologist in 2 days. He has been able to eat and drink here in the emergency department and states he feels better. He complains of coughing postprandial, for 1 year. Will start patient on zantac likely acid reflux. Patient with poor nutrition secondary to lung cancer and chemo therapy. I doubt serious bacterial infection. No leukocytosis. Patient is afebrile. Patient with low blood pressure but not tachycardic and symptoms improved with fluids. Past review of blood pressure patient with low bp readings on several occasions. Patient seen and evaluated by Dr. Eulis Foster who agrees with plan. Patient given strict return precautions given. Encouraged to follow up with his oncologist this week. Hemodynamically stable and in NAD. Will be discharge home with stable VS. Patient verbalized understanding with plan of care.     Final Clinical Impressions(s) / ED Diagnoses   Final diagnoses:  Dehydration  Fall, initial encounter  Head contusion, initial encounter    New Prescriptions Discharge Medication List as of 04/09/2016  6:44 PM    START taking these medications   Details  ranitidine (ZANTAC) 150 MG capsule Take 1 capsule (150 mg total) by mouth daily., Starting Mon 04/09/2016, Print         Doristine Devoid, PA-C 04/10/16 [place], MD 04/12/16 2206    Daleen Bo, MD 04/12/16 2208

## 2016-04-09 NOTE — ED Triage Notes (Signed)
Per EMS report: pt was at work and started feeling dizzy and sat down.  Pt then called EMS d/t "not feeling well."  Pt denies any pain.  Pt reports feeling weak frequently but today it is worse.  Pt is a CA pt and last had chemo a month ago.

## 2016-04-09 NOTE — ED Provider Notes (Signed)
Face-to-face evaluation   History: He presents for evaluation of ongoing dizziness which ultimately lead to syncope today. He reports decreased appetite recently, with some vomiting daily for several days, toothache, and feeling weak. No new medications recently. No history of hypertension.  Physical exam: Alert male, alert, cooperative. He appears older than stated age. Abdomen soft, nontender to palpation. Lungs, no respiratory distress   Medications  sodium chloride 0.9 % bolus 1,000 mL (0 mLs Intravenous Stopped 04/09/16 1725)  sodium chloride 0.9 % bolus 1,000 mL (1,000 mLs Intravenous New Bag/Given 04/09/16 1735)    Patient Vitals for the past 24 hrs:  BP Temp Pulse Resp SpO2  04/09/16 1823 91/68 - 100 (!) 33 100 %  04/09/16 1723 (!) 78/64 - 98 25 100 %  04/09/16 1645 - - 71 16 100 %  04/09/16 1458 (!) 88/66 - 87 (!) 33 100 %  04/09/16 1356 90/64 - 82 23 100 %  04/09/16 1222 99/82 97.6 F (36.4 C) 90 14 100 %  04/09/16 1215 - - - - 100 %    Results for orders placed or performed during the hospital encounter of 04/09/16  Comprehensive metabolic panel  Result Value Ref Range   Sodium 133 (L) 135 - 145 mmol/L   Potassium 3.8 3.5 - 5.1 mmol/L   Chloride 99 (L) 101 - 111 mmol/L   CO2 25 22 - 32 mmol/L   Glucose, Bld 96 65 - 99 mg/dL   BUN 22 (H) 6 - 20 mg/dL   Creatinine, Ser 0.96 0.61 - 1.24 mg/dL   Calcium 8.8 (L) 8.9 - 10.3 mg/dL   Total Protein 8.6 (H) 6.5 - 8.1 g/dL   Albumin 3.2 (L) 3.5 - 5.0 g/dL   AST 19 15 - 41 U/L   ALT 15 (L) 17 - 63 U/L   Alkaline Phosphatase 74 38 - 126 U/L   Total Bilirubin 0.4 0.3 - 1.2 mg/dL   GFR calc non Af Amer >60 >60 mL/min   GFR calc Af Amer >60 >60 mL/min   Anion gap 9 5 - 15  CBC with Differential  Result Value Ref Range   WBC 4.1 4.0 - 10.5 K/uL   RBC 3.04 (L) 4.22 - 5.81 MIL/uL   Hemoglobin 9.1 (L) 13.0 - 17.0 g/dL   HCT 26.9 (L) 39.0 - 52.0 %   MCV 88.5 78.0 - 100.0 fL   MCH 29.9 26.0 - 34.0 pg   MCHC 33.8 30.0 - 36.0  g/dL   RDW 14.1 11.5 - 15.5 %   Platelets 275 150 - 400 K/uL   Neutrophils Relative % 83 %   Neutro Abs 3.4 1.7 - 7.7 K/uL   Lymphocytes Relative 9 %   Lymphs Abs 0.4 (L) 0.7 - 4.0 K/uL   Monocytes Relative 8 %   Monocytes Absolute 0.3 0.1 - 1.0 K/uL   Eosinophils Relative 0 %   Eosinophils Absolute 0.0 0.0 - 0.7 K/uL   Basophils Relative 0 %   Basophils Absolute 0.0 0.0 - 0.1 K/uL  Urinalysis, Routine w reflex microscopic  Result Value Ref Range   Color, Urine AMBER (A) YELLOW   APPearance CLOUDY (A) CLEAR   Specific Gravity, Urine 1.017 1.005 - 1.030   pH 5.5 5.0 - 8.0   Glucose, UA NEGATIVE NEGATIVE mg/dL   Hgb urine dipstick NEGATIVE NEGATIVE   Bilirubin Urine SMALL (A) NEGATIVE   Ketones, ur NEGATIVE NEGATIVE mg/dL   Protein, ur NEGATIVE NEGATIVE mg/dL   Nitrite NEGATIVE NEGATIVE  Leukocytes, UA NEGATIVE NEGATIVE    6:30 PM Reevaluation with update and discussion. After initial assessment and treatment, an updated evaluation reveals He is able to ambulate without dizziness and has improved blood pressure, now upon ambulating. He has a follow up appointment with his Oncologist in 2 days. He has been able to eat and drink here in the emergency department and states he feels better. He complains of coughing postprandial, for 1 year. Findings discussed with patient and all questions answered. Esmeralda Blanford L   Medical decision-making- nonspecific malaise. Patient appears to have very poor nutrition related to multiple factors, including chemotherapy. No overt sign for acute onset oncologic crisis, doubt serious bacterial infection or metabolic instability. Low blood pressure, without significant compromise. I suspect that he has had borderline low blood pressure for an undetermined subacute period.    Medical screening examination/treatment/procedure(s) were conducted as a shared visit with non-physician practitioner(s) and myself.  I personally evaluated the patient during the  encounter   Daleen Bo, MD 04/12/16 2209

## 2016-04-09 NOTE — ED Notes (Signed)
PA at bedside.

## 2016-04-09 NOTE — ED Notes (Addendum)
Chrissie Noa, PA made aware of pt's BP and pt's orthstatic changes.  Pt remains a/o x 4 with no acute distress.  Pt given water and was able to tolerate PO fluids.  PA gave verbal order to hang a 1L of NaCl.  Fluids given to pt.

## 2016-04-09 NOTE — ED Triage Notes (Signed)
Pt reports that he fell but he has no memory of the fall.  Pt reports pain in his head. Abrasion noted to pt's head.

## 2016-04-09 NOTE — Discharge Instructions (Signed)
Please take Zantac once a day for possible  acid reflux. Continue follow-up with her oncologist in 2 days. Avoid going from a sitting to a standing position quickly. Drink plenty of fluids and increase food intake. Please return to the ED if your symptoms worsen or for any other reason. Follow up with your primary care doctor if symptoms worsen.

## 2016-04-09 NOTE — Progress Notes (Signed)
Patient listed as having [place] insurance without insurance.  Patient also listed as having Medicaid insurance.  Pcp listed on patient's Medicaid card is located at [name].  EDCM spoke to patient at bedside.  Patient reports he is seen by [name] at the cancer center.  He confirms he has an upcoming appointment on 09/27 at 1530 with [name].  He reports he is aware that his pcp is located at the [name] on [name], he just hasn't been there in a while.  [facility] encouraged patient to make a follow up appointment to re establish care.  [facility] informed patient if he wishes to change the pcp on his Medicaid card he must notify the DSS.  Patient verbalized understanding.

## 2016-04-09 NOTE — ED Notes (Signed)
Bed: WA03 Expected date:  Expected time:  Means of arrival:  Comments: EMS - 56 yo weakness

## 2016-04-09 NOTE — ED Notes (Signed)
Successful IV attempt but unable to obtain blood.

## 2016-04-10 ENCOUNTER — Ambulatory Visit: Payer: BLUE CROSS/BLUE SHIELD | Admitting: Internal Medicine

## 2016-04-11 ENCOUNTER — Ambulatory Visit (HOSPITAL_BASED_OUTPATIENT_CLINIC_OR_DEPARTMENT_OTHER): Payer: BLUE CROSS/BLUE SHIELD

## 2016-04-11 ENCOUNTER — Inpatient Hospital Stay (HOSPITAL_COMMUNITY)
Admission: EM | Admit: 2016-04-11 | Discharge: 2016-04-14 | DRG: 871 | Disposition: A | Discharge status: Home / Self Care | Payer: BLUE CROSS/BLUE SHIELD | Attending: Internal Medicine | Admitting: Internal Medicine

## 2016-04-11 ENCOUNTER — Ambulatory Visit (HOSPITAL_BASED_OUTPATIENT_CLINIC_OR_DEPARTMENT_OTHER): Payer: BLUE CROSS/BLUE SHIELD | Admitting: Internal Medicine

## 2016-04-11 ENCOUNTER — Encounter: Payer: Self-pay | Admitting: Internal Medicine

## 2016-04-11 ENCOUNTER — Emergency Department (HOSPITAL_COMMUNITY): Payer: BLUE CROSS/BLUE SHIELD

## 2016-04-11 ENCOUNTER — Encounter (HOSPITAL_COMMUNITY): Payer: Self-pay | Admitting: Emergency Medicine

## 2016-04-11 ENCOUNTER — Other Ambulatory Visit: Payer: Self-pay

## 2016-04-11 ENCOUNTER — Inpatient Hospital Stay (HOSPITAL_COMMUNITY): Payer: BLUE CROSS/BLUE SHIELD

## 2016-04-11 VITALS — BP 90/71 | HR 99 | Temp 99.2°F | Resp 18 | Ht 73.0 in | Wt 127.1 lb

## 2016-04-11 DIAGNOSIS — R6883 Chills (without fever): Secondary | ICD-10-CM

## 2016-04-11 DIAGNOSIS — R509 Fever, unspecified: Secondary | ICD-10-CM | POA: Diagnosis present

## 2016-04-11 DIAGNOSIS — G8384 Todd's paralysis (postepileptic): Secondary | ICD-10-CM | POA: Diagnosis not present

## 2016-04-11 DIAGNOSIS — E46 Unspecified protein-calorie malnutrition: Secondary | ICD-10-CM | POA: Diagnosis present

## 2016-04-11 DIAGNOSIS — C3412 Malignant neoplasm of upper lobe, left bronchus or lung: Secondary | ICD-10-CM | POA: Diagnosis present

## 2016-04-11 DIAGNOSIS — Z9221 Personal history of antineoplastic chemotherapy: Secondary | ICD-10-CM

## 2016-04-11 DIAGNOSIS — Z720 Tobacco use: Secondary | ICD-10-CM | POA: Diagnosis not present

## 2016-04-11 DIAGNOSIS — Z809 Family history of malignant neoplasm, unspecified: Secondary | ICD-10-CM | POA: Diagnosis not present

## 2016-04-11 DIAGNOSIS — J189 Pneumonia, unspecified organism: Secondary | ICD-10-CM | POA: Diagnosis present

## 2016-04-11 DIAGNOSIS — E871 Hypo-osmolality and hyponatremia: Secondary | ICD-10-CM | POA: Diagnosis present

## 2016-04-11 DIAGNOSIS — G8194 Hemiplegia, unspecified affecting left nondominant side: Secondary | ICD-10-CM

## 2016-04-11 DIAGNOSIS — A419 Sepsis, unspecified organism: Principal | ICD-10-CM

## 2016-04-11 DIAGNOSIS — D649 Anemia, unspecified: Secondary | ICD-10-CM | POA: Diagnosis present

## 2016-04-11 DIAGNOSIS — R1310 Dysphagia, unspecified: Secondary | ICD-10-CM

## 2016-04-11 DIAGNOSIS — Z923 Personal history of irradiation: Secondary | ICD-10-CM | POA: Diagnosis not present

## 2016-04-11 DIAGNOSIS — E876 Hypokalemia: Secondary | ICD-10-CM | POA: Diagnosis present

## 2016-04-11 DIAGNOSIS — R569 Unspecified convulsions: Secondary | ICD-10-CM | POA: Diagnosis not present

## 2016-04-11 DIAGNOSIS — Z681 Body mass index (BMI) 19 or less, adult: Secondary | ICD-10-CM

## 2016-04-11 DIAGNOSIS — I639 Cerebral infarction, unspecified: Secondary | ICD-10-CM

## 2016-04-11 DIAGNOSIS — F17210 Nicotine dependence, cigarettes, uncomplicated: Secondary | ICD-10-CM | POA: Diagnosis present

## 2016-04-11 DIAGNOSIS — R29898 Other symptoms and signs involving the musculoskeletal system: Secondary | ICD-10-CM

## 2016-04-11 DIAGNOSIS — Z79899 Other long term (current) drug therapy: Secondary | ICD-10-CM | POA: Diagnosis not present

## 2016-04-11 HISTORY — DX: Fever, unspecified: R50.9

## 2016-04-11 LAB — COMPREHENSIVE METABOLIC PANEL
ALBUMIN: 2.7 g/dL — AB (ref 3.5–5.0)
ALK PHOS: 86 U/L (ref 38–126)
ALT: 18 U/L (ref 17–63)
AST: 28 U/L (ref 15–41)
Anion gap: 8 (ref 5–15)
BILIRUBIN TOTAL: 0.6 mg/dL (ref 0.3–1.2)
BUN: 16 mg/dL (ref 6–20)
CALCIUM: 7.9 mg/dL — AB (ref 8.9–10.3)
CO2: 25 mmol/L (ref 22–32)
CREATININE: 0.83 mg/dL (ref 0.61–1.24)
Chloride: 95 mmol/L — ABNORMAL LOW (ref 101–111)
GFR calc Af Amer: >60 mL/min (ref >60)
GFR calc non Af Amer: >60 mL/min (ref >60)
GLUCOSE: 94 mg/dL (ref 65–99)
Potassium: 3.2 mmol/L — ABNORMAL LOW (ref 3.5–5.1)
SODIUM: 128 mmol/L — AB (ref 135–145)
Total Protein: 7.3 g/dL (ref 6.5–8.1)

## 2016-04-11 LAB — CBC
HEMATOCRIT: 22.8 % — AB (ref 39.0–52.0)
HEMOGLOBIN: 7.7 g/dL — AB (ref 13.0–17.0)
MCH: 30.2 pg (ref 26.0–34.0)
MCHC: 33.8 g/dL (ref 30.0–36.0)
MCV: 89.4 fL (ref 78.0–100.0)
Platelets: 208 10*3/uL (ref 150–400)
RBC: 2.55 MIL/uL — ABNORMAL LOW (ref 4.22–5.81)
RDW: 14.3 % (ref 11.5–15.5)
WBC: 3.6 10*3/uL — ABNORMAL LOW (ref 4.0–10.5)

## 2016-04-11 LAB — CREATININE, SERUM
Creatinine, Ser: 0.78 mg/dL (ref 0.61–1.24)
GFR calc Af Amer: >60 mL/min (ref >60)

## 2016-04-11 LAB — CBC WITH DIFFERENTIAL/PLATELET
BASOS PCT: 0 %
Basophils Absolute: 0 10*3/uL (ref 0.0–0.1)
EOS ABS: 0 10*3/uL (ref 0.0–0.7)
Eosinophils Relative: 0 %
HCT: 23.6 % — ABNORMAL LOW (ref 39.0–52.0)
HEMOGLOBIN: 8.2 g/dL — AB (ref 13.0–17.0)
LYMPHS ABS: 0.4 10*3/uL — AB (ref 0.7–4.0)
LYMPHS PCT: 11 %
MCH: 30.1 pg (ref 26.0–34.0)
MCHC: 34.7 g/dL (ref 30.0–36.0)
MCV: 86.8 fL (ref 78.0–100.0)
MONO ABS: 0.4 10*3/uL (ref 0.1–1.0)
Monocytes Relative: 9 %
NEUTROS ABS: 3.1 10*3/uL (ref 1.7–7.7)
Neutrophils Relative %: 80 %
Platelets: 218 10*3/uL (ref 150–400)
RBC: 2.72 MIL/uL — ABNORMAL LOW (ref 4.22–5.81)
RDW: 14.3 % (ref 11.5–15.5)
WBC: 3.9 10*3/uL — ABNORMAL LOW (ref 4.0–10.5)

## 2016-04-11 LAB — TROPONIN I: Troponin I: <0.03 ng/mL (ref <0.03)

## 2016-04-11 LAB — LACTIC ACID, PLASMA: LACTIC ACID, VENOUS: 0.8 mmol/L (ref 0.5–1.9)

## 2016-04-11 LAB — I-STAT CG4 LACTIC ACID, ED: Lactic Acid, Venous: 1.43 mmol/L (ref 0.5–1.9)

## 2016-04-11 LAB — PROCALCITONIN: Procalcitonin: 8.83 ng/mL

## 2016-04-11 MED ORDER — GUAIFENESIN-DM 100-10 MG/5ML PO SYRP: 5.0000 mL | ORAL_SOLUTION | ORAL | Status: DC | PRN

## 2016-04-11 MED ORDER — ACETAMINOPHEN 325 MG PO TABS: 650.0000 mg | ORAL_TABLET | Freq: Four times a day (QID) | ORAL | Status: DC | PRN

## 2016-04-11 MED ORDER — SODIUM CHLORIDE 0.9 % IV BOLUS (SEPSIS)
1000.0000 mL | Freq: Once | INTRAVENOUS | Status: AC
  Administered 2016-04-11: 1000 mL via INTRAVENOUS

## 2016-04-11 MED ORDER — ACETAMINOPHEN 650 MG RE SUPP: 650.0000 mg | Freq: Four times a day (QID) | RECTAL | Status: DC | PRN

## 2016-04-11 MED ORDER — VANCOMYCIN HCL IN DEXTROSE 750-5 MG/150ML-% IV SOLN
750.0000 mg | Freq: Two times a day (BID) | INTRAVENOUS | Status: DC
  Administered 2016-04-12 – 2016-04-13 (×3): 750 mg via INTRAVENOUS
  Filled 2016-04-11 (×4): qty 150

## 2016-04-11 MED ORDER — ENOXAPARIN SODIUM 40 MG/0.4ML ~~LOC~~ SOLN
40.0000 mg | SUBCUTANEOUS | Status: DC
  Administered 2016-04-12 – 2016-04-13 (×3): 40 mg via SUBCUTANEOUS
  Filled 2016-04-11 (×3): qty 0.4

## 2016-04-11 MED ORDER — ONDANSETRON HCL 4 MG PO TABS: 4.0000 mg | ORAL_TABLET | Freq: Four times a day (QID) | ORAL | Status: DC | PRN

## 2016-04-11 MED ORDER — SODIUM CHLORIDE 0.9 % IV SOLN
INTRAVENOUS | Status: AC
  Administered 2016-04-12 (×2): via INTRAVENOUS

## 2016-04-11 MED ORDER — PIPERACILLIN-TAZOBACTAM 3.375 G IVPB 30 MIN
3.3750 g | Freq: Once | INTRAVENOUS | Status: AC
  Administered 2016-04-11: 3.375 g via INTRAVENOUS
  Filled 2016-04-11: qty 50

## 2016-04-11 MED ORDER — ONDANSETRON HCL 4 MG/2ML IJ SOLN: 4.0000 mg | Freq: Four times a day (QID) | INTRAMUSCULAR | Status: DC | PRN

## 2016-04-11 MED ORDER — PIPERACILLIN-TAZOBACTAM 3.375 G IVPB
3.3750 g | Freq: Three times a day (TID) | INTRAVENOUS | Status: DC
  Administered 2016-04-12 – 2016-04-14 (×7): 3.375 g via INTRAVENOUS
  Filled 2016-04-11 (×10): qty 50

## 2016-04-11 MED ORDER — LEVOFLOXACIN 500 MG PO TABS: 500.0000 mg | ORAL_TABLET | Freq: Every day | ORAL | 0 refills | Status: DC

## 2016-04-11 MED ORDER — SODIUM CHLORIDE 0.9 % IV BOLUS (SEPSIS)
500.0000 mL | Freq: Once | INTRAVENOUS | Status: AC
  Administered 2016-04-11: 500 mL via INTRAVENOUS

## 2016-04-11 MED ORDER — LEVOFLOXACIN IN D5W 500 MG/100ML IV SOLN
500.0000 mg | Freq: Once | INTRAVENOUS | Status: AC
  Administered 2016-04-11: 500 mg via INTRAVENOUS
  Filled 2016-04-11: qty 100

## 2016-04-11 MED ORDER — SODIUM CHLORIDE 0.9 % IV BOLUS (SEPSIS)
250.0000 mL | Freq: Once | INTRAVENOUS | Status: AC
  Administered 2016-04-11: 250 mL via INTRAVENOUS

## 2016-04-11 MED ORDER — ACETAMINOPHEN 325 MG PO TABS
ORAL_TABLET | ORAL | Status: AC
  Filled 2016-04-11: qty 2

## 2016-04-11 MED ORDER — VANCOMYCIN HCL IN DEXTROSE 1-5 GM/200ML-% IV SOLN
1000.0000 mg | Freq: Once | INTRAVENOUS | Status: AC
  Administered 2016-04-11: 1000 mg via INTRAVENOUS
  Filled 2016-04-11: qty 200

## 2016-04-11 MED ORDER — ACETAMINOPHEN 325 MG PO TABS
650.0000 mg | ORAL_TABLET | Freq: Once | ORAL | Status: AC
  Administered 2016-04-11: 650 mg via ORAL

## 2016-04-11 MED ORDER — SODIUM CHLORIDE 0.9 % IV SOLN
Freq: Once | INTRAVENOUS | Status: AC
  Administered 2016-04-11: 16:00:00 via INTRAVENOUS

## 2016-04-11 MED ORDER — ACETAMINOPHEN 500 MG PO TABS
500.0000 mg | ORAL_TABLET | Freq: Once | ORAL | Status: AC
  Administered 2016-04-11: 500 mg via ORAL
  Filled 2016-04-11: qty 1

## 2016-04-11 NOTE — H&P (Signed)
History and Physical    Mylan Schwarz Kuna JSE:[id number] DOB: 1960-09-21 DOA: 04/11/2016  PCP: No PCP Per Patient  Patient coming from: Home.  Chief Complaint: Fever and hypotension.  HPI: DELMA VILLALVA is a 55 y.o. male with non-small cell lung cancer status post chemoradiation had followed up with patient's oncologist today and at the office patient was found to be hypotensive and febrile. Patient had a temperature of 103F. Patient was initially started on Levaquin at the oncology office but since patient was persistently hypotensive patient was transferred to ER at [facility]. In the ER patient was hypotensive and was given fluid bolus. Blood cultures were obtained and patient was started on empiric antibiotics for sepsis. Chest x-ray and UA does not show anything acute. Patient denies any nausea vomiting abdominal pain diarrhea chest pain or shortness of breath. But patient states he has been a persistent productive cough for last few weeks. Patient is being admitted for sepsis.   ED Course: Was given fluid bolus and started on empiric antibiotics for sepsis. Blood cultures were sent.  Review of Systems: As per HPI, rest all negative.   Past Medical History:  Diagnosis Date  . Cancer ([place])    Lung Cancer  . Encounter for antineoplastic chemotherapy 10/17/2015  . Fever and chills 04/11/2016  . Malnutrition ([place]) 09/28/2015  . Needs smoking cessation education 09/28/2015  . Radiation 10/17/15-11/25/15   left chest 60 Gy    Past Surgical History:  Procedure Laterality Date  . VIDEO BRONCHOSCOPY Bilateral 09/09/2015   Diagnosis     reports that he has been smoking Cigarettes.  He started smoking about 30 years ago. He has been smoking about 0.10 packs per day. He has never used smokeless tobacco. He reports that he does not drink alcohol or use drugs.  No Known Allergies  Family History  Problem Relation Age of Onset  . Cancer Sister     Prior to Admission medications     Medication Sig Start Date End Date Taking? Authorizing Provider  guaiFENesin-dextromethorphan (ROBITUSSIN DM) 100-10 MG/5ML syrup Take 5 mLs by mouth every 4 (four) hours as needed for cough. 02/15/16  Yes Curt Bears, MD  ibuprofen (ADVIL,MOTRIN) 200 MG tablet Take 800 mg by mouth every 6 (six) hours as needed for moderate pain. Reported on 01/05/2016   Yes Historical Provider, MD  naproxen sodium (ANAPROX) 220 MG tablet Take 220-440 mg by mouth every 12 (twelve) hours as needed (for pain).   Yes Historical Provider, MD  prochlorperazine (COMPAZINE) 10 MG tablet Take 1 tablet (10 mg total) by mouth every 6 (six) hours as needed for nausea or vomiting. 10/05/15  Yes Curt Bears, MD  levofloxacin (LEVAQUIN) 500 MG tablet Take 1 tablet (500 mg total) by mouth daily. Patient not taking: Reported on 04/11/2016 04/11/16   Curt Bears, MD  ranitidine (ZANTAC) 150 MG capsule Take 1 capsule (150 mg total) by mouth daily. Patient not taking: Reported on 04/11/2016 04/09/16   Doristine Devoid, PA-C  sucralfate (CARAFATE) 1 GM/10ML suspension Take 10 mLs (1 g total) by mouth 4 (four) times daily -  with meals and at bedtime. Patient not taking: Reported on 04/11/2016 11/14/15   Curt Bears, MD    Physical Exam: Vitals:   04/11/16 1943 04/11/16 2022 04/11/16 2134 04/11/16 2145  BP: (!) 86/58 (!) 88/62 (!) 86/61 (!) 88/64  Pulse: 73 70 66 79  Resp: '18 18 14 18  '$ Temp: 100.4 F (38 C)   99.2  F (37.3 C)  TempSrc: Oral   Oral  SpO2: 100% 100% 100% 98%  Weight:      Height:          Constitutional: Not in distress. Vitals:   04/11/16 1943 04/11/16 2022 04/11/16 2134 04/11/16 2145  BP: (!) 86/58 (!) 88/62 (!) 86/61 (!) 88/64  Pulse: 73 70 66 79  Resp: '18 18 14 18  '$ Temp: 100.4 F (38 C)   99.2 F (37.3 C)  TempSrc: Oral   Oral  SpO2: 100% 100% 100% 98%  Weight:      Height:       Eyes: Anicteric no pallor. ENMT: No discharge from the years eyes nose or mouth. Neck: No JVD  appreciated no mass felt. Respiratory: No rhonchi or crepitations. Cardiovascular: S1-S2 heard. Abdomen: Soft nontender bowel sounds present. No guarding or rigidity. Musculoskeletal: No edema. Skin: No rash. Neurologic: Moves all extremities no facial asymmetry in the midline. Psychiatric: Alert awake oriented to time place and person. Normal affect.   Labs on Admission: I have personally reviewed following labs and imaging studies  CBC:  Recent Labs Lab 04/09/16 1508 04/11/16 1819  WBC 4.1 3.9*  NEUTROABS 3.4 3.1  HGB 9.1* 8.2*  HCT 26.9* 23.6*  MCV 88.5 86.8  PLT 275 157   Basic Metabolic Panel:  Recent Labs Lab 04/09/16 1508 04/11/16 1819  NA 133* 128*  K 3.8 3.2*  CL 99* 95*  CO2 25 25  GLUCOSE 96 94  BUN 22* 16  CREATININE 0.96 0.83  CALCIUM 8.8* 7.9*   GFR: Estimated Creatinine Clearance: 80 mL/min (by C-G formula based on SCr of 0.83 mg/dL). Liver Function Tests:  Recent Labs Lab 04/09/16 1508 04/11/16 1819  AST 19 28  ALT 15* 18  ALKPHOS 74 86  BILITOT 0.4 0.6  PROT 8.6* 7.3  ALBUMIN 3.2* 2.7*   No results for input(s): LIPASE, AMYLASE in the last 168 hours. No results for input(s): AMMONIA in the last 168 hours. Coagulation Profile: No results for input(s): INR, PROTIME in the last 168 hours. Cardiac Enzymes: No results for input(s): CKTOTAL, CKMB, CKMBINDEX, TROPONINI in the last 168 hours. BNP (last 3 results) No results for input(s): PROBNP in the last 8760 hours. HbA1C: No results for input(s): HGBA1C in the last 72 hours. CBG: No results for input(s): GLUCAP in the last 168 hours. Lipid Profile: No results for input(s): CHOL, HDL, LDLCALC, TRIG, CHOLHDL, LDLDIRECT in the last 72 hours. Thyroid Function Tests: No results for input(s): TSH, T4TOTAL, FREET4, T3FREE, THYROIDAB in the last 72 hours. Anemia Panel: No results for input(s): VITAMINB12, FOLATE, FERRITIN, TIBC, IRON, RETICCTPCT in the last 72 hours. Urine analysis:      Component Value Date/Time   COLORURINE AMBER (A) 04/09/2016 1331   APPEARANCEUR CLOUDY (A) 04/09/2016 1331   LABSPEC 1.017 04/09/2016 1331   PHURINE 5.5 04/09/2016 1331   GLUCOSEU NEGATIVE 04/09/2016 1331   HGBUR NEGATIVE 04/09/2016 1331   BILIRUBINUR SMALL (A) 04/09/2016 1331   KETONESUR NEGATIVE 04/09/2016 1331   PROTEINUR NEGATIVE 04/09/2016 1331   NITRITE NEGATIVE 04/09/2016 1331   LEUKOCYTESUR NEGATIVE 04/09/2016 1331   Sepsis Labs: '@LABRCNTIP'$ (procalcitonin:4,lacticidven:4) )No results found for this or any previous visit (from the past 240 hour(s)).   Radiological Exams on Admission: Dg Chest Portable 1 View  Result Date: 04/11/2016 CLINICAL DATA:  Acute onset of chills, fever and high blood pressure. Sepsis. Initial encounter. EXAM: PORTABLE CHEST 1 VIEW COMPARISON:  Chest radiograph from 09/25 04/09/2016, and CT of  the chest performed 04/04/2016 FINDINGS: Left perihilar postradiation changes are again noted, relatively similar to the prior study. No definite acute focal airspace consolidation is seen, though it cannot be entirely excluded. No pleural effusion or pneumothorax identified. The cardiomediastinal silhouette is normal in size. No acute osseous abnormalities are seen. IMPRESSION: Stable appearance to left perihilar postradiation changes. No definite acute focal airspace consolidation seen, though it cannot be entirely excluded. Electronically Signed   By: Garald Balding M.D.   On: 04/11/2016 19:11    EKG: Independently reviewed. Normal sinus rhythm with nonspecific T-wave changes.  Assessment/Plan Principal Problem:   Sepsis ([place]) Active Problems:   Malignant neoplasm of upper lobe of left lung (HCC)   Normochromic normocytic anemia    1. Sepsis source not clear could be from respiratory tract given the persistent productive cough - patient has been placed on vancomycin and Zosyn for sepsis unclear source. Check influenza PCR blood cultures urine cultures and  continue hydration. Check lactate and procalcitonin levels. 2. Leukopenia and anemia probably from chemotherapy - follow CBC. 3. Non-small cell lung cancer status post chemoradiation - being followed by oncologist.   DVT prophylaxis: Lovenox. Code Status: Full code.  Family Communication: Discussed with patient.  Disposition Plan: Home.  Consults called: None.  Admission status: Inpatient. Likely stay 2-3 days.    Rise Patience MD [facility] Pager [phone].  If 7PM-7AM, please contact night-coverage [email or web address] Password [facility]  04/11/2016, 9:59 PM

## 2016-04-11 NOTE — ED Triage Notes (Signed)
Patient is from cancer center and was there for [place].  He spiked a fever at clinic of 103.3.  Tylenol was administered.

## 2016-04-11 NOTE — ED Notes (Signed)
Bed: MZ58 Expected date:  Expected time:  Means of arrival:  Comments: Cancer center-fever

## 2016-04-11 NOTE — ED Provider Notes (Addendum)
[facility] DEPT Provider Note   CSN: [id number] Arrival date & time: 04/11/16  1743     History   Chief Complaint No chief complaint on file.   HPI Maurice Mcknight is a 55 y.o. male.  HPI  55 year old male with a history of lung cancer presents from the oncology center with fever. Patient states he was following up with his oncologist after he had a fall and in her emergency room visit 2 days ago. Patient states he feels fine but was told that he needed to come straight down to the ER. He was found to have a temperature of 103. He was initially given IV Levaquin but before this could be infused he started to have lower blood pressures and was transferred to the ER. There also having hard time with IV access. Patient states he has a chronic cough which led to his lung cancer diagnosis but this is unchanged. He denies shortness of breath, headache, chest pain, abdominal pain, or urinary symptoms. No back pain.  Past Medical History:  Diagnosis Date  . Cancer ([place])    Lung Cancer  . Encounter for antineoplastic chemotherapy 10/17/2015  . Fever and chills 04/11/2016  . Malnutrition ([place]) 09/28/2015  . Needs smoking cessation education 09/28/2015  . Radiation 10/17/15-11/25/15   left chest 60 Gy    Patient Active Problem List   Diagnosis Date Noted  . Fever and chills 04/11/2016  . Chemotherapy induced neutropenia ([place]) 02/15/2016  . Near syncope 10/24/2015  . Encounter for antineoplastic chemotherapy 10/17/2015  . Malnutrition ([place]) 09/28/2015  . Needs smoking cessation education 09/28/2015  . Pneumonia 09/12/2015  . Postobstructive pneumonia   . Malignant neoplasm of upper lobe of left lung [facility])     Past Surgical History:  Procedure Laterality Date  . VIDEO BRONCHOSCOPY Bilateral 09/09/2015   Diagnosis       Home Medications    Prior to Admission medications   Medication Sig Start Date End Date Taking? Authorizing Provider  guaiFENesin-dextromethorphan (ROBITUSSIN  DM) 100-10 MG/5ML syrup Take 5 mLs by mouth every 4 (four) hours as needed for cough. 02/15/16   Curt Bears, MD  ibuprofen (ADVIL,MOTRIN) 200 MG tablet Take 800 mg by mouth every 6 (six) hours as needed for moderate pain. Reported on 01/05/2016    Historical Provider, MD  levofloxacin (LEVAQUIN) 500 MG tablet Take 1 tablet (500 mg total) by mouth daily. 04/11/16   Curt Bears, MD  prochlorperazine (COMPAZINE) 10 MG tablet Take 1 tablet (10 mg total) by mouth every 6 (six) hours as needed for nausea or vomiting. 10/05/15   Curt Bears, MD  ranitidine (ZANTAC) 150 MG capsule Take 1 capsule (150 mg total) by mouth daily. 04/09/16   Doristine Devoid, PA-C  sucralfate (CARAFATE) 1 GM/10ML suspension Take 10 mLs (1 g total) by mouth 4 (four) times daily -  with meals and at bedtime. Patient not taking: Reported on 04/09/2016 11/14/15   Curt Bears, MD    Family History Family History  Problem Relation Age of Onset  . Cancer Sister     Social History Social History  Substance Use Topics  . Smoking status: Current Some Day Smoker    Packs/day: 0.10    Types: Cigarettes    Start date: 10/05/1985  . Smokeless tobacco: Never Used  . Alcohol use No     Allergies   Review of patient's allergies indicates no known allergies.   Review of Systems Review of Systems  Constitutional: Positive for fever.  Respiratory: Positive for cough. Negative for shortness of breath.   Cardiovascular: Negative for chest pain.  Gastrointestinal: Negative for abdominal pain, nausea and vomiting.  Genitourinary: Negative for dysuria.  Musculoskeletal: Negative for back pain.  Neurological: Negative for headaches.  All other systems reviewed and are negative.    Physical Exam Updated Vital Signs BP 93/73 (BP Location: Right Arm)   Pulse 79   Temp 99.3 F (37.4 C) (Oral)   Resp 18   Ht '6\' 1"'$  (1.854 m)   Wt 127 lb (57.6 kg)   SpO2 98%   BMI 16.76 kg/m   Physical Exam  Constitutional: He  is oriented to person, place, and time. He appears well-developed and well-nourished. No distress.  Frail, chronically ill appearing  HENT:  Head: Normocephalic and atraumatic.  Right Ear: External ear normal.  Left Ear: External ear normal.  Nose: Nose normal.  Eyes: Right eye exhibits no discharge. Left eye exhibits no discharge.  Neck: Neck supple.  Cardiovascular: Regular rhythm and normal heart sounds.  Tachycardia present.   Pulmonary/Chest: Effort normal. He has wheezes (mild expiratory wheezes).  Abdominal: Soft. He exhibits no distension. There is no tenderness.  Musculoskeletal: He exhibits no edema.  Neurological: He is alert and oriented to person, place, and time.  Skin: Skin is warm and dry. He is not diaphoretic.  Nursing note and vitals reviewed.    ED Treatments / Results  Labs (all labs ordered are listed, but only abnormal results are displayed) Labs Reviewed  COMPREHENSIVE METABOLIC PANEL - Abnormal; Notable for the following:       Result Value   Sodium 128 (*)    Potassium 3.2 (*)    Chloride 95 (*)    Calcium 7.9 (*)    Albumin 2.7 (*)    All other components within normal limits  CBC WITH DIFFERENTIAL/PLATELET - Abnormal; Notable for the following:    WBC 3.9 (*)    RBC 2.72 (*)    Hemoglobin 8.2 (*)    HCT 23.6 (*)    Lymphs Abs 0.4 (*)    All other components within normal limits  CBC - Abnormal; Notable for the following:    WBC 3.6 (*)    RBC 2.55 (*)    Hemoglobin 7.7 (*)    HCT 22.8 (*)    All other components within normal limits  CULTURE, BLOOD (ROUTINE X 2)  CULTURE, BLOOD (ROUTINE X 2)  URINE CULTURE  TROPONIN I  LACTIC ACID, PLASMA  PROCALCITONIN  CREATININE, SERUM  URINALYSIS, ROUTINE W REFLEX MICROSCOPIC (NOT AT [facility])  TROPONIN I  TROPONIN I  CBC WITH DIFFERENTIAL/PLATELET  COMPREHENSIVE METABOLIC PANEL  LACTIC ACID, PLASMA  INFLUENZA PANEL BY PCR (TYPE A & B, H1N1)  I-STAT CG4 LACTIC ACID, ED    EKG  EKG  Interpretation None       Radiology Dg Chest Portable 1 View  Result Date: 04/11/2016 CLINICAL DATA:  Acute onset of chills, fever and high blood pressure. Sepsis. Initial encounter. EXAM: PORTABLE CHEST 1 VIEW COMPARISON:  Chest radiograph from 09/25 04/09/2016, and CT of the chest performed 04/04/2016 FINDINGS: Left perihilar postradiation changes are again noted, relatively similar to the prior study. No definite acute focal airspace consolidation is seen, though it cannot be entirely excluded. No pleural effusion or pneumothorax identified. The cardiomediastinal silhouette is normal in size. No acute osseous abnormalities are seen. IMPRESSION: Stable appearance to left perihilar postradiation changes. No definite acute focal airspace consolidation seen, though it cannot be  entirely excluded. Electronically Signed   By: Garald Balding M.D.   On: 04/11/2016 19:11    Procedures Procedures (including critical care time) CRITICAL CARE Performed by: Sherwood Gambler T   Total critical care time: 30 minutes  Critical care time was exclusive of separately billable procedures and treating other patients.  Critical care was necessary to treat or prevent imminent or life-threatening deterioration.  Critical care was time spent personally by me on the following activities: development of treatment plan with patient and/or surrogate as well as nursing, discussions with consultants, evaluation of patient's response to treatment, examination of patient, obtaining history from patient or surrogate, ordering and performing treatments and interventions, ordering and review of laboratory studies, ordering and review of radiographic studies, pulse oximetry and re-evaluation of patient's condition.   Medications Ordered in ED Medications  piperacillin-tazobactam (ZOSYN) IVPB 3.375 g (not administered)  vancomycin (VANCOCIN) IVPB 750 mg/150 ml premix (not administered)  guaiFENesin-dextromethorphan  (ROBITUSSIN DM) 100-10 MG/5ML syrup 5 mL (not administered)  acetaminophen (TYLENOL) tablet 650 mg (not administered)    Or  acetaminophen (TYLENOL) suppository 650 mg (not administered)  ondansetron (ZOFRAN) tablet 4 mg (not administered)    Or  ondansetron (ZOFRAN) injection 4 mg (not administered)  enoxaparin (LOVENOX) injection 40 mg (not administered)  0.9 %  sodium chloride infusion (not administered)  sodium chloride 0.9 % bolus 1,000 mL (0 mLs Intravenous Stopped 04/11/16 1944)    And  sodium chloride 0.9 % bolus 500 mL (0 mLs Intravenous Stopped 04/11/16 1945)    And  sodium chloride 0.9 % bolus 250 mL (0 mLs Intravenous Stopped 04/11/16 1945)  piperacillin-tazobactam (ZOSYN) IVPB 3.375 g (0 g Intravenous Stopped 04/11/16 1903)  vancomycin (VANCOCIN) IVPB 1000 mg/200 mL premix (0 mg Intravenous Stopped 04/11/16 2029)  acetaminophen (TYLENOL) tablet 500 mg (500 mg Oral Given 04/11/16 1852)  sodium chloride 0.9 % bolus 1,000 mL (1,000 mLs Intravenous New Bag/Given 04/11/16 2241)     Initial Impression / Assessment and Plan / ED Course  I have reviewed the triage vital signs and the nursing notes.  Pertinent labs & imaging results that were available during my care of the patient were reviewed by me and considered in my medical decision making (see chart for details).  Clinical Course  Comment By Time  Will call code sepsis. Unknown source, will give vanc/zosyn.  Sherwood Gambler, MD 09/27 1758    Patient's source is probably pneumonia given his hypoxia. Blood pressure is consistently in the 80s although his map is 70. Patient does not appear symptomatic from low blood pressure including no altered mental status, dizziness, or other signs of poor perfusion. Discussed with Dr. Titus Mould of ICU, continue fluids and admit to the hospitalist. If he were to show other signs of shock he would need pressors. Admit to Dr. Hal Hope.  Final Clinical Impressions(s) / ED Diagnoses   Final  diagnoses:  Sepsis [facility])    New Prescriptions New Prescriptions   No medications on file     Sherwood Gambler, MD 04/11/16 [place], MD 04/11/16 [phone]

## 2016-04-11 NOTE — ED Notes (Addendum)
Patient refused in/out cath. Patient states " I'm gonna get you some pee, I just don't know when but you ain't gonna stick that in me to get it ."  Urinal is at bedside at this time.

## 2016-04-11 NOTE — Progress Notes (Signed)
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  PRIOR THERAPY:  1) Concurrent chemoradiation with weekly carboplatin for AUC of 2 and paclitaxel 45 MG/M2. First treatment was given on 10/10/2015. Status post 5 weeks of treatment. Last dose was given 11/14/2015 with partial response. 2) Consolidation systemic chemotherapy with carboplatin for AUC of 5 and paclitaxel 175 MG/M2 every 3 weeks with Neulasta support. First dose 01/25/2016. Status post 3 cycles.  CURRENT THERAPY: None.  INTERVAL HISTORY: Maurice Mcknight 55 y.o. male returns to the clinic today for follow-up visit. The patient is doing fine today with no specific complaints.completed consolidation chemotherapy with reduced dose carboplatin and paclitaxel status post 3 cycles. He is complaining today of fever and chills with temperature of 100.8 and tachycardic with a pulse of 100. He was seen at the emergency department few days ago for a fall and imaging studies of the CT scan of the head and neck showed no concerning findings. He denied having any significant chest pain, shortness of breath, cough or hemoptysis. He has no nausea or vomiting. He had repeat CT scan of the chest performed recently and he is here for evaluation and discussion of his scan results.  MEDICAL HISTORY: Past Medical History:  Diagnosis Date  . Cancer ([place])    Lung Cancer  . Encounter for antineoplastic chemotherapy 10/17/2015  . Malnutrition ([place]) 09/28/2015  . Needs smoking cessation education 09/28/2015  . Radiation 10/17/15-11/25/15   left chest 60 Gy    ALLERGIES:  has No Known Allergies.  MEDICATIONS:  Current Outpatient  Prescriptions  Medication Sig Dispense Refill  . guaiFENesin-dextromethorphan (ROBITUSSIN DM) 100-10 MG/5ML syrup Take 5 mLs by mouth every 4 (four) hours as needed for cough. 118 mL 0  . ibuprofen (ADVIL,MOTRIN) 200 MG tablet Take 800 mg by mouth every 6 (six) hours as needed for moderate pain. Reported on 01/05/2016    . prochlorperazine (COMPAZINE) 10 MG tablet Take 1 tablet (10 mg total) by mouth every 6 (six) hours as needed for nausea or vomiting. 30 tablet 0  . ranitidine (ZANTAC) 150 MG capsule Take 1 capsule (150 mg total) by mouth daily. 30 capsule 0  . sucralfate (CARAFATE) 1 GM/10ML suspension Take 10 mLs (1 g total) by mouth 4 (four) times daily -  with meals and at bedtime. (Patient not taking: Reported on 04/09/2016) 420 mL 0   No current facility-administered medications for this visit.    Facility-Administered Medications Ordered in Other Visits  Medication Dose Route Frequency Provider Last Rate Last Dose  . diphenhydrAMINE (BENADRYL) injection 50 mg  50 mg Intravenous Once Curt Bears, MD        SURGICAL HISTORY:  Past Surgical History:  Procedure Laterality Date  . VIDEO BRONCHOSCOPY Bilateral 09/09/2015   Diagnosis    REVIEW OF SYSTEMS:  Constitutional: positive for anorexia, chills, fatigue and fevers Eyes: negative Ears, nose, mouth, throat, and face: negative Respiratory: positive for cough, dyspnea on exertion and sputum Cardiovascular: negative Gastrointestinal: negative Genitourinary:negative Integument/breast: negative Hematologic/lymphatic: negative Musculoskeletal:negative Neurological: negative Behavioral/Psych: negative Endocrine: negative Allergic/Immunologic: negative   PHYSICAL EXAMINATION: General appearance: alert, cooperative, fatigued and no distress Head: Normocephalic, without obvious abnormality, atraumatic Neck: no adenopathy, no JVD,  supple, symmetrical, trachea midline and thyroid not enlarged, symmetric, no  tenderness/mass/nodules Lymph nodes: Cervical, supraclavicular, and axillary nodes normal. Resp: diminished breath sounds LUL and wheezes bilaterally Back: symmetric, no curvature. ROM normal. No CVA tenderness. Cardio: regular rate and rhythm, S1, S2 normal, no murmur, click, rub or gallop GI: soft, non-tender; bowel sounds normal; no masses,  no organomegaly Extremities: extremities normal, atraumatic, no cyanosis or edema Neurologic: Alert and oriented X 3, normal strength and tone. Normal symmetric reflexes. Normal coordination and gait  ECOG PERFORMANCE STATUS: 1 - Symptomatic but completely ambulatory  Blood pressure 90/71, pulse 99, temperature 99.2 F (37.3 C), temperature source Oral, resp. rate 18, height '6\' 1"'$  (1.854 m), weight 127 lb 1.6 oz (57.7 kg), SpO2 100 %.  LABORATORY DATA: Lab Results  Component Value Date   WBC 4.1 04/09/2016   HGB 9.1 (L) 04/09/2016   HCT 26.9 (L) 04/09/2016   MCV 88.5 04/09/2016   PLT 275 04/09/2016      Chemistry      Component Value Date/Time   NA 133 (L) 04/09/2016 1508   NA 134 (L) 04/04/2016 0944   K 3.8 04/09/2016 1508   K 4.1 04/04/2016 0944   CL 99 (L) 04/09/2016 1508   CO2 25 04/09/2016 1508   CO2 25 04/04/2016 0944   BUN 22 (H) 04/09/2016 1508   BUN 24.1 04/04/2016 0944   CREATININE 0.96 04/09/2016 1508   CREATININE 0.8 04/04/2016 0944      Component Value Date/Time   CALCIUM 8.8 (L) 04/09/2016 1508   CALCIUM 9.1 04/04/2016 0944   ALKPHOS 74 04/09/2016 1508   ALKPHOS 85 04/04/2016 0944   AST 19 04/09/2016 1508   AST 13 04/04/2016 0944   ALT 15 (L) 04/09/2016 1508   ALT 10 04/04/2016 0944   BILITOT 0.4 04/09/2016 1508   BILITOT 0.37 04/04/2016 0944       RADIOGRAPHIC STUDIES: Dg Chest 2 View  Result Date: 04/09/2016 CLINICAL DATA:  Patient status post fall. History of lung cancer. Weakness. EXAM: CHEST  2 VIEW COMPARISON:  Chest radiograph 09/15/2015. FINDINGS: Monitoring leads overlie the patient. Normal  cardiac and mediastinal contours. Left upper lobe perihilar consolidation. Right lung is clear. No pleural effusion or pneumothorax. Tenting of the left hemidiaphragm. Regional skeleton is unremarkable. IMPRESSION: No acute process within the chest. Probable post radiation changes within the left lung. Electronically Signed   By: Lovey Newcomer M.D.   On: 04/09/2016 14:23   Ct Head Wo Contrast  Result Date: 04/09/2016 CLINICAL DATA:  Status post fall, generalized pain EXAM: CT HEAD WITHOUT CONTRAST CT CERVICAL SPINE WITHOUT CONTRAST TECHNIQUE: Multidetector CT imaging of the head and cervical spine was performed following the standard protocol without intravenous contrast. Multiplanar CT image reconstructions of the cervical spine were also generated. COMPARISON:  None. FINDINGS: CT HEAD FINDINGS Brain: No evidence of acute infarction, hemorrhage, hydrocephalus, extra-axial collection or mass lesion/mass effect. Vascular: No hyperdense vessel or unexpected calcification. Skull: No osseous abnormality. Sinuses/Orbits: Visualized paranasal sinuses are clear. Visualized mastoid sinuses are clear. Visualized orbits demonstrate no focal abnormality. Other: None CT CERVICAL SPINE FINDINGS Alignment: Normal. Skull base and vertebrae: No acute fracture. No primary bone lesion or focal pathologic process. Soft tissues and spinal canal: No prevertebral fluid or swelling. No visible canal hematoma. Disc levels: Mild degenerative disc disease at C5-6 with a broad-based disc osteophyte complex impressing on the thecal sac. Upper chest: Negative. Other: No fluid collection or hematoma. IMPRESSION: 1. No acute intracranial pathology. 2.  No acute osseous injury of the cervical spine. 3. Degenerative disc disease and mild broad-based disc osteophyte complex at C5-6. Electronically Signed   By: Kathreen Devoid   On: 04/09/2016 14:36   Ct Chest W Contrast  Result Date: 04/04/2016 CLINICAL DATA:  Left upper lobe lung cancer, status  post chemotherapy and XRT, cough EXAM: CT CHEST WITH CONTRAST TECHNIQUE: Multidetector CT imaging of the chest was performed during intravenous contrast administration. CONTRAST:  52m ISOVUE-300 IOPAMIDOL (ISOVUE-300) INJECTION 61% COMPARISON:  None. FINDINGS: Cardiovascular: Heart is normal in size.  No pericardial effusion. Mild coronary atherosclerosis in the LAD. 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 Mediastinum/Nodes: No suspicious mediastinal lymphadenopathy. Mild soft tissue thickening along the left perihilar region, likely reflecting radiation changes. Visualized thyroid is unremarkable. Mild wall thickening along the mid/distal esophagus (series 2/image 67), likely reflecting radiation changes. Lungs/Pleura: Radiation changes in the left upper lobe, left perihilar region, and superior segment left lower lobe (series 5/image 67), new/progressed from the prior. Associated volume loss in the left lung. No suspicious pulmonary nodules. No focal consolidation.  Mild biapical pleural-parenchymal scarring. No pleural effusion or pneumothorax. Upper Abdomen: Visualized upper abdomen is unremarkable. Musculoskeletal: Visualized osseous structures are within normal limits. IMPRESSION: Scattered radiation changes in the left lung, new/ progressed from the prior. No findings specific for recurrent or metastatic disease. Electronically Signed   By: SJulian HyM.D.   On: 04/04/2016 09:50   Ct Cervical Spine Wo Contrast  Result Date: 04/09/2016 CLINICAL DATA:  Status post fall, generalized pain EXAM: CT HEAD WITHOUT CONTRAST CT CERVICAL SPINE WITHOUT CONTRAST TECHNIQUE: Multidetector CT imaging of the head and cervical spine was performed following the standard protocol without intravenous contrast. Multiplanar CT image reconstructions of the cervical spine were also generated. COMPARISON:  None. FINDINGS: CT HEAD FINDINGS Brain: No evidence of acute infarction, hemorrhage, hydrocephalus,  extra-axial collection or mass lesion/mass effect. Vascular: No hyperdense vessel or unexpected calcification. Skull: No osseous abnormality. Sinuses/Orbits: Visualized paranasal sinuses are clear. Visualized mastoid sinuses are clear. Visualized orbits demonstrate no focal abnormality. Other: None CT CERVICAL SPINE FINDINGS Alignment: Normal. Skull base and vertebrae: No acute fracture. No primary bone lesion or focal pathologic process. Soft tissues and spinal canal: No prevertebral fluid or swelling. No visible canal hematoma. Disc levels: Mild degenerative disc disease at C5-6 with a broad-based disc osteophyte complex impressing on the thecal sac. Upper chest: Negative. Other: No fluid collection or hematoma. IMPRESSION: 1. No acute intracranial pathology. 2. No acute osseous injury of the cervical spine. 3. Degenerative disc disease and mild broad-based disc osteophyte complex at C5-6. Electronically Signed   By: HKathreen Devoid  On: 04/09/2016 14:36    ASSESSMENT AND PLAN: This is a very pleasant 55years old African-American male with questionable as stage IIIa non-small cell lung cancer, squamous cell carcinoma with large obstructing left upper lobe lung mass and questionable mediastinal lymphadenopathy diagnosed in February 2017. The patient completed a course of concurrent chemoradiation with weekly carboplatin and paclitaxel status post 5 weeks of concurrent chemoradiation. He completed radiotherapy on 11/25/2015.  The Rest[place] CT scan of the chest showed significant improvement of his disease. He underwent 3 cycles of consolidation chemotherapy with reduced dose carboplatin and paclitaxel and tolerated his treatment well. The recent CT scan of the chest showed no evidence for disease progression. I discussed the scan results with the patient today and recommended for him to continue on observation with repeat CT scan of the chest in 3  months. For the fever and chills this is questionable for  pneumonia, I will arrange for the patient to receive IV fluid with normal saline in the clinic today in addition to Levaquin 500 MG IV times one. I will also start him on oral Levaquin 500 mg by mouth daily for the next 7 days. If no improvement in his condition with the current treatment, I advised the patient to go immediately to the emergency department for further evaluation. For smoke cessation, I strongly encouraged the patient to quit smoking and offered him a smoke cessation program. He was advised to call immediately if he has any concerning symptoms in the interval. The patient voices understanding of current disease status and treatment options and is in agreement with the current care plan.  All questions were answered. The patient knows to call the clinic with any problems, questions or concerns. We can certainly see the patient much sooner if necessary.  Disclaimer: This note was dictated with voice recognition software. Similar sounding words can inadvertently be transcribed and may not be corrected upon review.

## 2016-04-11 NOTE — ED Notes (Signed)
No respiratory or acute distress noted alert and oriented x 3 call light in reach no reaction to medication noted. 

## 2016-04-11 NOTE — ED Notes (Signed)
No respiratory or acute distress noted alert and oriented x 3 drink given call light in reach no reaction to medication noted.

## 2016-04-11 NOTE — ED Notes (Signed)
Pt cannot use restroom at this time, aware urine specimen is needed.  

## 2016-04-11 NOTE — Progress Notes (Signed)
Pharmacy Antibiotic Note  Maurice Mcknight is a 55 y.o. male admitted on 04/11/2016 with sepsis.  Pharmacy has been consulted for vancomycin/Zosyn dosing. Pt has a PMH of NSCLC and was directed from [place] to ED due a temperature of 103 F. Last dose of chemo (Carboplatin / Paclitaxel q21d Dose Reduction) was on 03/07/16. Pt received one dose of levofloxacin 500 mg IV x1 in cancer center.    Plan:  Vancomycin 1000 mg IV x1, then vancomycin 750 mg IV q12h  Zosyn 3.375 gr IV q8h over 4 hours   Height: '6\' 1"'$  (185.4 cm) Weight: 127 lb (57.6 kg) IBW/kg (Calculated) : 79.9  Temp (24hrs), Avg:101.3 F (38.5 C), Min:99.2 F (37.3 C), Max:103.3 F (39.6 C)   Recent Labs Lab 04/09/16 1508 04/11/16 1819 04/11/16 1837  WBC 4.1 3.9*  --   CREATININE 0.96 0.83  --   LATICACIDVEN  --   --  1.43    Estimated Creatinine Clearance: 80 mL/min (by C-G formula based on SCr of 0.83 mg/dL).    No Known Allergies  Antimicrobials this admission: 9/27 vancomycin >>  9/27 Zosyn >>    Dose adjustments this admission: ----  Microbiology results: 9/27 BCx: sent 9/27 UCx: sent   Thank you for allowing pharmacy to be a part of this patient's care.  Royetta Asal, PharmD, BCPS Pager [phone] 04/11/2016 8:16 PM

## 2016-04-11 NOTE — Progress Notes (Unsigned)
Pt here for levaquin and ivf. Pt iv infiltrated . Pt skin very hot. Temp 103.3 tachypnea , tachycardia, hypotensive and o2 sat 77 room air. Oxygen applied and sat up to 100 % on 2 liters . Pt in NAD. Pt transported to ed via wheelchair. With ivf infusing.

## 2016-04-12 ENCOUNTER — Inpatient Hospital Stay (HOSPITAL_COMMUNITY): Payer: BLUE CROSS/BLUE SHIELD

## 2016-04-12 ENCOUNTER — Encounter (HOSPITAL_COMMUNITY): Payer: Self-pay | Admitting: Radiology

## 2016-04-12 DIAGNOSIS — J189 Pneumonia, unspecified organism: Secondary | ICD-10-CM

## 2016-04-12 DIAGNOSIS — R569 Unspecified convulsions: Secondary | ICD-10-CM

## 2016-04-12 LAB — URINALYSIS, ROUTINE W REFLEX MICROSCOPIC
BILIRUBIN URINE: NEGATIVE
Glucose, UA: NEGATIVE mg/dL
Hgb urine dipstick: NEGATIVE
Ketones, ur: NEGATIVE mg/dL
LEUKOCYTES UA: NEGATIVE
NITRITE: NEGATIVE
PH: 5.5 (ref 5.0–8.0)
Protein, ur: NEGATIVE mg/dL
SPECIFIC GRAVITY, URINE: 1.016 (ref 1.005–1.030)

## 2016-04-12 LAB — IRON AND TIBC
Iron: 28 ug/dL — ABNORMAL LOW (ref 45–182)
SATURATION RATIOS: 17 % — AB (ref 17.9–39.5)
TIBC: 168 ug/dL — AB (ref 250–450)
UIBC: 140 ug/dL

## 2016-04-12 LAB — CBC WITH DIFFERENTIAL/PLATELET
BASOS ABS: 0 10*3/uL (ref 0.0–0.1)
BASOS PCT: 0 %
Eosinophils Absolute: 0 10*3/uL (ref 0.0–0.7)
Eosinophils Relative: 1 %
HEMATOCRIT: 22.7 % — AB (ref 39.0–52.0)
HEMOGLOBIN: 7.7 g/dL — AB (ref 13.0–17.0)
Lymphocytes Relative: 21 %
Lymphs Abs: 0.8 10*3/uL (ref 0.7–4.0)
MCH: 30.2 pg (ref 26.0–34.0)
MCHC: 33.9 g/dL (ref 30.0–36.0)
MCV: 89 fL (ref 78.0–100.0)
MONOS PCT: 15 %
Monocytes Absolute: 0.6 10*3/uL (ref 0.1–1.0)
NEUTROS ABS: 2.6 10*3/uL (ref 1.7–7.7)
NEUTROS PCT: 63 %
Platelets: 212 10*3/uL (ref 150–400)
RBC: 2.55 MIL/uL — AB (ref 4.22–5.81)
RDW: 14.5 % (ref 11.5–15.5)
WBC: 4.1 10*3/uL (ref 4.0–10.5)

## 2016-04-12 LAB — COMPREHENSIVE METABOLIC PANEL
ALBUMIN: 2.6 g/dL — AB (ref 3.5–5.0)
ALK PHOS: 90 U/L (ref 38–126)
ALT: 18 U/L (ref 17–63)
ANION GAP: 9 (ref 5–15)
AST: 36 U/L (ref 15–41)
BILIRUBIN TOTAL: 0.6 mg/dL (ref 0.3–1.2)
BUN: 12 mg/dL (ref 6–20)
CALCIUM: 7.8 mg/dL — AB (ref 8.9–10.3)
CO2: 21 mmol/L — AB (ref 22–32)
CREATININE: 0.73 mg/dL (ref 0.61–1.24)
Chloride: 99 mmol/L — ABNORMAL LOW (ref 101–111)
GFR calc Af Amer: >60 mL/min (ref >60)
GFR calc non Af Amer: >60 mL/min (ref >60)
GLUCOSE: 80 mg/dL (ref 65–99)
Potassium: 3.7 mmol/L (ref 3.5–5.1)
SODIUM: 129 mmol/L — AB (ref 135–145)
TOTAL PROTEIN: 6.9 g/dL (ref 6.5–8.1)

## 2016-04-12 LAB — MRSA PCR SCREENING: MRSA BY PCR: NEGATIVE

## 2016-04-12 LAB — VITAMIN B12: VITAMIN B 12: 700 pg/mL (ref 180–914)

## 2016-04-12 LAB — RETICULOCYTES
RBC.: 2.7 MIL/uL — AB (ref 4.22–5.81)
Retic Count, Absolute: 18.9 10*3/uL — ABNORMAL LOW (ref 19.0–186.0)
Retic Ct Pct: 0.7 % (ref 0.4–3.1)

## 2016-04-12 LAB — FERRITIN: Ferritin: 437 ng/mL — ABNORMAL HIGH (ref 24–336)

## 2016-04-12 LAB — INFLUENZA PANEL BY PCR (TYPE A & B)
H1N1FLUPCR: NOT DETECTED
Influenza A By PCR: NEGATIVE
Influenza B By PCR: NEGATIVE

## 2016-04-12 LAB — MAGNESIUM: MAGNESIUM: 1.6 mg/dL — AB (ref 1.7–2.4)

## 2016-04-12 LAB — TROPONIN I: Troponin I: <0.03 ng/mL (ref <0.03)

## 2016-04-12 LAB — PHOSPHORUS: PHOSPHORUS: 3.4 mg/dL (ref 2.5–4.6)

## 2016-04-12 LAB — FOLATE: FOLATE: 23.4 ng/mL (ref >5.9)

## 2016-04-12 LAB — GLUCOSE, CAPILLARY: Glucose-Capillary: 95 mg/dL (ref 65–99)

## 2016-04-12 LAB — LACTIC ACID, PLASMA: Lactic Acid, Venous: 0.8 mmol/L (ref 0.5–1.9)

## 2016-04-12 MED ORDER — PANTOPRAZOLE SODIUM 40 MG PO TBEC
40.0000 mg | DELAYED_RELEASE_TABLET | Freq: Every day | ORAL | Status: DC
  Administered 2016-04-12 – 2016-04-13 (×2): 40 mg via ORAL
  Filled 2016-04-12 (×2): qty 1

## 2016-04-12 MED ORDER — ALBUTEROL SULFATE (2.5 MG/3ML) 0.083% IN NEBU: 2.5000 mg | INHALATION_SOLUTION | RESPIRATORY_TRACT | Status: DC | PRN

## 2016-04-12 MED ORDER — GUAIFENESIN ER 600 MG PO TB12
600.0000 mg | ORAL_TABLET | Freq: Two times a day (BID) | ORAL | Status: DC
  Administered 2016-04-12 – 2016-04-14 (×5): 600 mg via ORAL
  Filled 2016-04-12 (×6): qty 1

## 2016-04-12 MED ORDER — ORAL CARE MOUTH RINSE
15.0000 mL | Freq: Two times a day (BID) | OROMUCOSAL | Status: DC
  Administered 2016-04-12 – 2016-04-14 (×5): 15 mL via OROMUCOSAL

## 2016-04-12 MED ORDER — HYDROCOD POLST-CPM POLST ER 10-8 MG/5ML PO SUER
5.0000 mL | Freq: Two times a day (BID) | ORAL | Status: DC | PRN
  Administered 2016-04-12 – 2016-04-14 (×2): 5 mL via ORAL
  Filled 2016-04-12 (×2): qty 5

## 2016-04-12 MED ORDER — GADOBENATE DIMEGLUMINE 529 MG/ML IV SOLN
10.0000 mL | Freq: Once | INTRAVENOUS | Status: AC | PRN
  Administered 2016-04-12: 10 mL via INTRAVENOUS

## 2016-04-12 NOTE — Plan of Care (Signed)
Patient's nurse notified me that patient was noticed to have sudden onset of change in mental status with patient moving his eyes and face towards the left side and unresponsive for about 1 minute. There was some bloody discharge from the mouth. On my exam patient is back to his baseline but appears mildly confused. Patient is unable to move his left upper extremity. Pupils are reacting to light. No facial asymmetry.  Assessment -  Patient symptoms are concerning for stroke versus seizures.  Plan - I have discussed with on-call neurologist Dr. Tonny Bollman. Patient will be getting a stat CT head and transferred to [facility] for further management of possible stroke versus seizure. Neurology will be seeing patient in consult at [facility].

## 2016-04-12 NOTE — Procedures (Signed)
Electroencephalogram (EEG) Report  Date of study: 04/12/16  Requesting clinician: Oren Binet, MD  Reason for study: Seizure evaluation  Brief clinical history: This is a 55 year old man who had an episode of decreased responsiveness with left gaze deviation and left head deviation. EEG is now being requested as part of the evaluation.  Medications:  Current Facility-Administered Medications:  .  0.9 %  sodium chloride infusion, , Intravenous, Continuous, Jonetta Osgood, MD, Last Rate: 75 mL/hr at 04/12/16 1333 .  acetaminophen (TYLENOL) tablet 650 mg, 650 mg, Oral, Q6H PRN **OR** acetaminophen (TYLENOL) suppository 650 mg, 650 mg, Rectal, Q6H PRN, Rise Patience, MD .  albuterol (PROVENTIL) (2.5 MG/3ML) 0.083% nebulizer solution 2.5 mg, 2.5 mg, Nebulization, Q2H PRN, Jonetta Osgood, MD .  chlorpheniramine-HYDROcodone (TUSSIONEX) 10-8 MG/5ML suspension 5 mL, 5 mL, Oral, Q12H PRN, Jonetta Osgood, MD, 5 mL at 04/12/16 1315 .  enoxaparin (LOVENOX) injection 40 mg, 40 mg, Subcutaneous, Q24H, Rise Patience, MD, 40 mg at 04/12/16 0053 .  guaiFENesin (MUCINEX) 12 hr tablet 600 mg, 600 mg, Oral, BID, Jonetta Osgood, MD, 600 mg at 04/12/16 1315 .  MEDLINE mouth rinse, 15 mL, Mouth Rinse, BID, Rise Patience, MD, 15 mL at 04/12/16 1315 .  ondansetron (ZOFRAN) tablet 4 mg, 4 mg, Oral, Q6H PRN **OR** ondansetron (ZOFRAN) injection 4 mg, 4 mg, Intravenous, Q6H PRN, Rise Patience, MD .  pantoprazole (PROTONIX) EC tablet 40 mg, 40 mg, Oral, Q1200, Jonetta Osgood, MD, 40 mg at 04/12/16 1315 .  piperacillin-tazobactam (ZOSYN) IVPB 3.375 g, 3.375 g, Intravenous, Q8H, Nikola Glogovac, RPH, Last Rate: 12.5 mL/hr at 04/12/16 1314, 3.375 g at 04/12/16 1314 .  vancomycin (VANCOCIN) IVPB 750 mg/150 ml premix, 750 mg, Intravenous, Q12H, Nikola Glogovac, RPH, 750 mg at 04/12/16 0608  Facility-Administered Medications Ordered in Other Encounters:  .  diphenhydrAMINE (BENADRYL)  injection 50 mg, 50 mg, Intravenous, Once, Curt Bears, MD  Description: This is a routine EEG performed using standard international 10-20 electrode placement. A total of 18 channels are recorded, including one for the EKG.  Activating Maneuvers: None  Findings:  The EKG channel demonstrates are regular rhythm with a rate of about 100 beats per minute.   The background is notable for mild diffuse generalized slowing. Average background frequency is 7-8 hertz. The best dominant posterior rhythm is about 8 Hz and this is better formed on the left side compared to the right. There is mild degree of intermixed theta activity with occasional delta. This background reacts as expected with eye opening.   There is intermixed focal slowing in the right hemisphere, most pronounced in the right frontal and temporal regions. No epileptiform discharges are present. No seizures are recorded.   Drowsiness is recorded and is normal in appearance. Early stages of sleep are recorded. Occasional vertex waves are present, no sleep spindles are recorded.    Impression: This is an abnormal EEG due to mild diffuse slowing. This is nonspecific and may be reflective of drowsiness but must also consider medication effect, sepsis, and postictal state in this patient.  He has focal slowing that is most pronounced in the right frontal and temporal regions. This would be consistent with the described semiology of his spell which consisted of left gaze deviation and left head turn. This would suggest that he his seizure originated in the right hemisphere with this residual slowing suggesting focal cortical dysfunction in that area, possibly  postictal. No active focus is identified given the  absence of epileptiform activity.   Melba Coon, MD [facility]

## 2016-04-12 NOTE — Progress Notes (Signed)
Pt returned from MRI, placed back on monitor.

## 2016-04-12 NOTE — Progress Notes (Signed)
PROGRESS NOTE        PATIENT DETAILS Name: Maurice Mcknight Age: 55 y.o. Sex: male Date of Birth: 05-May-1961 Admit Date: 04/11/2016 Admitting Physician Rise Patience, MD PCP:No PCP Per Patient  Brief Narrative: Patient is a 55 y.o. 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 Initially he was hypotensive, he was thought to have sepsis probably from pneumonia and was empirically started on broad-spectrum antibiotics. He was subsequently admitted to the hospitalist service, on 9/28 morning-he had a coughing spell, following which he became unresponsive-he was noted to have his eyes and head deviated to the left. He was also noted to be incontinent. He slowly regained consciousness in about 10 minutes, he was then noted to have left sided hemiplegia. Patient was then emergently taken for a CT scan of his head- which was negative for intracranial bleed. However patient rapidly improved, and after he completed his CT scan he was noted to be moving all 4 extremities, he was in fact able to move himself to his bed from the CT scan. Neurology was consulted over the phone, patient was then transferred to [facility]. During my evaluation, patient is awake and alert, and does not have any focal neurological deficits. See below for further details.  Subjective: Awake and alert. Coughing  Denies chest pain or shortness of breath. Denies nausea vomiting.  Assessment/Plan: Principal Problem: Sepsis likely secondary to pneumonia: Sepsis pathophysiology is resolving. He is now afebrile, blood pressure is much stable. Continue empiric antibiotics, await culture data.  Active Problems: Suspected convulsive syncope versus seizure: Occurred on 9/28 am-currently with non focal  exam-MRI Brain/EEG ordered. Given description of this morning's event-doubt CVA-d/w Neurology-recommendations are for MRI brain/EEG. If MRI Brain negative for CVA-doubt further work is required. Will await formal evaluation by Neurology  Anemia:? Etiology-has not had any chemotherapy since late August-check anemia panel. No overt evidence of blood loss. May need darbepoetin. He does not appear to be symptomatic with this anemia-up follow for now.  Non-small cell cancer of the lung: Completed chemotherapy/radiation-currently under observation by his neurologist. Most recent CT scan of the chest on 9/20 demonstrated improvement with no recurrent or metastatic disease-it showed some scattered radiation changes to his left lung.  DVT Prophylaxis: Prophylactic Lovenox   Code Status: Full code   Family Communication: None at bedside  Disposition Plan: Remain inpatient-remain in SDU for today  Antimicrobial agents: IV Vanco 9/27>> IV Zosyn 9/27>>  Procedures: None  CONSULTS:  neurology  Time spent: 25 minutes-Greater than 50% of this time was spent in counseling, explanation of diagnosis, planning of further management, and coordination of care.  MEDICATIONS: Anti-infectives    Start     Dose/Rate Route Frequency Ordered Stop   04/12/16 0600  vancomycin (VANCOCIN) IVPB 750 mg/150 ml premix     750 mg 150 mL/hr over 60 Minutes Intravenous Every 12 hours 04/11/16 2018     04/12/16 0000  piperacillin-tazobactam (ZOSYN) IVPB 3.375 g     3.375 g 12.5 mL/hr over 240 Minutes Intravenous Every 8 hours 04/11/16 2018     04/11/16 1800  piperacillin-tazobactam (ZOSYN) IVPB 3.375 g     3.375 g 100 mL/hr over 30  Minutes Intravenous  Once 04/11/16 1758 04/11/16 1903   04/11/16 1800  vancomycin (VANCOCIN) IVPB 1000 mg/200 mL premix     1,000 mg 200 mL/hr over 60 Minutes Intravenous  Once 04/11/16 1758 04/11/16 2029      Scheduled Meds: . enoxaparin (LOVENOX) injection  40 mg Subcutaneous  Q24H  . mouth rinse  15 mL Mouth Rinse BID  . piperacillin-tazobactam (ZOSYN)  IV  3.375 g Intravenous Q8H  . vancomycin  750 mg Intravenous Q12H   Continuous Infusions: . sodium chloride 125 mL/hr at 04/12/16 0021   PRN Meds:.acetaminophen **OR** acetaminophen, guaiFENesin-dextromethorphan, ondansetron **OR** ondansetron (ZOFRAN) IV   PHYSICAL EXAM: Vital signs: Vitals:   04/12/16 0715 04/12/16 0730 04/12/16 0745 04/12/16 0914  BP: 133/72 134/80 111/74 110/78  Pulse: 92 88  81  Resp: (!) 27 (!) 23 (!) 30 (!) 33  Temp:    98.5 F (36.9 C)  TempSrc:    Oral  SpO2: 100% 100%  96%  Weight:    60.1 kg (132 lb 7.9 oz)  Height:    '6\' 1"'$  (1.854 m)   Filed Weights   04/11/16 1822 04/12/16 0227 04/12/16 0914  Weight: 57.6 kg (127 lb) 61.3 kg (135 lb 2.3 oz) 60.1 kg (132 lb 7.9 oz)   Body mass index is 17.48 kg/m.   General appearance :Awake, alert, not in any distress.  Not toxic Looking Eyes:, pupils equally reactive to light and accomodation,no scleral icterus.Pink conjunctiva HEENT: Atraumatic and Normocephalic Neck: supple, no JVD. No cervical lymphadenopathy. No thyromegaly Resp:Good air entry bilaterally, no added sounds  CVS: S1 S2 regular, no murmurs.  GI: Bowel sounds present, Non tender and not distended with no gaurding, rigidity or rebound.No organomegaly Extremities: B/L Lower Ext shows no edema, both legs are warm to touch Neurology:  speech clear,Non focal, sensation is grossly intact. Psychiatric: Normal judgment and insight. Alert and oriented x 3. Normal mood. Musculoskeletal:No digital cyanosis Skin:No Rash, warm and dry Wounds:N/A  I have personally reviewed following labs and imaging studies  LABORATORY DATA: CBC:  Recent Labs Lab 04/09/16 1508 04/11/16 1819 04/11/16 2213 04/12/16 0642  WBC 4.1 3.9* 3.6* 4.1  NEUTROABS 3.4 3.1  --  2.6  HGB 9.1* 8.2* 7.7* 7.7*  HCT 26.9* 23.6* 22.8* 22.7*  MCV 88.5 86.8 89.4 89.0  PLT 275 218 208 212     Basic Metabolic Panel:  Recent Labs Lab 04/09/16 1508 04/11/16 1819 04/11/16 2213 04/12/16 0358  NA 133* 128*  --  129*  K 3.8 3.2*  --  3.7  CL 99* 95*  --  99*  CO2 25 25  --  21*  GLUCOSE 96 94  --  80  BUN 22* 16  --  12  CREATININE 0.96 0.83 0.78 0.73  CALCIUM 8.8* 7.9*  --  7.8*    GFR: Estimated Creatinine Clearance: 86.6 mL/min (by C-G formula based on SCr of 0.73 mg/dL).  Liver Function Tests:  Recent Labs Lab 04/09/16 1508 04/11/16 1819 04/12/16 0358  AST 19 28 36  ALT 15* 18 18  ALKPHOS 74 86 90  BILITOT 0.4 0.6 0.6  PROT 8.6* 7.3 6.9  ALBUMIN 3.2* 2.7* 2.6*   No results for input(s): LIPASE, AMYLASE in the last 168 hours. No results for input(s): AMMONIA in the last 168 hours.  Coagulation Profile: No results for input(s): INR, PROTIME in the last 168 hours.  Cardiac Enzymes:  Recent Labs Lab 04/11/16 2213 04/12/16 0356  TROPONINI <0.03 <0.03  BNP (last 3 results) No results for input(s): PROBNP in the last 8760 hours.  HbA1C: No results for input(s): HGBA1C in the last 72 hours.  CBG:  Recent Labs Lab 04/12/16 0656  GLUCAP 95    Lipid Profile: No results for input(s): CHOL, HDL, LDLCALC, TRIG, CHOLHDL, LDLDIRECT in the last 72 hours.  Thyroid Function Tests: No results for input(s): TSH, T4TOTAL, FREET4, T3FREE, THYROIDAB in the last 72 hours.  Anemia Panel: No results for input(s): VITAMINB12, FOLATE, FERRITIN, TIBC, IRON, RETICCTPCT in the last 72 hours.  Urine analysis:    Component Value Date/Time   COLORURINE YELLOW 04/12/2016 0301   APPEARANCEUR CLEAR 04/12/2016 0301   LABSPEC 1.016 04/12/2016 0301   PHURINE 5.5 04/12/2016 0301   GLUCOSEU NEGATIVE 04/12/2016 0301   HGBUR NEGATIVE 04/12/2016 0301   BILIRUBINUR NEGATIVE 04/12/2016 0301   KETONESUR NEGATIVE 04/12/2016 0301   PROTEINUR NEGATIVE 04/12/2016 0301   NITRITE NEGATIVE 04/12/2016 0301   LEUKOCYTESUR NEGATIVE 04/12/2016 0301    Sepsis  Labs: Lactic Acid, Venous    Component Value Date/Time   LATICACIDVEN 0.8 04/12/2016 0038    MICROBIOLOGY: Recent Results (from the past 240 hour(s))  MRSA PCR Screening     Status: None   Collection Time: 04/12/16  3:03 AM  Result Value Ref Range Status   MRSA by PCR NEGATIVE NEGATIVE Final    Comment:        The GeneXpert MRSA Assay (FDA approved for NASAL specimens only), is one component of a comprehensive MRSA colonization surveillance program. It is not intended to diagnose MRSA infection nor to guide or monitor treatment for MRSA infections.     RADIOLOGY STUDIES/RESULTS: Dg Chest 2 View  Result Date: 04/09/2016 CLINICAL DATA:  Patient status post fall. History of lung cancer. Weakness. EXAM: CHEST  2 VIEW COMPARISON:  Chest radiograph 09/15/2015. FINDINGS: Monitoring leads overlie the patient. Normal cardiac and mediastinal contours. Left upper lobe perihilar consolidation. Right lung is clear. No pleural effusion or pneumothorax. Tenting of the left hemidiaphragm. Regional skeleton is unremarkable. IMPRESSION: No acute process within the chest. Probable post radiation changes within the left lung. Electronically Signed   By: Lovey Newcomer M.D.   On: 04/09/2016 14:23   Ct Head Wo Contrast  Result Date: 04/12/2016 CLINICAL DATA:  Weakness of left upper extremity. EXAM: CT HEAD WITHOUT CONTRAST TECHNIQUE: Contiguous axial images were obtained from the base of the skull through the vertex without intravenous contrast. COMPARISON:  Three days ago FINDINGS: Brain: No evidence of acute infarction, hemorrhage, hydrocephalus, extra-axial collection or mass lesion/mass effect. Mild white matter disease is underestimated compared to 09/13/2015 brain MRI. If seizure is being considered there is no cortical finding to explain seizure. Vascular: No hyperdense vessel or unexpected calcification. Skull: Normal. Negative for fracture or focal lesion. Sinuses/Orbits: No acute finding. Other:  These results were called by telephone at the time of interpretation on 04/12/2016 at 7:41 am to ICU RN Jeannene Patella, who verbally acknowledged these results. IMPRESSION: Stable exam.  No acute intracranial finding. Electronically Signed   By: Monte Fantasia M.D.   On: 04/12/2016 07:47   Ct Head Wo Contrast  Result Date: 04/09/2016 CLINICAL DATA:  Status post fall, generalized pain EXAM: CT HEAD WITHOUT CONTRAST CT CERVICAL SPINE WITHOUT CONTRAST TECHNIQUE: Multidetector CT imaging of the head and cervical spine was performed following the standard protocol without intravenous contrast. Multiplanar CT image reconstructions of the cervical spine were also generated. COMPARISON:  None. FINDINGS: CT HEAD FINDINGS Brain: No evidence  of acute infarction, hemorrhage, hydrocephalus, extra-axial collection or mass lesion/mass effect. Vascular: No hyperdense vessel or unexpected calcification. Skull: No osseous abnormality. Sinuses/Orbits: Visualized paranasal sinuses are clear. Visualized mastoid sinuses are clear. Visualized orbits demonstrate no focal abnormality. Other: None CT CERVICAL SPINE FINDINGS Alignment: Normal. Skull base and vertebrae: No acute fracture. No primary bone lesion or focal pathologic process. Soft tissues and spinal canal: No prevertebral fluid or swelling. No visible canal hematoma. Disc levels: Mild degenerative disc disease at C5-6 with a broad-based disc osteophyte complex impressing on the thecal sac. Upper chest: Negative. Other: No fluid collection or hematoma. IMPRESSION: 1. No acute intracranial pathology. 2. No acute osseous injury of the cervical spine. 3. Degenerative disc disease and mild broad-based disc osteophyte complex at C5-6. Electronically Signed   By: Kathreen Devoid   On: 04/09/2016 14:36   Ct Chest W Contrast  Result Date: 04/04/2016 CLINICAL DATA:  Left upper lobe lung cancer, status post chemotherapy and XRT, cough EXAM: CT CHEST WITH CONTRAST TECHNIQUE: Multidetector CT  imaging of the chest was performed during intravenous contrast administration. CONTRAST:  34m ISOVUE-300 IOPAMIDOL (ISOVUE-300) INJECTION 61% COMPARISON:  None. FINDINGS: Cardiovascular: Heart is normal in size.  No pericardial effusion. Mild coronary atherosclerosis in the LAD. 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 Mediastinum/Nodes: No suspicious mediastinal lymphadenopathy. Mild soft tissue thickening along the left perihilar region, likely reflecting radiation changes. Visualized thyroid is unremarkable. Mild wall thickening along the mid/distal esophagus (series 2/image 67), likely reflecting radiation changes. Lungs/Pleura: Radiation changes in the left upper lobe, left perihilar region, and superior segment left lower lobe (series 5/image 67), new/progressed from the prior. Associated volume loss in the left lung. No suspicious pulmonary nodules. No focal consolidation.  Mild biapical pleural-parenchymal scarring. No pleural effusion or pneumothorax. Upper Abdomen: Visualized upper abdomen is unremarkable. Musculoskeletal: Visualized osseous structures are within normal limits. IMPRESSION: Scattered radiation changes in the left lung, new/ progressed from the prior. No findings specific for recurrent or metastatic disease. Electronically Signed   By: SJulian HyM.D.   On: 04/04/2016 09:50   Ct Cervical Spine Wo Contrast  Result Date: 04/09/2016 CLINICAL DATA:  Status post fall, generalized pain EXAM: CT HEAD WITHOUT CONTRAST CT CERVICAL SPINE WITHOUT CONTRAST TECHNIQUE: Multidetector CT imaging of the head and cervical spine was performed following the standard protocol without intravenous contrast. Multiplanar CT image reconstructions of the cervical spine were also generated. COMPARISON:  None. FINDINGS: CT HEAD FINDINGS Brain: No evidence of acute infarction, hemorrhage, hydrocephalus, extra-axial collection or mass lesion/mass effect. Vascular: No hyperdense vessel or  unexpected calcification. Skull: No osseous abnormality. Sinuses/Orbits: Visualized paranasal sinuses are clear. Visualized mastoid sinuses are clear. Visualized orbits demonstrate no focal abnormality. Other: None CT CERVICAL SPINE FINDINGS Alignment: Normal. Skull base and vertebrae: No acute fracture. No primary bone lesion or focal pathologic process. Soft tissues and spinal canal: No prevertebral fluid or swelling. No visible canal hematoma. Disc levels: Mild degenerative disc disease at C5-6 with a broad-based disc osteophyte complex impressing on the thecal sac. Upper chest: Negative. Other: No fluid collection or hematoma. IMPRESSION: 1. No acute intracranial pathology. 2. No acute osseous injury of the cervical spine. 3. Degenerative disc disease and mild broad-based disc osteophyte complex at C5-6. Electronically Signed   By: HKathreen Devoid  On: 04/09/2016 14:36   Dg Chest Portable 1 View  Result Date: 04/11/2016 CLINICAL DATA:  Acute onset of chills, fever and high blood pressure. Sepsis. Initial encounter. EXAM: PORTABLE CHEST  1 VIEW COMPARISON:  Chest radiograph from 09/25 04/09/2016, and CT of the chest performed 04/04/2016 FINDINGS: Left perihilar postradiation changes are again noted, relatively similar to the prior study. No definite acute focal airspace consolidation is seen, though it cannot be entirely excluded. No pleural effusion or pneumothorax identified. The cardiomediastinal silhouette is normal in size. No acute osseous abnormalities are seen. IMPRESSION: Stable appearance to left perihilar postradiation changes. No definite acute focal airspace consolidation seen, though it cannot be entirely excluded. Electronically Signed   By: Garald Balding M.D.   On: 04/11/2016 19:11     LOS: 1 day   Oren Binet, MD  [facility] Pager:[phone]  If 7PM-7AM, please contact night-coverage [email or web address] Password [facility] 04/12/2016, 9:31 AM

## 2016-04-12 NOTE — Progress Notes (Signed)
This Probation officer arrived on duty for Charge report overheard night Charge RN calling out to this patient, "Maurice Mcknight are you okay". I arrived to room to find patient supine in bed, alert but unable to speak. Within 5 minutes of this writer's arrival able to  follow commands such as raising eyebrows, sticking tongue out, and squeezing with right hand, however, appeared dazed. He was unable to speak, and squeeze with left hand or lift left leg. He had been incontinent of urine. Taken to CT scan at which time patient was able to bend left arm across chest without command. CT done and patient helped transfer self back to bed. Brought back to room and [facility] called, arriving shortly after.

## 2016-04-12 NOTE — Progress Notes (Signed)
Routine EEG completed, results pending. 

## 2016-04-12 NOTE — Care Management Note (Signed)
Case Management Note  Patient Details  Name: Maurice Mcknight MRN: [id number] Date of Birth: [birth year]  Subjective/Objective:  Presents with sepsis secondary to pna, snycope vs seizure, anemia, patient lives alone,  NCM will cont to follow for dc needs.                   Action/Plan:   Expected Discharge Date:   (unknown)               Expected Discharge Plan:  [place]  In-House Referral:     Discharge planning Services  CM Consult  Post Acute Care Choice:    Choice offered to:     DME Arranged:    DME Agency:     HH Arranged:    HH Agency:     Status of Service:  In process, will continue to follow  If discussed at Long Length of Stay Meetings, dates discussed:    Additional Comments:  Zenon Mayo, RN 04/12/2016, 5:49 PM

## 2016-04-12 NOTE — Consult Note (Signed)
Neurology Consult Note  Reason for Consultation: Seizure vs stroke  Requesting provider: Gean Birchwood, MD  CC: "I'm cold"  HPI: This is a 74-yo RH man with stage IIIA non-small cell lung CA who was admitted to [facility] on 04/11/16 after developing fever, chills, and cough. On presentation he was hypotensive and he was placed in the ICU for presumed sepsis with a presumed source of pneumonia. He was started on vancomycin and Zosyn. This morning at 0645 he was observed to have an episode of coughing after which he became unresponsive and his head turned to the left with left eye deviation. He had urinary incontinence during this episode as well. There is no mention of any abnormal motor activity. RN note states that after 1-2 minutes his head and gaze relaxed but he remained unresponsive. Over the next ten minutes, he started making eye contact and following commands though he appeared dazed. He apparently was not moving his left arm or leg initially. STAT CT head was ordered and RN states that by the time he arrived in CT he was moving his left side again and was able to move himself back to his bed after the scan. He was transferred to [facility] due to concern for possible stroke and neurology consultation is now requested.   The patient has little recollection of this event today. He feels like he is at his normal baseline right now. He says that he thinks he could not move his left arm because that happens sometimes when he lays on his side and goes away when he repositions himself. He denies any history of seizures. He has no reported history of stroke, TBI, or CNS infection. He has no known CNS metastases from his lung CA. There is no family history of seizure or epilepsy. He is receiving carboplatin and paclitaxel with concurrent XRT for his CA.   PMH:  Past Medical History:  Diagnosis Date  . Cancer ([place])    Lung Cancer  . Encounter for antineoplastic chemotherapy 10/17/2015  .  Fever and chills 04/11/2016  . Malnutrition ([place]) 09/28/2015  . Needs smoking cessation education 09/28/2015  . Radiation 10/17/15-11/25/15   left chest 60 Gy    PSH:  Past Surgical History:  Procedure Laterality Date  . VIDEO BRONCHOSCOPY Bilateral 09/09/2015   Diagnosis    Family history: Family History  Problem Relation Age of Onset  . Cancer Sister     Social history:  Social History   Social History  . Marital status: Single    Spouse name: N/A  . Number of children: N/A  . Years of education: N/A   Occupational History  . Not on file.   Social History Main Topics  . Smoking status: Current Some Day Smoker    Packs/day: 0.10    Types: Cigarettes    Start date: 10/05/1985  . Smokeless tobacco: Never Used  . Alcohol use No  . Drug use: No  . Sexual activity: Not Currently   Other Topics Concern  . Not on file   Social History Narrative  . No narrative on file    Current outpatient meds: Current Meds  Medication Sig  . guaiFENesin-dextromethorphan (ROBITUSSIN DM) 100-10 MG/5ML syrup Take 5 mLs by mouth every 4 (four) hours as needed for cough.  Marland Kitchen ibuprofen (ADVIL,MOTRIN) 200 MG tablet Take 800 mg by mouth every 6 (six) hours as needed for moderate pain. Reported on 01/05/2016  . naproxen sodium (ANAPROX) 220 MG tablet Take 220-440 mg  by mouth every 12 (twelve) hours as needed (for pain).  . prochlorperazine (COMPAZINE) 10 MG tablet Take 1 tablet (10 mg total) by mouth every 6 (six) hours as needed for nausea or vomiting.    Current inpatient meds:  Current Facility-Administered Medications  Medication Dose Route Frequency Provider Last Rate Last Dose  . 0.9 %  sodium chloride infusion   Intravenous Continuous Jonetta Osgood, MD 125 mL/hr at 04/12/16 0021    . acetaminophen (TYLENOL) tablet 650 mg  650 mg Oral Q6H PRN Rise Patience, MD       Or  . acetaminophen (TYLENOL) suppository 650 mg  650 mg Rectal Q6H PRN Rise Patience, MD      .  albuterol (PROVENTIL) (2.5 MG/3ML) 0.083% nebulizer solution 2.5 mg  2.5 mg Nebulization Q2H PRN Shanker Kristeen Mans, MD      . chlorpheniramine-HYDROcodone (TUSSIONEX) 10-8 MG/5ML suspension 5 mL  5 mL Oral Q12H PRN Jonetta Osgood, MD      . enoxaparin (LOVENOX) injection 40 mg  40 mg Subcutaneous Q24H Rise Patience, MD   40 mg at 04/12/16 0053  . guaiFENesin (MUCINEX) 12 hr tablet 600 mg  600 mg Oral BID Jonetta Osgood, MD      . MEDLINE mouth rinse  15 mL Mouth Rinse BID Rise Patience, MD      . ondansetron [facility]) tablet 4 mg  4 mg Oral Q6H PRN Rise Patience, MD       Or  . ondansetron [facility]) injection 4 mg  4 mg Intravenous Q6H PRN Rise Patience, MD      . pantoprazole (PROTONIX) EC tablet 40 mg  40 mg Oral Q1200 Jonetta Osgood, MD      . piperacillin-tazobactam (ZOSYN) IVPB 3.375 g  3.375 g Intravenous Q8H Royetta Asal, RPH   Stopped at 04/12/16 0054  . vancomycin (VANCOCIN) IVPB 750 mg/150 ml premix  750 mg Intravenous Q12H Royetta Asal, RPH   750 mg at 04/12/16 0608   Facility-Administered Medications Ordered in Other Encounters  Medication Dose Route Frequency Provider Last Rate Last Dose  . diphenhydrAMINE (BENADRYL) injection 50 mg  50 mg Intravenous Once Curt Bears, MD        Allergies: No Known Allergies  ROS: As per HPI. A full 14-point review of systems was performed and his only complaint is that he is cold.   PE:  BP 110/78   Pulse 81   Temp 98.5 F (36.9 C) (Oral)   Resp (!) 33   Ht '6\' 1"'$  (1.854 m)   Wt 60.1 kg (132 lb 7.9 oz)   SpO2 96%   BMI 17.48 kg/m   General: WDWN, no acute distress. AAO x4. Speech clear, no dysarthria. No aphasia. Follows commands briskly. Affect is bright with congruent mood. Comportment is normal.  HEENT: Normocephalic. Neck supple without LAD. MMM, OP clear. Dentition good. Sclerae anicteric. No conjunctival injection.  CV: Regular, no murmur. Carotid pulses full and symmetric, no bruits.  Distal pulses 2+ and symmetric.  Lungs: CTAB.  Abdomen: Soft, non-distended, non-tender. Bowel sounds present x4.  Extremities: No C/C/E. Neuro:  CN: Pupils are equal and round. They are symmetrically reactive from 3-->2 mm. EOMI without nystagmus. No reported diplopia. Facial sensation is intact to light touch. Face is symmetric at rest with normal strength and mobility. Hearing is intact to conversational voice. Palate elevates symmetrically and uvula is midline. Voice is normal in tone, pitch and quality. Bilateral SCM  and trapezii are 5/5. Tongue is midline with normal bulk and mobility.  Motor: Normal bulk, tone, and strength. No tremor or other abnormal movements.  Sensation: Intact to light touch.  DTRs: 2+, symmetric. Toes downgoing bilaterally. No pathologic reflexes.  Coordination: Finger-to-nose is without dysmetria.    Labs:  Lab Results  Component Value Date   WBC 4.1 04/12/2016   HGB 7.7 (L) 04/12/2016   HCT 22.7 (L) 04/12/2016   PLT 212 04/12/2016   GLUCOSE 80 04/12/2016   ALT 18 04/12/2016   AST 36 04/12/2016   NA 129 (L) 04/12/2016   K 3.7 04/12/2016   CL 99 (L) 04/12/2016   CREATININE 0.73 04/12/2016   BUN 12 04/12/2016   CO2 21 (L) 04/12/2016   INR 1.14 09/09/2015    Lactate 0.8 procalcitonin 8.83 UA negative  Troponin-I <0.03  Imaging:  I have personally and independently reviewed the [facility] of the head without contrast from today. No acute abnormality is seen. There is mild chronic small vessel ischemic disease. This is unchanged from a prior scan on 04/09/16.    Assessment and Plan:  1. Seizure: This represents a first-time seizure, likely provoked in the setting of sepsis and mild hyponatremia. His spell this morning is concerning for possible partial seizure originating in the right cerebral hemisphere given versive head turn to the left with left gaze deviation and unresponsiveness with postictal confusion. This was immediately preceded by coughing so  syncope is also a consideration, but the focality of the semiology is more suggestive of seizure. He is hyponatremic with sodium 129 this morning--seizures are not usually seen with this degree of hyponatremia but this is a possible precipitant. Check EEG and MRI brain with and without contrast. I will check serum magnesium and phosphorus levels. This is not consistent with stroke so no need for stroke workup. I will defer AED therapy for now pending results of EEG and MRI. Correct hyponatremia. Seizure precautions.   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 No residual at this time. Follow.   This was discussed with the patient and he is in agreement with the plan as noted. He was given the chance to ask any questions and these were addressed to his satisfaction.   I also discussed by recommendations with Dr. Sloan Leiter at the time of my consultation.

## 2016-04-12 NOTE — Progress Notes (Signed)
Patient had been sleeping and was awake for lab draw. Per Lab tech he was discussing his breakfast, was alert and appropriate.  This occurred 0645. He began coughing (which has been a chronic problem). Upon entering his room blood was noted on his bed linen. Patient had labored respirations.  Patient was unresponsive, with head and eyes deviated to the left. Within 1-2 minutes his head and gaze relaxed but continued to be unresponsive with Pupils 4 and sluggish. Time approx. 0648.  Within 10 minutes he began to make eye contact and gradually was able to follow commands with no movement of left arm and left leg.He was incontinent. MD had been paged to his room. Patient initially appeared dazed. CT head ordered. Patient more alert and following commands prior to going to CT. Upon arrival to CT he was able to move his left arm and leg.  He was able to move himself to the bed after his CT scan.

## 2016-04-13 ENCOUNTER — Inpatient Hospital Stay (HOSPITAL_COMMUNITY): Payer: BLUE CROSS/BLUE SHIELD

## 2016-04-13 DIAGNOSIS — C3412 Malignant neoplasm of upper lobe, left bronchus or lung: Secondary | ICD-10-CM

## 2016-04-13 LAB — GLUCOSE, CAPILLARY: GLUCOSE-CAPILLARY: 97 mg/dL (ref 65–99)

## 2016-04-13 LAB — BASIC METABOLIC PANEL
ANION GAP: 4 — AB (ref 5–15)
BUN: 8 mg/dL (ref 6–20)
CALCIUM: 7.8 mg/dL — AB (ref 8.9–10.3)
CO2: 26 mmol/L (ref 22–32)
CREATININE: 0.73 mg/dL (ref 0.61–1.24)
Chloride: 101 mmol/L (ref 101–111)
Glucose, Bld: 101 mg/dL — ABNORMAL HIGH (ref 65–99)
Potassium: 3.2 mmol/L — ABNORMAL LOW (ref 3.5–5.1)
SODIUM: 131 mmol/L — AB (ref 135–145)

## 2016-04-13 LAB — URINE CULTURE: CULTURE: NO GROWTH

## 2016-04-13 LAB — CBC
HCT: 22.4 % — ABNORMAL LOW (ref 39.0–52.0)
Hemoglobin: 7.4 g/dL — ABNORMAL LOW (ref 13.0–17.0)
MCH: 29.7 pg (ref 26.0–34.0)
MCHC: 33 g/dL (ref 30.0–36.0)
MCV: 90 fL (ref 78.0–100.0)
PLATELETS: 257 10*3/uL (ref 150–400)
RBC: 2.49 MIL/uL — ABNORMAL LOW (ref 4.22–5.81)
RDW: 14.3 % (ref 11.5–15.5)
WBC: 4 10*3/uL (ref 4.0–10.5)

## 2016-04-13 MED ORDER — POTASSIUM CHLORIDE CRYS ER 20 MEQ PO TBCR: 40.0000 meq | EXTENDED_RELEASE_TABLET | Freq: Once | ORAL | Status: DC

## 2016-04-13 NOTE — Evaluation (Signed)
Clinical/Bedside Swallow Evaluation Patient Details  Name: Maurice FEENY MRN: [id number] Date of Birth: [birth year]  Today's Date: 04/13/2016 Time: SLP Start Time (ACUTE ONLY): 1051 SLP Stop Time (ACUTE ONLY): 1108 SLP Time Calculation (min) (ACUTE ONLY): 17 min  Past Medical History:  Past Medical History:  Diagnosis Date  . Cancer ([place])    Lung Cancer  . Encounter for antineoplastic chemotherapy 10/17/2015  . Fever and chills 04/11/2016  . Malnutrition ([place]) 09/28/2015  . Needs smoking cessation education 09/28/2015  . Radiation 10/17/15-11/25/15   left chest 60 Gy   Past Surgical History:  Past Surgical History:  Procedure Laterality Date  . VIDEO BRONCHOSCOPY Bilateral 09/09/2015   Diagnosis   HPI:  55 y.o.malewith history of stage IIIa non-small cell cancer of the lung who completed concurrent chemoradiation on 11/25/15, currently under observation by his oncologist, admitted on 9/27 for evaluation of fever, chills, cough and a fall on 9/25. Initially he was hypotensive, he was thought to have sepsis probably from pneumonia and was empirically started on broad-spectrum antibiotics. On 9/28 morning-he had a coughing spell, following which he became unresponsive with seizure like activity and transient left sided weakness. Urgent CT head was negative, patient was transferred to [facility] for Neurology evaluation. Pt reported coughing after eating meals, therefore SLP swallow evaluation was ordered.   Assessment / Plan / Recommendation Clinical Impression  Pt appears to have a swift oropharyngeal swallow, yet after several bites of solids were consumed he started to exhibit delayed coughing. Pt denies any globus sensation or difficulty swallowing, but states that he has been having coughing episodes that occur after PO intake over the past year or more. Question possible esophageal component - MD has ordered an esophagram. SLP reviewed aspiration and esophageal precautions with pt. Would continue  with current diet for now, but SLP will f/u pending imaging results to determine plan moving forward.    Aspiration Risk  Mild aspiration risk    Diet Recommendation Regular;Thin liquid   Liquid Administration via: Cup;Straw Medication Administration: Whole meds with liquid Supervision: Patient able to self feed;Intermittent supervision to cue for compensatory strategies Compensations: Slow rate;Small sips/bites;Follow solids with liquid Postural Changes: Seated upright at 90 degrees;Remain upright for at least 30 minutes after po intake    Other  Recommendations Oral Care Recommendations: Oral care BID   Follow up Recommendations  (tba)      Frequency and Duration            Prognosis        Swallow Study   General HPI: 55 y.o.malewith history of stage IIIa non-small cell cancer of the lung who completed concurrent chemoradiation on 11/25/15, currently under observation by his oncologist, admitted on 9/27 for evaluation of fever, chills, cough and a fall on 9/25. Initially he was hypotensive, he was thought to have sepsis probably from pneumonia and was empirically started on broad-spectrum antibiotics. On 9/28 morning-he had a coughing spell, following which he became unresponsive with seizure like activity and transient left sided weakness. Urgent CT head was negative, patient was transferred to [facility] for Neurology evaluation. Pt reported coughing after eating meals, therefore SLP swallow evaluation was ordered. Type of Study: Bedside Swallow Evaluation Previous Swallow Assessment: none in chart Diet Prior to this Study: Regular;Thin liquids Temperature Spikes Noted: Yes (100.3) Respiratory Status: Room air History of Recent Intubation: No Behavior/Cognition: Alert;Cooperative;Requires cueing Oral Cavity Assessment: Within Functional Limits Oral Care Completed by SLP: No Oral Cavity - Dentition: Missing dentition Vision: Functional for  self-feeding Self-Feeding Abilities:  Able to feed self Patient Positioning: Upright in bed Baseline Vocal Quality: Normal    Oral/Motor/Sensory Function Overall Oral Motor/Sensory Function: Within functional limits   Ice Chips Ice chips: Not tested   Thin Liquid Thin Liquid: Within functional limits Presentation: Cup;Self Fed;Straw    Nectar Thick Nectar Thick Liquid: Not tested   Honey Thick Honey Thick Liquid: Not tested   Puree Puree: Within functional limits Presentation: Self Fed;Spoon   Solid   GO   Solid: Impaired Presentation: Self Fed Pharyngeal Phase Impairments: Cough - Delayed        Germain Osgood 04/13/2016,11:22 AM  Germain Osgood, M.A. CCC-SLP [phone]

## 2016-04-13 NOTE — Progress Notes (Signed)
Pharmacy Antibiotic Note  Maurice Mcknight is a 55 y.o. male admitted on 04/11/2016 with sepsis.  Pharmacy has been consulted for Zosyn dosing for PNA. Pt has a PMH of NSCLC and was directed from [place] to ED due a temperature of 103 F. Last dose of chemo (Carboplatin / Paclitaxel q21d Dose Reduction) was on 03/07/16.  WBC= 4.0, tmax= 100.3   Plan: Zosyn 3.375 gr IV q8h over 4 hours  Consider streamline to levaquin as able? Will follow patient progress  Height: '6\' 1"'$  (185.4 cm) Weight: 132 lb 7.9 oz (60.1 kg) IBW/kg (Calculated) : 79.9  Temp (24hrs), Avg:99.3 F (37.4 C), Min:98.8 F (37.1 C), Max:100.3 F (37.9 C)   Recent Labs Lab 04/09/16 1508 04/11/16 1819 04/11/16 1837 04/11/16 2213 04/12/16 0038 04/12/16 0358 04/12/16 0642 04/13/16 0305  WBC 4.1 3.9*  --  3.6*  --   --  4.1 4.0  CREATININE 0.96 0.83  --  0.78  --  0.73  --  0.73  LATICACIDVEN  --   --  1.43 0.8 0.8  --   --   --     Estimated Creatinine Clearance: 86.6 mL/min (by C-G formula based on SCr of 0.73 mg/dL).    No Known Allergies  Antimicrobials this admission: 9/27 vancomycin >> 9.29 9/27 Zosyn >>    Dose adjustments this admission: ----  Microbiology results: 9/27 BCx: ngtd 9/27 UCx: neg 9/28 MRSA - neg  Thank you for allowing pharmacy to be a part of this patient's care.  Hildred Laser, Pharm D 04/13/2016 12:41 PM

## 2016-04-13 NOTE — Progress Notes (Addendum)
PROGRESS NOTE        PATIENT DETAILS Name: Maurice Mcknight Age: 55 y.o. Sex: male Date of Birth: 1960-12-15 Admit Date: 04/11/2016 Admitting Physician Rise Patience, MD PCP:No PCP Per Patient  Brief Narrative: Patient is a 55 y.o. 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 Initially he was hypotensive, he was thought to have sepsis probably from pneumonia and was empirically started on broad-spectrum antibiotics. He was subsequently admitted to the hospitalist service, on 9/28 morning-he had a coughing spell, following which he became unresponsive with seizure like activity and transient left sided weakness. Urgent CT head was negative, patient was transferred to [facility] for Neurology evaluation.  Subjective: Awake and alert. Coughs every time he eats!  Feels better than on initial presentation. No fever overnight   Assessment/Plan: Principal Problem: Sepsis likely secondary to pneumonia: Sepsis pathophysiology has resolved.Afebrile overnight, blood pressure stable. Influenza PCR negative. Blood cultures negative so far-MRSA PCR also negative-stop vancomycin on 9/29, continue empiric Zosyn. Since he continues to cough-especially after eating-have ordered SLP evaluation  Addendum 11:20 3 AM Discussed with SLP-probable esophageal problem-have ordered a barium esophagogram. May need GI consultation at some point.  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 MRI brain negative for CVA/metastases-although EEG did not show any epileptiform activity-it is highly suggestive of recent seizure-like activity. Spoke with  neurology-Dr. Oster-recommends to continue to monitor without any antiepileptic therapy at this time. If he were to have a second seizure, patient will then be started on antiepileptics.  Anemia: Suspect secondary to chronic illness-no overt evidence of blood loss. Anemia panel with elevated ferritin-highly suggestive of chronic disease. Appears to be asymptomatic-we'll transfuse if further drop in hemoglobin.  Hyponatremia: Very mild, stable for continued monitoring without any need for further workup at this time. Could have SIADH due to underlying malignancy/pneumonia.  Hypokalemia: Replete and recheck.  Non-small cell cancer of the lung: Completed chemotherapy/radiation-currently under observation by his Oncologist. Most recent CT scan of the chest on 9/20 demonstrated improvement with no recurrent or metastatic disease-it showed some scattered radiation changes to his left lung.  DVT Prophylaxis: Prophylactic Lovenox   Code Status: Full code   Family Communication: Maurice Mcknight at bedside  Disposition Plan: Remain inpatient-transfer to neuro telemetry-suspect home in the next few days  Antimicrobial agents: IV Vanco 9/27>>9/29 IV Zosyn 9/27>>  Procedures: Maurice Mcknight  CONSULTS:  neurology  Time spent: 25 minutes-Greater than 50% of this time was spent in counseling, explanation of diagnosis, planning of further management, and coordination of care.  MEDICATIONS: Anti-infectives    Start     Dose/Rate Route Frequency Ordered Stop   04/12/16 0600  vancomycin (VANCOCIN) IVPB 750 mg/150 ml premix     750 mg 150 mL/hr over 60 Minutes Intravenous Every 12 hours 04/11/16 2018     04/12/16 0000  piperacillin-tazobactam (ZOSYN) IVPB 3.375 g     3.375 g 12.5 mL/hr over 240 Minutes Intravenous Every 8 hours 04/11/16 2018     04/11/16 1800  piperacillin-tazobactam (ZOSYN) IVPB 3.375 g     3.375 g 100 mL/hr over 30 Minutes  Intravenous  Once 04/11/16 1758 04/11/16 1903   04/11/16 1800   vancomycin (VANCOCIN) IVPB 1000 mg/200 mL premix     1,000 mg 200 mL/hr over 60 Minutes Intravenous  Once 04/11/16 1758 04/11/16 2029      Scheduled Meds: . enoxaparin (LOVENOX) injection  40 mg Subcutaneous Q24H  . guaiFENesin  600 mg Oral BID  . mouth rinse  15 mL Mouth Rinse BID  . pantoprazole  40 mg Oral Q1200  . piperacillin-tazobactam (ZOSYN)  IV  3.375 g Intravenous Q8H  . potassium chloride  40 mEq Oral Once  . vancomycin  750 mg Intravenous Q12H   Continuous Infusions:   PRN Meds:.acetaminophen **OR** acetaminophen, albuterol, chlorpheniramine-HYDROcodone, ondansetron **OR** ondansetron (ZOFRAN) IV   PHYSICAL EXAM: Vital signs: Vitals:   04/12/16 2322 04/12/16 2346 04/13/16 0330 04/13/16 0700  BP: 92/66   92/65  Pulse: 81   74  Resp: (!) 23   (!) 26  Temp:  99.5 F (37.5 C) 99.3 F (37.4 C) 99 F (37.2 C)  TempSrc:  Oral Oral Oral  SpO2: 100%     Weight:      Height:       Filed Weights   04/11/16 1822 04/12/16 0227 04/12/16 0914  Weight: 57.6 kg (127 lb) 61.3 kg (135 lb 2.3 oz) 60.1 kg (132 lb 7.9 oz)   Body mass index is 17.48 kg/m.   General appearance :Awake, alert, not in any distress.  Not toxic Looking Eyes:, pupils equally reactive to light and accomodation,no scleral icterus.Pink conjunctiva HEENT: Atraumatic and Normocephalic Neck: supple, no JVD. No cervical lymphadenopathy. No thyromegaly Resp:Good air entry bilaterally, no added sounds  CVS: S1 S2 regular, no murmurs.  GI: Bowel sounds present, Non tender and not distended with no gaurding, rigidity or rebound.No organomegaly Extremities: B/L Lower Ext shows no edema, both legs are warm to touch Neurology:  speech clear,Non focal, sensation is grossly intact. Psychiatric: Normal judgment and insight. Alert and oriented x 3. Normal mood. Musculoskeletal:No digital cyanosis Skin:No Rash, warm and dry Wounds:N/A  I have personally reviewed following labs and imaging studies  LABORATORY  DATA: CBC:  Recent Labs Lab 04/09/16 1508 04/11/16 1819 04/11/16 2213 04/12/16 0642 04/13/16 0305  WBC 4.1 3.9* 3.6* 4.1 4.0  NEUTROABS 3.4 3.1  --  2.6  --   HGB 9.1* 8.2* 7.7* 7.7* 7.4*  HCT 26.9* 23.6* 22.8* 22.7* 22.4*  MCV 88.5 86.8 89.4 89.0 90.0  PLT 275 218 208 212 297    Basic Metabolic Panel:  Recent Labs Lab 04/09/16 1508 04/11/16 1819 04/11/16 2213 04/12/16 0358 04/12/16 1543 04/13/16 0305  NA 133* 128*  --  129*  --  131*  K 3.8 3.2*  --  3.7  --  3.2*  CL 99* 95*  --  99*  --  101  CO2 25 25  --  21*  --  26  GLUCOSE 96 94  --  80  --  101*  BUN 22* 16  --  12  --  8  CREATININE 0.96 0.83 0.78 0.73  --  0.73  CALCIUM 8.8* 7.9*  --  7.8*  --  7.8*  MG  --   --   --   --  1.6*  --   PHOS  --   --   --   --  3.4  --     GFR: Estimated Creatinine Clearance: 86.6 mL/min (by C-G formula based on SCr of 0.73 mg/dL).  Liver Function Tests:  Recent Labs Lab 04/09/16 1508 04/11/16 1819 04/12/16 0358  AST 19 28 36  ALT 15* 18 18  ALKPHOS 74 86 90  BILITOT 0.4 0.6 0.6  PROT 8.6* 7.3 6.9  ALBUMIN 3.2* 2.7* 2.6*   No results for input(s): LIPASE, AMYLASE in the last 168 hours. No results for input(s): AMMONIA in the last 168 hours.  Coagulation Profile: No results for input(s): INR, PROTIME in the last 168 hours.  Cardiac Enzymes:  Recent Labs Lab 04/11/16 2213 04/12/16 0356  TROPONINI <0.03 <0.03    BNP (last 3 results) No results for input(s): PROBNP in the last 8760 hours.  HbA1C: No results for input(s): HGBA1C in the last 72 hours.  CBG:  Recent Labs Lab 04/12/16 0656 04/13/16 0734  GLUCAP 95 97    Lipid Profile: No results for input(s): CHOL, HDL, LDLCALC, TRIG, CHOLHDL, LDLDIRECT in the last 72 hours.  Thyroid Function Tests: No results for input(s): TSH, T4TOTAL, FREET4, T3FREE, THYROIDAB in the last 72 hours.  Anemia Panel:  Recent Labs  04/12/16 1543  VITAMINB12 700  FOLATE 23.4  FERRITIN 437*  TIBC 168*    IRON 28*  RETICCTPCT 0.7    Urine analysis:    Component Value Date/Time   COLORURINE YELLOW 04/12/2016 0301   APPEARANCEUR CLEAR 04/12/2016 0301   LABSPEC 1.016 04/12/2016 0301   PHURINE 5.5 04/12/2016 0301   GLUCOSEU NEGATIVE 04/12/2016 0301   HGBUR NEGATIVE 04/12/2016 0301   BILIRUBINUR NEGATIVE 04/12/2016 0301   KETONESUR NEGATIVE 04/12/2016 0301   PROTEINUR NEGATIVE 04/12/2016 0301   NITRITE NEGATIVE 04/12/2016 0301   LEUKOCYTESUR NEGATIVE 04/12/2016 0301    Sepsis Labs: Lactic Acid, Venous    Component Value Date/Time   LATICACIDVEN 0.8 04/12/2016 0038    MICROBIOLOGY: Recent Results (from the past 240 hour(s))  Blood Culture (routine x 2)     Status: Maurice Mcknight (Preliminary result)   Collection Time: 04/11/16  6:19 PM  Result Value Ref Range Status   Specimen Description BLOOD RIGHT HAND  Final   Special Requests BOTTLES DRAWN AEROBIC AND ANAEROBIC 5CC  Final   Culture   Final    NO GROWTH < 24 HOURS Performed at [facility]    Report Status PENDING  Incomplete  Blood Culture (routine x 2)     Status: Maurice Mcknight (Preliminary result)   Collection Time: 04/11/16  6:26 PM  Result Value Ref Range Status   Specimen Description BLOOD LEFT HAND  Final   Special Requests BOTTLES DRAWN AEROBIC AND ANAEROBIC 5CC  Final   Culture   Final    NO GROWTH < 24 HOURS Performed at [facility]    Report Status PENDING  Incomplete  MRSA PCR Screening     Status: Maurice Mcknight   Collection Time: 04/12/16  3:03 AM  Result Value Ref Range Status   MRSA by PCR NEGATIVE NEGATIVE Final    Comment:        The GeneXpert MRSA Assay (FDA approved for NASAL specimens only), is one component of a comprehensive MRSA colonization surveillance program. It is not intended to diagnose MRSA infection nor to guide or monitor treatment for MRSA infections.     RADIOLOGY STUDIES/RESULTS: Dg Chest 2 View  Result Date: 04/09/2016 CLINICAL DATA:  Patient status post fall. History of lung  cancer. Weakness. EXAM: CHEST  2 VIEW COMPARISON:  Chest radiograph 09/15/2015. FINDINGS: Monitoring leads overlie the patient. Normal cardiac and mediastinal contours. Left upper lobe perihilar consolidation. Right lung is clear. No pleural  effusion or pneumothorax. Tenting of the left hemidiaphragm. Regional skeleton is unremarkable. IMPRESSION: No acute process within the chest. Probable post radiation changes within the left lung. Electronically Signed   By: Lovey Newcomer M.D.   On: 04/09/2016 14:23   Ct Head Wo Contrast  Result Date: 04/12/2016 CLINICAL DATA:  Weakness of left upper extremity. EXAM: CT HEAD WITHOUT CONTRAST TECHNIQUE: Contiguous axial images were obtained from the base of the skull through the vertex without intravenous contrast. COMPARISON:  Three days ago FINDINGS: Brain: No evidence of acute infarction, hemorrhage, hydrocephalus, extra-axial collection or mass lesion/mass effect. Mild white matter disease is underestimated compared to 09/13/2015 brain MRI. If seizure is being considered there is no cortical finding to explain seizure. Vascular: No hyperdense vessel or unexpected calcification. Skull: Normal. Negative for fracture or focal lesion. Sinuses/Orbits: No acute finding. Other: These results were called by telephone at the time of interpretation on 04/12/2016 at 7:41 am to ICU RN Jeannene Patella, who verbally acknowledged these results. IMPRESSION: Stable exam.  No acute intracranial finding. Electronically Signed   By: Monte Fantasia M.D.   On: 04/12/2016 07:47   Ct Head Wo Contrast  Result Date: 04/09/2016 CLINICAL DATA:  Status post fall, generalized pain EXAM: CT HEAD WITHOUT CONTRAST CT CERVICAL SPINE WITHOUT CONTRAST TECHNIQUE: Multidetector CT imaging of the head and cervical spine was performed following the standard protocol without intravenous contrast. Multiplanar CT image reconstructions of the cervical spine were also generated. COMPARISON:  Maurice Mcknight. FINDINGS: CT HEAD FINDINGS  Brain: No evidence of acute infarction, hemorrhage, hydrocephalus, extra-axial collection or mass lesion/mass effect. Vascular: No hyperdense vessel or unexpected calcification. Skull: No osseous abnormality. Sinuses/Orbits: Visualized paranasal sinuses are clear. Visualized mastoid sinuses are clear. Visualized orbits demonstrate no focal abnormality. Other: Maurice Mcknight CT CERVICAL SPINE FINDINGS Alignment: Normal. Skull base and vertebrae: No acute fracture. No primary bone lesion or focal pathologic process. Soft tissues and spinal canal: No prevertebral fluid or swelling. No visible canal hematoma. Disc levels: Mild degenerative disc disease at C5-6 with a broad-based disc osteophyte complex impressing on the thecal sac. Upper chest: Negative. Other: No fluid collection or hematoma. IMPRESSION: 1. No acute intracranial pathology. 2. No acute osseous injury of the cervical spine. 3. Degenerative disc disease and mild broad-based disc osteophyte complex at C5-6. Electronically Signed   By: Kathreen Devoid   On: 04/09/2016 14:36   Ct Chest Mcknight Contrast  Result Date: 04/04/2016 CLINICAL DATA:  Left upper lobe lung cancer, status post chemotherapy and XRT, cough EXAM: CT CHEST WITH CONTRAST TECHNIQUE: Multidetector CT imaging of the chest was performed during intravenous contrast administration. CONTRAST:  63m ISOVUE-300 IOPAMIDOL (ISOVUE-300) INJECTION 61% COMPARISON:  Maurice Mcknight. FINDINGS: Cardiovascular: Heart is normal in size.  No pericardial effusion. Mild coronary atherosclerosis in the LAD. 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 Mediastinum/Nodes: No suspicious mediastinal lymphadenopathy. Mild soft tissue thickening along the left perihilar region, likely reflecting radiation changes. Visualized thyroid is unremarkable. Mild wall thickening along the mid/distal esophagus (series 2/image 67), likely reflecting radiation changes. Lungs/Pleura: Radiation changes in the left upper lobe, left perihilar  region, and superior segment left lower lobe (series 5/image 67), new/progressed from the prior. Associated volume loss in the left lung. No suspicious pulmonary nodules. No focal consolidation.  Mild biapical pleural-parenchymal scarring. No pleural effusion or pneumothorax. Upper Abdomen: Visualized upper abdomen is unremarkable. Musculoskeletal: Visualized osseous structures are within normal limits. IMPRESSION: Scattered radiation changes in the left lung, new/ progressed from the prior. No findings specific  for recurrent or metastatic disease. Electronically Signed   By: Julian Hy M.D.   On: 04/04/2016 09:50   Ct Cervical Spine Wo Contrast  Result Date: 04/09/2016 CLINICAL DATA:  Status post fall, generalized pain EXAM: CT HEAD WITHOUT CONTRAST CT CERVICAL SPINE WITHOUT CONTRAST TECHNIQUE: Multidetector CT imaging of the head and cervical spine was performed following the standard protocol without intravenous contrast. Multiplanar CT image reconstructions of the cervical spine were also generated. COMPARISON:  Maurice Mcknight. FINDINGS: CT HEAD FINDINGS Brain: No evidence of acute infarction, hemorrhage, hydrocephalus, extra-axial collection or mass lesion/mass effect. Vascular: No hyperdense vessel or unexpected calcification. Skull: No osseous abnormality. Sinuses/Orbits: Visualized paranasal sinuses are clear. Visualized mastoid sinuses are clear. Visualized orbits demonstrate no focal abnormality. Other: Maurice Mcknight CT CERVICAL SPINE FINDINGS Alignment: Normal. Skull base and vertebrae: No acute fracture. No primary bone lesion or focal pathologic process. Soft tissues and spinal canal: No prevertebral fluid or swelling. No visible canal hematoma. Disc levels: Mild degenerative disc disease at C5-6 with a broad-based disc osteophyte complex impressing on the thecal sac. Upper chest: Negative. Other: No fluid collection or hematoma. IMPRESSION: 1. No acute intracranial pathology. 2. No acute osseous injury of the  cervical spine. 3. Degenerative disc disease and mild broad-based disc osteophyte complex at C5-6. Electronically Signed   By: Kathreen Devoid   On: 04/09/2016 14:36   Maurice Mcknight Wo Contrast  Result Date: 04/12/2016 CLINICAL DATA:  55 year old male with stage IIIa non-small cell cancer of the lung who completed concurrent chemoradiation on 11/25/15, currently under observation by his oncologist, admitted on 9/27 for evaluation of fever, chills, cough and a fall on 9/25. Staging. Subsequent encounter. EXAM: MRI HEAD WITHOUT AND WITH CONTRAST TECHNIQUE: Multiplanar, multiecho pulse sequences of the brain and surrounding structures were obtained without and with intravenous contrast. CONTRAST:  11m MULTIHANCE GADOBENATE DIMEGLUMINE 529 MG/ML IV SOLN COMPARISON:  Head CT without contrast 0726 hours today. Brain MRI without and with contrast 09/13/2015. FINDINGS: Brain: No abnormal enhancement identified. No midline shift, mass effect, or evidence of intracranial mass lesion. No dural thickening. No restricted diffusion to suggest acute infarction. No ventriculomegaly, extra-axial collection or acute intracranial hemorrhage. Cervicomedullary junction and pituitary are within normal limits. Mild for age nonspecific cerebral white matter T2 and FLAIR hyperintensity has not significantly changed. No cortical encephalomalacia or chronic cerebral blood products. Deep gray matter nuclei, brainstem, and cerebellum remain normal. Vascular: Major intracranial vascular flow voids are stable and appear normal. Skull and upper cervical spine: Negative visualized cervical spine and spinal cord. Stable bone marrow signal, mildly heterogeneous but no destructive osseous lesion identified. Sinuses/Orbits: Stable and negative orbits. Visualized paranasal sinuses and mastoids are stable and well pneumatized. Other: Visualized scalp soft tissues are within normal limits. Visible internal auditory structures appear normal. IMPRESSION: 1.   No metastatic disease or acute intracranial abnormality. 2. Stable MRI appearance of the brain. Mild for age nonspecific white matter changes. Electronically Signed   By: HGenevie AnnM.D.   On: 04/12/2016 12:41   Dg Chest Portable 1 View  Result Date: 04/11/2016 CLINICAL DATA:  Acute onset of chills, fever and high blood pressure. Sepsis. Initial encounter. EXAM: PORTABLE CHEST 1 VIEW COMPARISON:  Chest radiograph from 09/25 04/09/2016, and CT of the chest performed 04/04/2016 FINDINGS: Left perihilar postradiation changes are again noted, relatively similar to the prior study. No definite acute focal airspace consolidation is seen, though it cannot be entirely excluded. No pleural effusion or pneumothorax identified. The cardiomediastinal silhouette is  normal in size. No acute osseous abnormalities are seen. IMPRESSION: Stable appearance to left perihilar postradiation changes. No definite acute focal airspace consolidation seen, though it cannot be entirely excluded. Electronically Signed   By: Garald Balding M.D.   On: 04/11/2016 19:11     LOS: 2 days   Oren Binet, MD  [facility] Pager:[phone]  If 7PM-7AM, please contact night-coverage [email or web address] Password [facility] 04/13/2016, 10:51 AM

## 2016-04-13 NOTE — Care Management Note (Signed)
Case Management Note  Patient Details  Name: Maurice Mcknight MRN: [id number] Date of Birth: [birth year]  Subjective/Objective:   Patient presents with sepsis, he lives alone, pta indep, he has medicaid as secondary and he gets his medications thru medicaid.  He states he needs a bus pass when he goes home.  Will let CSW know.  He has a pcp on his medicaid card and he does not want another pcp.                   Action/Plan:   Expected Discharge Date:   (unknown)               Expected Discharge Plan:  Home/Self Care  In-House Referral:  Clinical Social Work  Discharge planning Services  CM Consult  Post Acute Care Choice:    Choice offered to:     DME Arranged:    DME Agency:     HH Arranged:    HH Agency:     Status of Service:  In process, will continue to follow  If discussed at Long Length of Stay Meetings, dates discussed:    Additional Comments:  Zenon Mayo, RN 04/13/2016, 3:35 PM

## 2016-04-13 NOTE — Progress Notes (Addendum)
Attempted report to 5 MW. Gave phone number to call when ready.

## 2016-04-14 DIAGNOSIS — G8194 Hemiplegia, unspecified affecting left nondominant side: Secondary | ICD-10-CM

## 2016-04-14 LAB — CBC
HEMATOCRIT: 23.4 % — AB (ref 39.0–52.0)
HEMOGLOBIN: 7.5 g/dL — AB (ref 13.0–17.0)
MCH: 28.7 pg (ref 26.0–34.0)
MCHC: 32.1 g/dL (ref 30.0–36.0)
MCV: 89.7 fL (ref 78.0–100.0)
Platelets: 287 10*3/uL (ref 150–400)
RBC: 2.61 MIL/uL — AB (ref 4.22–5.81)
RDW: 14.4 % (ref 11.5–15.5)
WBC: 4.1 10*3/uL (ref 4.0–10.5)

## 2016-04-14 LAB — BASIC METABOLIC PANEL
ANION GAP: 8 (ref 5–15)
BUN: 5 mg/dL — ABNORMAL LOW (ref 6–20)
CHLORIDE: 95 mmol/L — AB (ref 101–111)
CO2: 26 mmol/L (ref 22–32)
Calcium: 7.8 mg/dL — ABNORMAL LOW (ref 8.9–10.3)
Creatinine, Ser: 0.68 mg/dL (ref 0.61–1.24)
GFR calc non Af Amer: >60 mL/min (ref >60)
Glucose, Bld: 92 mg/dL (ref 65–99)
Potassium: 3.2 mmol/L — ABNORMAL LOW (ref 3.5–5.1)
Sodium: 129 mmol/L — ABNORMAL LOW (ref 135–145)

## 2016-04-14 MED ORDER — POTASSIUM CHLORIDE CRYS ER 20 MEQ PO TBCR
40.0000 meq | EXTENDED_RELEASE_TABLET | Freq: Once | ORAL | Status: AC
  Administered 2016-04-14: 40 meq via ORAL
  Filled 2016-04-14: qty 2

## 2016-04-14 MED ORDER — AMOXICILLIN-POT CLAVULANATE 875-125 MG PO TABS: 1.0000 | ORAL_TABLET | Freq: Two times a day (BID) | ORAL | 0 refills | Status: AC

## 2016-04-14 NOTE — Progress Notes (Signed)
PROGRESS NOTE        PATIENT DETAILS Name: Maurice Mcknight Age: 55 y.o. Sex: male Date of Birth: 03-08-1961 Admit Date: 04/11/2016 Admitting Physician Rise Patience, MD PCP:No PCP Per Patient  Brief Narrative: Patient is a 55 y.o. 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 Initially he was hypotensive, he was thought to have sepsis probably from pneumonia and was empirically started on broad-spectrum antibiotics. He was subsequently admitted to the hospitalist service, on 9/28 morning-he had a coughing spell, following which he became unresponsive with seizure like activity and transient left sided weakness. Urgent CT head was negative, patient was transferred to [facility] for Neurology evaluation. 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 Will consult GI on call today 9/30.  Subjective: Awake and alert. Continues to coughs every time he eats, but not with liquids.   Feels better than on initial presentation. No fever overnight, on room air, intermittent coughing.  Assessment/Plan: Principal Problem: Sepsis likely secondary to pneumonia: Sepsis pathophysiology has resolved.Afebrile overnight, blood pressure stable. Influenza PCR negative. Blood cultures negative so far-MRSA PCR also negative-stop vancomycin on 9/29, continue empiric Zosyn. Abnormal esophogram which was recommended by SLP, will consult GI for recommendations today 9/30.  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 MRI brain negative for  CVA/metastases-although EEG did not show any epileptiform activity-it is highly suggestive of recent seizure-like activity. Spoke with neurology-Dr. Oster-recommends to continue to monitor without any antiepileptic therapy at this time. If he were to have a second seizure, patient will then be started on antiepileptics.  Anemia: Suspect secondary to chronic illness-no overt evidence of blood loss. Anemia panel with elevated ferritin-highly suggestive of chronic disease. Appears to be asymptomatic-we'll transfuse if further drop in hemoglobin.  Hyponatremia: Very mild, stable for continued monitoring without any need for further workup at this time. Could have SIADH due to underlying malignancy/pneumonia.  Hypokalemia: Replete and recheck.  Non-small cell cancer of the lung: Completed chemotherapy/radiation-currently under observation by his Oncologist. Most recent CT scan of the chest on 9/20 demonstrated improvement with no recurrent or metastatic disease-it showed some scattered radiation changes to his left lung.  DVT Prophylaxis: Prophylactic Lovenox   Code Status: Full code   Family Communication: None at bedside  Disposition Plan: Remain inpatient-continue neuro telemetry-suspect home in the next few days.  Antimicrobial agents: IV Vanco 9/27>>9/29 IV Zosyn 9/27>>  Procedures: None  CONSULTS:  neurology GI  Time spent: 25 minutes-Greater than 50% of this time was spent in counseling, explanation of diagnosis, planning of further management, and coordination of care.  MEDICATIONS: Anti-infectives    Start     Dose/Rate Route Frequency Ordered Stop   04/12/16 0600  vancomycin (VANCOCIN) IVPB 750 mg/150 ml premix  Status:  Discontinued     750 mg 150 mL/hr over 60 Minutes Intravenous Every 12 hours 04/11/16 2018 04/13/16 1059   04/12/16 0000  piperacillin-tazobactam (ZOSYN) IVPB 3.375 g     3.375 g 12.5  mL/hr over 240 Minutes Intravenous Every 8 hours 04/11/16 2018      04/11/16 1800  piperacillin-tazobactam (ZOSYN) IVPB 3.375 g     3.375 g 100 mL/hr over 30 Minutes Intravenous  Once 04/11/16 1758 04/11/16 1903   04/11/16 1800  vancomycin (VANCOCIN) IVPB 1000 mg/200 mL premix     1,000 mg 200 mL/hr over 60 Minutes Intravenous  Once 04/11/16 1758 04/11/16 2029      Scheduled Meds: . enoxaparin (LOVENOX) injection  40 mg Subcutaneous Q24H  . guaiFENesin  600 mg Oral BID  . mouth rinse  15 mL Mouth Rinse BID  . pantoprazole  40 mg Oral Q1200  . piperacillin-tazobactam (ZOSYN)  IV  3.375 g Intravenous Q8H  . potassium chloride  40 mEq Oral Once   Continuous Infusions:   PRN Meds:.acetaminophen **OR** acetaminophen, albuterol, chlorpheniramine-HYDROcodone, ondansetron **OR** ondansetron (ZOFRAN) IV   PHYSICAL EXAM: Vital signs: Vitals:   04/13/16 2258 04/14/16 0016 04/14/16 0145 04/14/16 0434  BP: 96/69 115/75 102/71 114/73  Pulse: 80 73 78 74  Resp: (!) 35 (!) '22 20 20  '$ Temp: 99.7 F (37.6 C) 98.1 F (36.7 C) 99.1 F (37.3 C) 98.8 F (37.1 C)  TempSrc: Oral Oral Oral Oral  SpO2: 99% 100% 95% 96%  Weight:      Height:       Filed Weights   04/11/16 1822 04/12/16 0227 04/12/16 0914  Weight: 57.6 kg (127 lb) 61.3 kg (135 lb 2.3 oz) 60.1 kg (132 lb 7.9 oz)   Body mass index is 17.48 kg/m.   General appearance :Awake, alert, not in any distress.  Not toxic Looking HEENT: Atraumatic and Normocephalic Neck: supple, no JVD. No cervical lymphadenopathy. No thyromegaly Resp:Good air entry bilaterally, no wheezing or crackles  CVS: S1 S2 regular, no murmurs.  GI: Bowel sounds present, Non tender and not distended with no gaurding, rigidity or rebound.No organomegaly Extremities: B/L Lower Ext shows no edema, both legs are warm to touch Neurology:  speech clear,Non focal, sensation is grossly intact. Psychiatric: Normal judgment and insight. Alert and oriented x 3. Normal mood. Musculoskeletal:No digital cyanosis Skin:No Rash, warm and  dry Wounds:N/A  I have personally reviewed following labs and imaging studies  LABORATORY DATA: CBC:  Recent Labs Lab 04/09/16 1508 04/11/16 1819 04/11/16 2213 04/12/16 0642 04/13/16 0305 04/14/16 0615  WBC 4.1 3.9* 3.6* 4.1 4.0 4.1  NEUTROABS 3.4 3.1  --  2.6  --   --   HGB 9.1* 8.2* 7.7* 7.7* 7.4* 7.5*  HCT 26.9* 23.6* 22.8* 22.7* 22.4* 23.4*  MCV 88.5 86.8 89.4 89.0 90.0 89.7  PLT 275 218 208 212 257 867    Basic Metabolic Panel:  Recent Labs Lab 04/09/16 1508 04/11/16 1819 04/11/16 2213 04/12/16 0358 04/12/16 1543 04/13/16 0305  NA 133* 128*  --  129*  --  131*  K 3.8 3.2*  --  3.7  --  3.2*  CL 99* 95*  --  99*  --  101  CO2 25 25  --  21*  --  26  GLUCOSE 96 94  --  80  --  101*  BUN 22* 16  --  12  --  8  CREATININE 0.96 0.83 0.78 0.73  --  0.73  CALCIUM 8.8* 7.9*  --  7.8*  --  7.8*  MG  --   --   --   --  1.6*  --   PHOS  --   --   --   --  3.4  --     GFR: Estimated Creatinine Clearance: 86.6 mL/min (by C-G formula based on SCr of 0.73 mg/dL).  Liver Function Tests:  Recent Labs Lab 04/09/16 1508 04/11/16 1819 04/12/16 0358  AST 19 28 36  ALT 15* 18 18  ALKPHOS 74 86 90  BILITOT 0.4 0.6 0.6  PROT 8.6* 7.3 6.9  ALBUMIN 3.2* 2.7* 2.6*   No results for input(s): LIPASE, AMYLASE in the last 168 hours. No results for input(s): AMMONIA in the last 168 hours.  Coagulation Profile: No results for input(s): INR, PROTIME in the last 168 hours.  Cardiac Enzymes:  Recent Labs Lab 04/11/16 2213 04/12/16 0356  TROPONINI <0.03 <0.03    BNP (last 3 results) No results for input(s): PROBNP in the last 8760 hours.  HbA1C: No results for input(s): HGBA1C in the last 72 hours.  CBG:  Recent Labs Lab 04/12/16 0656 04/13/16 0734  GLUCAP 95 97    Lipid Profile: No results for input(s): CHOL, HDL, LDLCALC, TRIG, CHOLHDL, LDLDIRECT in the last 72 hours.  Thyroid Function Tests: No results for input(s): TSH, T4TOTAL, FREET4, T3FREE,  THYROIDAB in the last 72 hours.  Anemia Panel:  Recent Labs  04/12/16 1543  VITAMINB12 700  FOLATE 23.4  FERRITIN 437*  TIBC 168*  IRON 28*  RETICCTPCT 0.7    Urine analysis:    Component Value Date/Time   COLORURINE YELLOW 04/12/2016 0301   APPEARANCEUR CLEAR 04/12/2016 0301   LABSPEC 1.016 04/12/2016 0301   PHURINE 5.5 04/12/2016 0301   GLUCOSEU NEGATIVE 04/12/2016 0301   HGBUR NEGATIVE 04/12/2016 0301   BILIRUBINUR NEGATIVE 04/12/2016 0301   KETONESUR NEGATIVE 04/12/2016 0301   PROTEINUR NEGATIVE 04/12/2016 0301   NITRITE NEGATIVE 04/12/2016 0301   LEUKOCYTESUR NEGATIVE 04/12/2016 0301    Sepsis Labs: Lactic Acid, Venous    Component Value Date/Time   LATICACIDVEN 0.8 04/12/2016 0038    MICROBIOLOGY: Recent Results (from the past 240 hour(s))  Blood Culture (routine x 2)     Status: None (Preliminary result)   Collection Time: 04/11/16  6:19 PM  Result Value Ref Range Status   Specimen Description BLOOD RIGHT HAND  Final   Special Requests BOTTLES DRAWN AEROBIC AND ANAEROBIC 5CC  Final   Culture   Final    NO GROWTH 2 DAYS Performed at [facility]    Report Status PENDING  Incomplete  Blood Culture (routine x 2)     Status: None (Preliminary result)   Collection Time: 04/11/16  6:26 PM  Result Value Ref Range Status   Specimen Description BLOOD LEFT HAND  Final   Special Requests BOTTLES DRAWN AEROBIC AND ANAEROBIC 5CC  Final   Culture   Final    NO GROWTH 2 DAYS Performed at [facility]    Report Status PENDING  Incomplete  Urine culture     Status: None   Collection Time: 04/12/16  3:01 AM  Result Value Ref Range Status   Specimen Description URINE, CLEAN CATCH  Final   Special Requests NONE  Final   Culture NO GROWTH Performed at [facility]   Final   Report Status 04/13/2016 FINAL  Final  MRSA PCR Screening     Status: None   Collection Time: 04/12/16  3:03 AM  Result Value Ref Range Status   MRSA by PCR NEGATIVE  NEGATIVE Final    Comment:        The GeneXpert MRSA Assay (FDA approved for NASAL specimens only), is  one component of a comprehensive MRSA colonization surveillance program. It is not intended to diagnose MRSA infection nor to guide or monitor treatment for MRSA infections.     RADIOLOGY STUDIES/RESULTS: Dg Chest 2 View  Result Date: 04/09/2016 CLINICAL DATA:  Patient status post fall. History of lung cancer. Weakness. EXAM: CHEST  2 VIEW COMPARISON:  Chest radiograph 09/15/2015. FINDINGS: Monitoring leads overlie the patient. Normal cardiac and mediastinal contours. Left upper lobe perihilar consolidation. Right lung is clear. No pleural effusion or pneumothorax. Tenting of the left hemidiaphragm. Regional skeleton is unremarkable. IMPRESSION: No acute process within the chest. Probable post radiation changes within the left lung. Electronically Signed   By: Lovey Newcomer M.D.   On: 04/09/2016 14:23   Ct Head Wo Contrast  Result Date: 04/12/2016 CLINICAL DATA:  Weakness of left upper extremity. EXAM: CT HEAD WITHOUT CONTRAST TECHNIQUE: Contiguous axial images were obtained from the base of the skull through the vertex without intravenous contrast. COMPARISON:  Three days ago FINDINGS: Brain: No evidence of acute infarction, hemorrhage, hydrocephalus, extra-axial collection or mass lesion/mass effect. Mild white matter disease is underestimated compared to 09/13/2015 brain MRI. If seizure is being considered there is no cortical finding to explain seizure. Vascular: No hyperdense vessel or unexpected calcification. Skull: Normal. Negative for fracture or focal lesion. Sinuses/Orbits: No acute finding. Other: These results were called by telephone at the time of interpretation on 04/12/2016 at 7:41 am to ICU RN Jeannene Patella, who verbally acknowledged these results. IMPRESSION: Stable exam.  No acute intracranial finding. Electronically Signed   By: Monte Fantasia M.D.   On: 04/12/2016 07:47   Ct Head Wo  Contrast  Result Date: 04/09/2016 CLINICAL DATA:  Status post fall, generalized pain EXAM: CT HEAD WITHOUT CONTRAST CT CERVICAL SPINE WITHOUT CONTRAST TECHNIQUE: Multidetector CT imaging of the head and cervical spine was performed following the standard protocol without intravenous contrast. Multiplanar CT image reconstructions of the cervical spine were also generated. COMPARISON:  None. FINDINGS: CT HEAD FINDINGS Brain: No evidence of acute infarction, hemorrhage, hydrocephalus, extra-axial collection or mass lesion/mass effect. Vascular: No hyperdense vessel or unexpected calcification. Skull: No osseous abnormality. Sinuses/Orbits: Visualized paranasal sinuses are clear. Visualized mastoid sinuses are clear. Visualized orbits demonstrate no focal abnormality. Other: None CT CERVICAL SPINE FINDINGS Alignment: Normal. Skull base and vertebrae: No acute fracture. No primary bone lesion or focal pathologic process. Soft tissues and spinal canal: No prevertebral fluid or swelling. No visible canal hematoma. Disc levels: Mild degenerative disc disease at C5-6 with a broad-based disc osteophyte complex impressing on the thecal sac. Upper chest: Negative. Other: No fluid collection or hematoma. IMPRESSION: 1. No acute intracranial pathology. 2. No acute osseous injury of the cervical spine. 3. Degenerative disc disease and mild broad-based disc osteophyte complex at C5-6. Electronically Signed   By: Kathreen Devoid   On: 04/09/2016 14:36   Ct Chest W Contrast  Result Date: 04/04/2016 CLINICAL DATA:  Left upper lobe lung cancer, status post chemotherapy and XRT, cough EXAM: CT CHEST WITH CONTRAST TECHNIQUE: Multidetector CT imaging of the chest was performed during intravenous contrast administration. CONTRAST:  61m ISOVUE-300 IOPAMIDOL (ISOVUE-300) INJECTION 61% COMPARISON:  None. FINDINGS: Cardiovascular: Heart is normal in size.  No pericardial effusion. Mild coronary atherosclerosis in the LAD. 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 Mediastinum/Nodes: No suspicious mediastinal lymphadenopathy. Mild soft tissue thickening along the left perihilar region, likely reflecting radiation changes. Visualized thyroid is unremarkable. Mild wall thickening along the mid/distal esophagus (series 2/image  67), likely reflecting radiation changes. Lungs/Pleura: Radiation changes in the left upper lobe, left perihilar region, and superior segment left lower lobe (series 5/image 67), new/progressed from the prior. Associated volume loss in the left lung. No suspicious pulmonary nodules. No focal consolidation.  Mild biapical pleural-parenchymal scarring. No pleural effusion or pneumothorax. Upper Abdomen: Visualized upper abdomen is unremarkable. Musculoskeletal: Visualized osseous structures are within normal limits. IMPRESSION: Scattered radiation changes in the left lung, new/ progressed from the prior. No findings specific for recurrent or metastatic disease. Electronically Signed   By: Julian Hy M.D.   On: 04/04/2016 09:50   Ct Cervical Spine Wo Contrast  Result Date: 04/09/2016 CLINICAL DATA:  Status post fall, generalized pain EXAM: CT HEAD WITHOUT CONTRAST CT CERVICAL SPINE WITHOUT CONTRAST TECHNIQUE: Multidetector CT imaging of the head and cervical spine was performed following the standard protocol without intravenous contrast. Multiplanar CT image reconstructions of the cervical spine were also generated. COMPARISON:  None. FINDINGS: CT HEAD FINDINGS Brain: No evidence of acute infarction, hemorrhage, hydrocephalus, extra-axial collection or mass lesion/mass effect. Vascular: No hyperdense vessel or unexpected calcification. Skull: No osseous abnormality. Sinuses/Orbits: Visualized paranasal sinuses are clear. Visualized mastoid sinuses are clear. Visualized orbits demonstrate no focal abnormality. Other: None CT CERVICAL SPINE FINDINGS Alignment: Normal. Skull base and vertebrae: No acute fracture.  No primary bone lesion or focal pathologic process. Soft tissues and spinal canal: No prevertebral fluid or swelling. No visible canal hematoma. Disc levels: Mild degenerative disc disease at C5-6 with a broad-based disc osteophyte complex impressing on the thecal sac. Upper chest: Negative. Other: No fluid collection or hematoma. IMPRESSION: 1. No acute intracranial pathology. 2. No acute osseous injury of the cervical spine. 3. Degenerative disc disease and mild broad-based disc osteophyte complex at C5-6. Electronically Signed   By: Kathreen Devoid   On: 04/09/2016 14:36   Mr Brain W Wo Contrast  Result Date: 04/12/2016 CLINICAL DATA:  55 year old male with stage IIIa non-small cell cancer of the lung who completed concurrent chemoradiation on 11/25/15, currently under observation by his oncologist, admitted on 9/27 for evaluation of fever, chills, cough and a fall on 9/25. Staging. Subsequent encounter. EXAM: MRI HEAD WITHOUT AND WITH CONTRAST TECHNIQUE: Multiplanar, multiecho pulse sequences of the brain and surrounding structures were obtained without and with intravenous contrast. CONTRAST:  43m MULTIHANCE GADOBENATE DIMEGLUMINE 529 MG/ML IV SOLN COMPARISON:  Head CT without contrast 0726 hours today. Brain MRI without and with contrast 09/13/2015. FINDINGS: Brain: No abnormal enhancement identified. No midline shift, mass effect, or evidence of intracranial mass lesion. No dural thickening. No restricted diffusion to suggest acute infarction. No ventriculomegaly, extra-axial collection or acute intracranial hemorrhage. Cervicomedullary junction and pituitary are within normal limits. Mild for age nonspecific cerebral white matter T2 and FLAIR hyperintensity has not significantly changed. No cortical encephalomalacia or chronic cerebral blood products. Deep gray matter nuclei, brainstem, and cerebellum remain normal. Vascular: Major intracranial vascular flow voids are stable and appear normal. Skull and  upper cervical spine: Negative visualized cervical spine and spinal cord. Stable bone marrow signal, mildly heterogeneous but no destructive osseous lesion identified. Sinuses/Orbits: Stable and negative orbits. Visualized paranasal sinuses and mastoids are stable and well pneumatized. Other: Visualized scalp soft tissues are within normal limits. Visible internal auditory structures appear normal. IMPRESSION: 1.  No metastatic disease or acute intracranial abnormality. 2. Stable MRI appearance of the brain. Mild for age nonspecific white matter changes. Electronically Signed   By: HGenevie AnnM.D.   On: 04/12/2016  12:41   Dg Esophagus  Result Date: 04/13/2016 CLINICAL DATA:  Coughing when eating with solids. EXAM: ESOPHOGRAM/BARIUM SWALLOW TECHNIQUE: Single contrast examination was performed using  thin barium. FLUOROSCOPY TIME:  Fluoroscopy Time:  1.6 minutes Radiation Exposure Index (if provided by the fluoroscopic device): 6.'2mG'$ y air kerma Number of Acquired Spot Images: 0 COMPARISON:  Chest CT 04/04/2016 FINDINGS: Patient declined standing as he reports frequent passing out. There is a right-sided aortic arch which causes a moderate impression on the upper thoracic esophagus, causing mild stasis at the thoracic inlet. No stricture, fistula or mucosal lesion was identified. Lateral pharyngeal imaging shows no aspiration or pharyngeal obstruction. Normal esophageal motility. IMPRESSION: 1. 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 2. No stricture or fistula at the level of the patient's treated lung cancer. Electronically Signed   By: Monte Fantasia M.D.   On: 04/13/2016 13:06   Dg Chest Portable 1 View  Result Date: 04/11/2016 CLINICAL DATA:  Acute onset of chills, fever and high blood pressure. Sepsis. Initial encounter. EXAM: PORTABLE CHEST 1 VIEW COMPARISON:  Chest radiograph from 09/25 04/09/2016, and CT of the chest performed 04/04/2016 FINDINGS: Left perihilar postradiation changes  are again noted, relatively similar to the prior study. No definite acute focal airspace consolidation is seen, though it cannot be entirely excluded. No pleural effusion or pneumothorax identified. The cardiomediastinal silhouette is normal in size. No acute osseous abnormalities are seen. IMPRESSION: Stable appearance to left perihilar postradiation changes. No definite acute focal airspace consolidation seen, though it cannot be entirely excluded. Electronically Signed   By: Garald Balding M.D.   On: 04/11/2016 19:11     LOS: 3 days   Mir Marry Guan, MD  [facility] Pager:[phone]  If 7PM-7AM, please contact night-coverage [email or web address] Password [facility] 04/14/2016, 8:48 AM

## 2016-04-14 NOTE — Progress Notes (Signed)
Patient very rude with nursing staff. He asked the tech if he spoke English because he forgot to bring him a towel. RN told patient that he needs to calm down and not speak to [place] that way but he became very upset and said "don't get smart with me, you wouldn't be paid if it weren't for me." Will hand off to charge RN. Stephanye Finnicum, Rande Brunt, RN

## 2016-04-14 NOTE — Discharge Summary (Signed)
Discharge Summary  Maurice Mcknight KWI:[id number] DOB: 04-14-1961  PCP: No PCP Per Patient  Admit date: 04/11/2016 Discharge date: 04/14/2016   Recommendations for Outpatient Follow-up:  1. PCP 1-2 weeks 2. GI for new consult in 2-4 weeks regarding dysphagia/coughing. 3. [name] [facility] 2-3 weeks for hospital follow up.   Discharge Diagnoses:  Active Hospital Problems   Diagnosis Date Noted  . Sepsis ([place]) 04/11/2016  . Left hemiparesis ([place])   . Seizures ([place])   . Normochromic normocytic anemia 04/11/2016  . Malignant neoplasm of upper lobe of left lung [facility])     Resolved Hospital Problems   Diagnosis Date Noted Date Resolved  No resolved problems to display.    Discharge Condition: Stable   Diet recommendation: Regular as tolerated   Vitals:   04/14/16 0145 04/14/16 0434  BP: 102/71 114/73  Pulse: 78 74  Resp: 20 20  Temp: 99.1 F (37.3 C) 98.8 F (37.1 C)    History of present illness:  Patient is a 55 y.o. 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 Initially he was hypotensive, he was thought to have sepsis probably from pneumonia and was empirically started on broad-spectrum antibiotics. He was subsequently admitted to the hospitalist service, on 9/28 morning-he had a coughing spell, following which he became unresponsive with seizure like activity and transient left sided weakness. Urgent CT head was negative, patient was transferred to [facility] for Neurology evaluation. Neurology recommends no need for antiepileptic medication as seizure likely triggered by sepsis and hyponatremia. 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 Discussed with GI who does  not recommend inpatient evaluation, especially given ongoing treatment for PNA and recent sepsis. Also note that RN witnessed patient having pancakes for breakfast this AM with no noted coughing, choking, etc. GI recommends outpatient follow up for this in at least 2 weeks.  Hospital Course:  Principal Problem:   Sepsis [facility]) Active Problems:   Malignant neoplasm of upper lobe of left lung (HCC)   Normochromic normocytic anemia   Seizures (HCC)   Left hemiparesis (H[facility])  Sepsis likely secondary to pneumonia: Sepsis pathophysiology has resolved. Afebrile overnight, blood pressure stable. Influenza PCR negative. Blood cultures negative-MRSA PCR also negative-stopped vancomycin on 9/29, continued empiric Zosyn. Abnormal esophogram which was recommended by SLP, will consult GI as outpatient as noted above. Home today on Augmentin PO, complete today 10 day course.  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 MRI brain negative for CVA/metastases-although EEG did not show any epileptiform activity-it is highly suggestive of recent seizure-like activity. Spoke with neurology-Dr. Oster-recommends to continue to monitor without any antiepileptic therapy at this time. If he were to have a second seizure, patient will then be started on antiepileptics. Noted transient left sided weakness likely Todd's, has resolved completely.  Anemia: Suspect secondary to chronic illness-no overt evidence of blood loss. Anemia panel with elevated ferritin-highly suggestive of chronic disease. Asymptomatic, no need for transfusion.  Hyponatremia: Very mild, stable for continued monitoring without any need for further workup at this time. Could have SIADH due to underlying malignancy/pneumonia.  Hypokalemia: Repleted orally,  recheck with outpatient labs.  Non-small cell  cancer of the lung: Completed chemotherapy/radiation-currently under observation by his Oncologist. Most recent CT scan of the chest on 9/20 demonstrated improvement with no recurrent or metastatic disease-it showed some scattered radiation changes to his left lung.  Procedures:  DG Esophagus 9/29 with esophageal slowing.   Consultations:  Neurology   Discharge Exam: BP 114/73 (BP Location: Right Arm)   Pulse 74   Temp 98.8 F (37.1 C) (Oral)   Resp 20   Ht '6\' 1"'$  (1.854 m)   Wt 60.1 kg (132 lb 7.9 oz)   SpO2 96%   BMI 17.48 kg/m  General:  Alert, oriented, calm, in no acute distress  Neck: supple, no masses, trachea mildline  Cardiovascular: RRR, no murmurs or rubs, no peripheral edema  Respiratory: clear to auscultation bilaterally, no wheezes, no crackles  Abdomen: soft, nontender, nondistended, normal bowel tones heard  Skin: dry, no rashes  Musculoskeletal: no joint effusions, normal range of motion  Psychiatric: appropriate affect, normal speech  Neurologic: extraocular muscles intact, clear speech, moving all extremities with intact sensorium    Discharge Instructions You were cared for by a hospitalist during your hospital stay. If you have any questions about your discharge medications or the care you received while you were in the hospital after you are discharged, you can call the unit and asked to speak with the hospitalist on call if the hospitalist that took care of you is not available. Once you are discharged, your primary care physician will handle any further medical issues. Please note that NO REFILLS for any discharge medications will be authorized once you are discharged, as it is imperative that you return to your primary care physician (or establish a relationship with a primary care physician if you do not have one) for your aftercare needs so that they can reassess your need for medications and monitor your lab values.  Discharge Instructions    Call MD  for:  difficulty breathing, headache or visual disturbances    Complete by:  As directed    Call MD for:  persistant nausea and vomiting    Complete by:  As directed    Call MD for:  temperature >100.4    Complete by:  As directed    Diet - low sodium heart healthy    Complete by:  As directed    Increase activity slowly    Complete by:  As directed        Medication List    STOP taking these medications   levofloxacin 500 MG tablet Commonly known as:  LEVAQUIN     TAKE these medications   amoxicillin-clavulanate 875-125 MG tablet Commonly known as:  AUGMENTIN Take 1 tablet by mouth 2 (two) times daily.   guaiFENesin-dextromethorphan 100-10 MG/5ML syrup Commonly known as:  ROBITUSSIN DM Take 5 mLs by mouth every 4 (four) hours as needed for cough.   ibuprofen 200 MG tablet Commonly known as:  ADVIL,MOTRIN Take 800 mg by mouth every 6 (six) hours as needed for moderate pain. Reported on 01/05/2016   naproxen sodium 220 MG tablet Commonly known as:  ANAPROX Take 220-440 mg by mouth every 12 (twelve) hours as needed (for pain).   prochlorperazine 10 MG tablet Commonly known as:  COMPAZINE Take 1 tablet (10 mg total) by mouth every 6 (six) hours as needed for nausea or vomiting.   ranitidine 150 MG capsule Commonly known as:  ZANTAC Take 1 capsule (150 mg total) by  mouth daily.   sucralfate 1 GM/10ML suspension Commonly known as:  CARAFATE Take 10 mLs (1 g total) by mouth 4 (four) times daily -  with meals and at bedtime.      No Known Allergies Follow-up Information    [facility] Follow up in 2 week(s).   Why:  Make an appointment for consultation regarding dysphagia. Contact information: [address] [place] [address] [phone]        Eilleen Kempf., MD Follow up in 2 week(s).   Specialty:  Oncology Contact information: [address] [place] [address] [phone]            The results of significant  diagnostics from this hospitalization (including imaging, microbiology, ancillary and laboratory) are listed below for reference.    Significant Diagnostic Studies: Dg Chest 2 View  Result Date: 04/09/2016 CLINICAL DATA:  Patient status post fall. History of lung cancer. Weakness. EXAM: CHEST  2 VIEW COMPARISON:  Chest radiograph 09/15/2015. FINDINGS: Monitoring leads overlie the patient. Normal cardiac and mediastinal contours. Left upper lobe perihilar consolidation. Right lung is clear. No pleural effusion or pneumothorax. Tenting of the left hemidiaphragm. Regional skeleton is unremarkable. IMPRESSION: No acute process within the chest. Probable post radiation changes within the left lung. Electronically Signed   By: Lovey Newcomer M.D.   On: 04/09/2016 14:23   Ct Head Wo Contrast  Result Date: 04/12/2016 CLINICAL DATA:  Weakness of left upper extremity. EXAM: CT HEAD WITHOUT CONTRAST TECHNIQUE: Contiguous axial images were obtained from the base of the skull through the vertex without intravenous contrast. COMPARISON:  Three days ago FINDINGS: Brain: No evidence of acute infarction, hemorrhage, hydrocephalus, extra-axial collection or mass lesion/mass effect. Mild white matter disease is underestimated compared to 09/13/2015 brain MRI. If seizure is being considered there is no cortical finding to explain seizure. Vascular: No hyperdense vessel or unexpected calcification. Skull: Normal. Negative for fracture or focal lesion. Sinuses/Orbits: No acute finding. Other: These results were called by telephone at the time of interpretation on 04/12/2016 at 7:41 am to ICU RN Jeannene Patella, who verbally acknowledged these results. IMPRESSION: Stable exam.  No acute intracranial finding. Electronically Signed   By: Monte Fantasia M.D.   On: 04/12/2016 07:47   Ct Head Wo Contrast  Result Date: 04/09/2016 CLINICAL DATA:  Status post fall, generalized pain EXAM: CT HEAD WITHOUT CONTRAST CT CERVICAL SPINE WITHOUT CONTRAST  TECHNIQUE: Multidetector CT imaging of the head and cervical spine was performed following the standard protocol without intravenous contrast. Multiplanar CT image reconstructions of the cervical spine were also generated. COMPARISON:  None. FINDINGS: CT HEAD FINDINGS Brain: No evidence of acute infarction, hemorrhage, hydrocephalus, extra-axial collection or mass lesion/mass effect. Vascular: No hyperdense vessel or unexpected calcification. Skull: No osseous abnormality. Sinuses/Orbits: Visualized paranasal sinuses are clear. Visualized mastoid sinuses are clear. Visualized orbits demonstrate no focal abnormality. Other: None CT CERVICAL SPINE FINDINGS Alignment: Normal. Skull base and vertebrae: No acute fracture. No primary bone lesion or focal pathologic process. Soft tissues and spinal canal: No prevertebral fluid or swelling. No visible canal hematoma. Disc levels: Mild degenerative disc disease at C5-6 with a broad-based disc osteophyte complex impressing on the thecal sac. Upper chest: Negative. Other: No fluid collection or hematoma. IMPRESSION: 1. No acute intracranial pathology. 2. No acute osseous injury of the cervical spine. 3. Degenerative disc disease and mild broad-based disc osteophyte complex at C5-6. Electronically Signed   By: Kathreen Devoid   On: 04/09/2016 14:36  Ct Chest W Contrast  Result Date: 04/04/2016 CLINICAL DATA:  Left upper lobe lung cancer, status post chemotherapy and XRT, cough EXAM: CT CHEST WITH CONTRAST TECHNIQUE: Multidetector CT imaging of the chest was performed during intravenous contrast administration. CONTRAST:  71m ISOVUE-300 IOPAMIDOL (ISOVUE-300) INJECTION 61% COMPARISON:  None. FINDINGS: Cardiovascular: Heart is normal in size.  No pericardial effusion. Mild coronary atherosclerosis in the LAD. 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 Mediastinum/Nodes: No suspicious mediastinal lymphadenopathy. Mild soft tissue thickening along the left perihilar  region, likely reflecting radiation changes. Visualized thyroid is unremarkable. Mild wall thickening along the mid/distal esophagus (series 2/image 67), likely reflecting radiation changes. Lungs/Pleura: Radiation changes in the left upper lobe, left perihilar region, and superior segment left lower lobe (series 5/image 67), new/progressed from the prior. Associated volume loss in the left lung. No suspicious pulmonary nodules. No focal consolidation.  Mild biapical pleural-parenchymal scarring. No pleural effusion or pneumothorax. Upper Abdomen: Visualized upper abdomen is unremarkable. Musculoskeletal: Visualized osseous structures are within normal limits. IMPRESSION: Scattered radiation changes in the left lung, new/ progressed from the prior. No findings specific for recurrent or metastatic disease. Electronically Signed   By: SJulian HyM.D.   On: 04/04/2016 09:50   Ct Cervical Spine Wo Contrast  Result Date: 04/09/2016 CLINICAL DATA:  Status post fall, generalized pain EXAM: CT HEAD WITHOUT CONTRAST CT CERVICAL SPINE WITHOUT CONTRAST TECHNIQUE: Multidetector CT imaging of the head and cervical spine was performed following the standard protocol without intravenous contrast. Multiplanar CT image reconstructions of the cervical spine were also generated. COMPARISON:  None. FINDINGS: CT HEAD FINDINGS Brain: No evidence of acute infarction, hemorrhage, hydrocephalus, extra-axial collection or mass lesion/mass effect. Vascular: No hyperdense vessel or unexpected calcification. Skull: No osseous abnormality. Sinuses/Orbits: Visualized paranasal sinuses are clear. Visualized mastoid sinuses are clear. Visualized orbits demonstrate no focal abnormality. Other: None CT CERVICAL SPINE FINDINGS Alignment: Normal. Skull base and vertebrae: No acute fracture. No primary bone lesion or focal pathologic process. Soft tissues and spinal canal: No prevertebral fluid or swelling. No visible canal hematoma. Disc  levels: Mild degenerative disc disease at C5-6 with a broad-based disc osteophyte complex impressing on the thecal sac. Upper chest: Negative. Other: No fluid collection or hematoma. IMPRESSION: 1. No acute intracranial pathology. 2. No acute osseous injury of the cervical spine. 3. Degenerative disc disease and mild broad-based disc osteophyte complex at C5-6. Electronically Signed   By: HKathreen Devoid  On: 04/09/2016 14:36   Mr Brain W Wo Contrast  Result Date: 04/12/2016 CLINICAL DATA:  55year old male with stage IIIa non-small cell cancer of the lung who completed concurrent chemoradiation on 11/25/15, currently under observation by his oncologist, admitted on 9/27 for evaluation of fever, chills, cough and a fall on 9/25. Staging. Subsequent encounter. EXAM: MRI HEAD WITHOUT AND WITH CONTRAST TECHNIQUE: Multiplanar, multiecho pulse sequences of the brain and surrounding structures were obtained without and with intravenous contrast. CONTRAST:  169mMULTIHANCE GADOBENATE DIMEGLUMINE 529 MG/ML IV SOLN COMPARISON:  Head CT without contrast 0726 hours today. Brain MRI without and with contrast 09/13/2015. FINDINGS: Brain: No abnormal enhancement identified. No midline shift, mass effect, or evidence of intracranial mass lesion. No dural thickening. No restricted diffusion to suggest acute infarction. No ventriculomegaly, extra-axial collection or acute intracranial hemorrhage. Cervicomedullary junction and pituitary are within normal limits. Mild for age nonspecific cerebral white matter T2 and FLAIR hyperintensity has not significantly changed. No cortical encephalomalacia or chronic cerebral blood products. Deep gray matter nuclei,  brainstem, and cerebellum remain normal. Vascular: Major intracranial vascular flow voids are stable and appear normal. Skull and upper cervical spine: Negative visualized cervical spine and spinal cord. Stable bone marrow signal, mildly heterogeneous but no destructive osseous  lesion identified. Sinuses/Orbits: Stable and negative orbits. Visualized paranasal sinuses and mastoids are stable and well pneumatized. Other: Visualized scalp soft tissues are within normal limits. Visible internal auditory structures appear normal. IMPRESSION: 1.  No metastatic disease or acute intracranial abnormality. 2. Stable MRI appearance of the brain. Mild for age nonspecific white matter changes. Electronically Signed   By: Genevie Ann M.D.   On: 04/12/2016 12:41   Dg Esophagus  Result Date: 04/13/2016 CLINICAL DATA:  Coughing when eating with solids. EXAM: ESOPHOGRAM/BARIUM SWALLOW TECHNIQUE: Single contrast examination was performed using  thin barium. FLUOROSCOPY TIME:  Fluoroscopy Time:  1.6 minutes Radiation Exposure Index (if provided by the fluoroscopic device): 6.'2mG'$ y air kerma Number of Acquired Spot Images: 0 COMPARISON:  Chest CT 04/04/2016 FINDINGS: Patient declined standing as he reports frequent passing out. There is a right-sided aortic arch which causes a moderate impression on the upper thoracic esophagus, causing mild stasis at the thoracic inlet. No stricture, fistula or mucosal lesion was identified. Lateral pharyngeal imaging shows no aspiration or pharyngeal obstruction. Normal esophageal motility. IMPRESSION: 1. 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 2. No stricture or fistula at the level of the patient's treated lung cancer. Electronically Signed   By: Monte Fantasia M.D.   On: 04/13/2016 13:06   Dg Chest Portable 1 View  Result Date: 04/11/2016 CLINICAL DATA:  Acute onset of chills, fever and high blood pressure. Sepsis. Initial encounter. EXAM: PORTABLE CHEST 1 VIEW COMPARISON:  Chest radiograph from 09/25 04/09/2016, and CT of the chest performed 04/04/2016 FINDINGS: Left perihilar postradiation changes are again noted, relatively similar to the prior study. No definite acute focal airspace consolidation is seen, though it cannot be entirely  excluded. No pleural effusion or pneumothorax identified. The cardiomediastinal silhouette is normal in size. No acute osseous abnormalities are seen. IMPRESSION: Stable appearance to left perihilar postradiation changes. No definite acute focal airspace consolidation seen, though it cannot be entirely excluded. Electronically Signed   By: Garald Balding M.D.   On: 04/11/2016 19:11    Microbiology: Recent Results (from the past 240 hour(s))  Blood Culture (routine x 2)     Status: None (Preliminary result)   Collection Time: 04/11/16  6:19 PM  Result Value Ref Range Status   Specimen Description BLOOD RIGHT HAND  Final   Special Requests BOTTLES DRAWN AEROBIC AND ANAEROBIC 5CC  Final   Culture   Final    NO GROWTH 2 DAYS Performed at [facility]    Report Status PENDING  Incomplete  Blood Culture (routine x 2)     Status: None (Preliminary result)   Collection Time: 04/11/16  6:26 PM  Result Value Ref Range Status   Specimen Description BLOOD LEFT HAND  Final   Special Requests BOTTLES DRAWN AEROBIC AND ANAEROBIC 5CC  Final   Culture   Final    NO GROWTH 2 DAYS Performed at [facility]    Report Status PENDING  Incomplete  Urine culture     Status: None   Collection Time: 04/12/16  3:01 AM  Result Value Ref Range Status   Specimen Description URINE, CLEAN CATCH  Final   Special Requests NONE  Final   Culture NO GROWTH Performed at [facility]  Final   Report Status 04/13/2016 FINAL  Final  MRSA PCR Screening     Status: None   Collection Time: 04/12/16  3:03 AM  Result Value Ref Range Status   MRSA by PCR NEGATIVE NEGATIVE Final    Comment:        The GeneXpert MRSA Assay (FDA approved for NASAL specimens only), is one component of a comprehensive MRSA colonization surveillance program. It is not intended to diagnose MRSA infection nor to guide or monitor treatment for MRSA infections.      Labs: Basic Metabolic Panel:  Recent Labs Lab  04/09/16 1508 04/11/16 1819 04/11/16 2213 04/12/16 0358 04/12/16 1543 04/13/16 0305 04/14/16 0615  NA 133* 128*  --  129*  --  131* 129*  K 3.8 3.2*  --  3.7  --  3.2* 3.2*  CL 99* 95*  --  99*  --  101 95*  CO2 25 25  --  21*  --  26 26  GLUCOSE 96 94  --  80  --  101* 92  BUN 22* 16  --  12  --  8 5*  CREATININE 0.96 0.83 0.78 0.73  --  0.73 0.68  CALCIUM 8.8* 7.9*  --  7.8*  --  7.8* 7.8*  MG  --   --   --   --  1.6*  --   --   PHOS  --   --   --   --  3.4  --   --    Liver Function Tests:  Recent Labs Lab 04/09/16 1508 04/11/16 1819 04/12/16 0358  AST 19 28 36  ALT 15* 18 18  ALKPHOS 74 86 90  BILITOT 0.4 0.6 0.6  PROT 8.6* 7.3 6.9  ALBUMIN 3.2* 2.7* 2.6*   No results for input(s): LIPASE, AMYLASE in the last 168 hours. No results for input(s): AMMONIA in the last 168 hours. CBC:  Recent Labs Lab 04/09/16 1508 04/11/16 1819 04/11/16 2213 04/12/16 0642 04/13/16 0305 04/14/16 0615  WBC 4.1 3.9* 3.6* 4.1 4.0 4.1  NEUTROABS 3.4 3.1  --  2.6  --   --   HGB 9.1* 8.2* 7.7* 7.7* 7.4* 7.5*  HCT 26.9* 23.6* 22.8* 22.7* 22.4* 23.4*  MCV 88.5 86.8 89.4 89.0 90.0 89.7  PLT 275 218 208 212 257 287   Cardiac Enzymes:  Recent Labs Lab 04/11/16 2213 04/12/16 0356  TROPONINI <0.03 <0.03   BNP: BNP (last 3 results) No results for input(s): BNP in the last 8760 hours.  ProBNP (last 3 results) No results for input(s): PROBNP in the last 8760 hours.  CBG:  Recent Labs Lab 04/12/16 0656 04/13/16 0734  GLUCAP 95 97    Time spent: 42 minutes were spent in preparing this discharge including medication reconciliation, counseling, and coordination of care.  Signed:  Mert Dietrick [employer]  [facility] 04/14/2016, 9:20 AM

## 2016-04-14 NOTE — Progress Notes (Signed)
D/c to home. VSS Pain denied.  D/c and rx given and explained with understanding by patient.  All concerns addressed per patient.

## 2016-04-14 NOTE — Progress Notes (Signed)
Neurology Progress Note  Subjective: He feels much better today. He has no particular complaints apart from his chronic cough. He is eating well. He reports that he slept well last night. No further seizure activity has been reported. He is not endorsing any neurologic symptoms at this time. He is hoping he will be able to go home soon.   Current Meds:   Current Facility-Administered Medications:  .  acetaminophen (TYLENOL) tablet 650 mg, 650 mg, Oral, Q6H PRN **OR** acetaminophen (TYLENOL) suppository 650 mg, 650 mg, Rectal, Q6H PRN, Rise Patience, MD .  albuterol (PROVENTIL) (2.5 MG/3ML) 0.083% nebulizer solution 2.5 mg, 2.5 mg, Nebulization, Q2H PRN, Jonetta Osgood, MD .  chlorpheniramine-HYDROcodone (TUSSIONEX) 10-8 MG/5ML suspension 5 mL, 5 mL, Oral, Q12H PRN, Jonetta Osgood, MD, 5 mL at 04/12/16 1315 .  enoxaparin (LOVENOX) injection 40 mg, 40 mg, Subcutaneous, Q24H, Rise Patience, MD, 40 mg at 04/13/16 2236 .  guaiFENesin (MUCINEX) 12 hr tablet 600 mg, 600 mg, Oral, BID, Jonetta Osgood, MD, 600 mg at 04/13/16 2236 .  MEDLINE mouth rinse, 15 mL, Mouth Rinse, BID, Rise Patience, MD, 15 mL at 04/13/16 2236 .  ondansetron (ZOFRAN) tablet 4 mg, 4 mg, Oral, Q6H PRN **OR** ondansetron (ZOFRAN) injection 4 mg, 4 mg, Intravenous, Q6H PRN, Rise Patience, MD .  pantoprazole (PROTONIX) EC tablet 40 mg, 40 mg, Oral, Q1200, Jonetta Osgood, MD, 40 mg at 04/13/16 1113 .  piperacillin-tazobactam (ZOSYN) IVPB 3.375 g, 3.375 g, Intravenous, Q8H, Nikola Glogovac, RPH, Last Rate: 12.5 mL/hr at 04/13/16 2349, 3.375 g at 04/13/16 2349 .  potassium chloride SA (K-DUR,KLOR-CON) CR tablet 40 mEq, 40 mEq, Oral, Once, Jonetta Osgood, MD  Facility-Administered Medications Ordered in Other Encounters:  .  diphenhydrAMINE (BENADRYL) injection 50 mg, 50 mg, Intravenous, Once, Curt Bears, MD  Objective:  Temp:  [98.1 F (36.7 C)-99.7 F (37.6 C)] 98.8 F (37.1 C) (09/30  0434) Pulse Rate:  [73-91] 74 (09/30 0434) Resp:  [18-35] 20 (09/30 0434) BP: (94-115)/(69-78) 114/73 (09/30 0434) SpO2:  [95 %-100 %] 96 % (09/30 0434)  General: Thin AA man in NAD. Alert, oriented x4. Speech is clear without dysarthria. Affect is bright. Comportment is normal.  HEENT: Neck is supple without lymphadenopathy. Mucous membranes are moist and the oropharynx is clear. Sclerae are anicteric. There is no conjunctival injection.  CV: Regular, no murmur. Carotid pulses are 2+ and symmetric with no bruits. Distal pulses 2+ and symmetric.  Neuro: MS: As noted above. No aphasia.  CN: Pupils are equal and reactive from 3-->2 mm bilaterally. EOMI notable for some breakup of smooth pursuits, no nystagmus. Facial sensation is intact to light touch. Face is symmetric at rest with normal strength and mobility. Hearing is intact to conversational voice. Voice is normal in tone and quality. Palate elevates symmetrically. Uvula is midline. Bilateral SCM and trapezii are 5/5. Tongue is midline with normal bulk and mobility.  Motor: Reduced bulk with normal tone and strength throughout. No pronator drift. No tremor or other abnormal movements are observed.  Sensation: Intact to light touch.  DTRs: 2+, symmetric. Toes are downgoing bilaterally. No pathological reflexes.  Coordination: Finger-to-nose without dysmetria bilaterally.  .   Labs: Lab Results  Component Value Date   WBC 4.1 04/14/2016   HGB 7.5 (L) 04/14/2016   HCT 23.4 (L) 04/14/2016   PLT 287 04/14/2016   GLUCOSE 101 (H) 04/13/2016   ALT 18 04/12/2016   AST 36 04/12/2016   NA  131 (L) 04/13/2016   K 3.2 (L) 04/13/2016   CL 101 04/13/2016   CREATININE 0.73 04/13/2016   BUN 8 04/13/2016   CO2 26 04/13/2016   INR 1.14 09/09/2015   CBC Latest Ref Rng & Units 04/14/2016 04/13/2016 04/12/2016  WBC 4.0 - 10.5 K/uL 4.1 4.0 4.1  Hemoglobin 13.0 - 17.0 g/dL 7.5(L) 7.4(L) 7.7(L)  Hematocrit 39.0 - 52.0 % 23.4(L) 22.4(L) 22.7(L)   Platelets 150 - 400 K/uL 287 257 212    No results found for: HGBA1C Lab Results  Component Value Date   ALT 18 04/12/2016   AST 36 04/12/2016   ALKPHOS 90 04/12/2016   BILITOT 0.6 04/12/2016    A/P:   1. Seizure: He had a single seizure on 9/28. This was likely provoked in the setting of sepsis and mild hyponatremia. No recurrence. EEG showed focal R frontotemporal slowing that was likely postictal in nature, consistent with the described seizure semiology of L gaze deviation and versive head turn to the L. No epileptiform activity to suggest high likelihood of recurrence. MRI did not reveal any mets or other structural pathology to explain. Defer AED tx for now and monitor.   2. 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 No further issues.   I have no additional recommendations at this time and will sign off. Please call if any new issues arise.   Melba Coon, MD [facility]

## 2016-04-16 LAB — CULTURE, BLOOD (ROUTINE X 2)
CULTURE: NO GROWTH
Culture: NO GROWTH

## 2016-04-20 ENCOUNTER — Telehealth: Payer: Self-pay | Admitting: Internal Medicine

## 2016-04-20 NOTE — Telephone Encounter (Signed)
Left message re December and January lab/fu. Central radiology will call re scan.

## 2016-07-11 ENCOUNTER — Other Ambulatory Visit (HOSPITAL_BASED_OUTPATIENT_CLINIC_OR_DEPARTMENT_OTHER): Payer: BLUE CROSS/BLUE SHIELD

## 2016-07-11 DIAGNOSIS — C3412 Malignant neoplasm of upper lobe, left bronchus or lung: Secondary | ICD-10-CM | POA: Diagnosis not present

## 2016-07-11 DIAGNOSIS — R509 Fever, unspecified: Secondary | ICD-10-CM

## 2016-07-11 LAB — COMPREHENSIVE METABOLIC PANEL
ALT: 10 U/L (ref 0–55)
ANION GAP: 6 meq/L (ref 3–11)
AST: 16 U/L (ref 5–34)
Albumin: 3.4 g/dL — ABNORMAL LOW (ref 3.5–5.0)
Alkaline Phosphatase: 113 U/L (ref 40–150)
BUN: 13.5 mg/dL (ref 7.0–26.0)
CHLORIDE: 107 meq/L (ref 98–109)
CO2: 27 meq/L (ref 22–29)
Calcium: 8.9 mg/dL (ref 8.4–10.4)
Creatinine: 0.9 mg/dL (ref 0.7–1.3)
Glucose: 83 mg/dl (ref 70–140)
Potassium: 4.2 mEq/L (ref 3.5–5.1)
Sodium: 139 mEq/L (ref 136–145)
Total Bilirubin: <0.22 mg/dL (ref 0.20–1.20)
Total Protein: 7.6 g/dL (ref 6.4–8.3)

## 2016-07-11 LAB — CBC WITH DIFFERENTIAL/PLATELET
BASO%: 0.7 % (ref 0.0–2.0)
Basophils Absolute: 0 10*3/uL (ref 0.0–0.1)
EOS%: 4.3 % (ref 0.0–7.0)
Eosinophils Absolute: 0.1 10*3/uL (ref 0.0–0.5)
HCT: 41.7 % (ref 38.4–49.9)
HGB: 13.6 g/dL (ref 13.0–17.1)
LYMPH%: 46.2 % (ref 14.0–49.0)
MCH: 30.4 pg (ref 27.2–33.4)
MCHC: 32.6 g/dL (ref 32.0–36.0)
MCV: 93.3 fL (ref 79.3–98.0)
MONO#: 0.3 10*3/uL (ref 0.1–0.9)
MONO%: 9 % (ref 0.0–14.0)
NEUT#: 1.2 10*3/uL — ABNORMAL LOW (ref 1.5–6.5)
NEUT%: 39.8 % (ref 39.0–75.0)
PLATELETS: 175 10*3/uL (ref 140–400)
RBC: 4.47 10*6/uL (ref 4.20–5.82)
RDW: 13.1 % (ref 11.0–14.6)
WBC: 3 10*3/uL — ABNORMAL LOW (ref 4.0–10.3)
lymph#: 1.4 10*3/uL (ref 0.9–3.3)

## 2016-07-18 ENCOUNTER — Ambulatory Visit (HOSPITAL_BASED_OUTPATIENT_CLINIC_OR_DEPARTMENT_OTHER): Payer: BLUE CROSS/BLUE SHIELD | Admitting: Internal Medicine

## 2016-07-18 ENCOUNTER — Encounter: Payer: Self-pay | Admitting: Internal Medicine

## 2016-07-18 VITALS — HR 77 | Temp 98.4°F | Resp 19 | Wt 142.3 lb

## 2016-07-18 DIAGNOSIS — C3412 Malignant neoplasm of upper lobe, left bronchus or lung: Secondary | ICD-10-CM | POA: Diagnosis not present

## 2016-07-18 DIAGNOSIS — Z720 Tobacco use: Secondary | ICD-10-CM

## 2016-07-18 DIAGNOSIS — F17200 Nicotine dependence, unspecified, uncomplicated: Secondary | ICD-10-CM

## 2016-07-18 NOTE — Progress Notes (Signed)
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  PRIOR THERAPY:  1) Concurrent chemoradiation with weekly carboplatin for AUC of 2 and paclitaxel 45 MG/M2. First treatment was given on 10/10/2015. Status post 5 weeks of treatment. Last dose was given 11/14/2015 with partial response. 2) Consolidation systemic chemotherapy with carboplatin for AUC of 5 and paclitaxel 175 MG/M2 every 3 weeks with Neulasta support. First dose 01/25/2016. Status post 3 cycles.  CURRENT THERAPY: Observation.  INTERVAL HISTORY: Maurice Mcknight 56 y.o. male came to the clinic today for three-month follow-up visit. The patient has been on observation for the last few months. He is feeling fine with no specific complaints. Unfortunately he continues to smoke a few cigarettes every day. He denied having any chest pain, shortness of breath, cough or hemoptysis. He has no fever or chills. He denied having any nausea, vomiting, diarrhea or constipation. He was supposed to have repeat CT scan of the chest on 07/11/2016 but unfortunately he missed his appointment. He has bloodwork performed recently. He is here today for evaluation and recommendation regarding his condition.  MEDICAL HISTORY: Past Medical History:  Diagnosis Date  . Cancer ([place])    Lung Cancer  . Encounter for antineoplastic chemotherapy 10/17/2015  . Fever and chills 04/11/2016  . Malnutrition ([place]) 09/28/2015  . Needs smoking cessation education 09/28/2015  . Radiation 10/17/15-11/25/15   left chest 60 Gy    ALLERGIES:  has No Known Allergies.  MEDICATIONS:  Current Outpatient Prescriptions  Medication Sig Dispense Refill  .  guaiFENesin-dextromethorphan (ROBITUSSIN DM) 100-10 MG/5ML syrup Take 5 mLs by mouth every 4 (four) hours as needed for cough. 118 mL 0  . ibuprofen (ADVIL,MOTRIN) 200 MG tablet Take 800 mg by mouth every 6 (six) hours as needed for moderate pain. Reported on 01/05/2016    . naproxen sodium (ANAPROX) 220 MG tablet Take 220-440 mg by mouth every 12 (twelve) hours as needed (for pain).    . prochlorperazine (COMPAZINE) 10 MG tablet Take 1 tablet (10 mg total) by mouth every 6 (six) hours as needed for nausea or vomiting. 30 tablet 0  . ranitidine (ZANTAC) 150 MG capsule Take 1 capsule (150 mg total) by mouth daily. (Patient not taking: Reported on 04/11/2016) 30 capsule 0  . sucralfate (CARAFATE) 1 GM/10ML suspension Take 10 mLs (1 g total) by mouth 4 (four) times daily -  with meals and at bedtime. (Patient not taking: Reported on 04/11/2016) 420 mL 0   No current facility-administered medications for this visit.    Facility-Administered Medications Ordered in Other Visits  Medication Dose Route Frequency Provider Last Rate Last Dose  . diphenhydrAMINE (BENADRYL) injection 50 mg  50 mg Intravenous Once Curt Bears, MD        SURGICAL HISTORY:  Past Surgical History:  Procedure Laterality Date  . VIDEO BRONCHOSCOPY Bilateral 09/09/2015   Diagnosis    REVIEW OF SYSTEMS:  A comprehensive review of systems was negative.   PHYSICAL EXAMINATION: General appearance: alert, cooperative and no distress Head: Normocephalic, without obvious abnormality, atraumatic Neck: no adenopathy, no JVD, supple, symmetrical, trachea midline and thyroid not enlarged, symmetric, no tenderness/mass/nodules Lymph nodes: Cervical, supraclavicular, and axillary nodes normal. Resp: clear to  auscultation bilaterally Back: symmetric, no curvature. ROM normal. No CVA tenderness. Cardio: regular rate and rhythm, S1, S2 normal, no murmur, click, rub or gallop GI: soft, non-tender; bowel sounds normal; no masses,  no  organomegaly Extremities: extremities normal, atraumatic, no cyanosis or edema  ECOG PERFORMANCE STATUS: 0 - Asymptomatic  Pulse 77, temperature 98.4 F (36.9 C), temperature source Oral, resp. rate 19, weight 142 lb 4.8 oz (64.5 kg), SpO2 100 %.  LABORATORY DATA: Lab Results  Component Value Date   WBC 3.0 (L) 07/11/2016   HGB 13.6 07/11/2016   HCT 41.7 07/11/2016   MCV 93.3 07/11/2016   PLT 175 07/11/2016      Chemistry      Component Value Date/Time   NA 139 07/11/2016 1050   K 4.2 07/11/2016 1050   CL 95 (L) 04/14/2016 0615   CO2 27 07/11/2016 1050   BUN 13.5 07/11/2016 1050   CREATININE 0.9 07/11/2016 1050      Component Value Date/Time   CALCIUM 8.9 07/11/2016 1050   ALKPHOS 113 07/11/2016 1050   AST 16 07/11/2016 1050   ALT 10 07/11/2016 1050   BILITOT <0.22 07/11/2016 1050       RADIOGRAPHIC STUDIES: No results found.  ASSESSMENT AND PLAN:  This is a very pleasant 56 years old African-American male with a stage IIIa non-small cell lung cancer, squamous cell carcinoma presented with large obstructing left upper lobe lung mass as well as mediastinal lymphadenopathy in February 2017. He is status post a course of concurrent chemoradiation followed by consolidation chemotherapy. He has significant improvement in his disease after this treatment. He is currently on observation and feeling well. He was supposed to have repeat CT scan of the chest before this visit for restaging of his disease but unfortunately he missed his appointment. I will arrange for the patient to have the scan performed in the next few days. If the scan showed no evidence for disease progression, I will see him back for follow-up visit in 3 months with repeat CT scan of the chest. He was advised to call immediately she has any concerning symptoms in the interval. The patient voices understanding of current disease status and treatment options and is in agreement with the current care  plan.  All questions were answered. The patient knows to call the clinic with any problems, questions or concerns. We can certainly see the patient much sooner if necessary. I spent 10 minutes counseling the patient face to face. The total time spent in the appointment was 15 minutes. Disclaimer: This note was dictated with voice recognition software. Similar sounding words can inadvertently be transcribed and may not be corrected upon review.

## 2016-07-20 ENCOUNTER — Telehealth: Payer: Self-pay | Admitting: Internal Medicine

## 2016-07-20 NOTE — Telephone Encounter (Signed)
Left message for patient re April appointments and mailed schedule. Central radiology will call patient re scan for April.   Informed patient in above message that central radiology has been trying to contact him re ct scan due 1/5 and asked that patient call central radiology to schedule ct scan due this month.

## 2016-08-15 ENCOUNTER — Ambulatory Visit (HOSPITAL_COMMUNITY): Payer: BLUE CROSS/BLUE SHIELD

## 2016-08-22 ENCOUNTER — Ambulatory Visit (HOSPITAL_COMMUNITY)
Admission: RE | Admit: 2016-08-22 | Discharge: 2016-08-22 | Disposition: A | Discharge status: Home / Self Care | Payer: BLUE CROSS/BLUE SHIELD | Source: Ambulatory Visit | Attending: Internal Medicine | Admitting: Internal Medicine

## 2016-08-22 ENCOUNTER — Encounter (HOSPITAL_COMMUNITY): Payer: Self-pay

## 2016-08-22 DIAGNOSIS — C3412 Malignant neoplasm of upper lobe, left bronchus or lung: Secondary | ICD-10-CM | POA: Diagnosis present

## 2016-08-22 DIAGNOSIS — Z923 Personal history of irradiation: Secondary | ICD-10-CM | POA: Diagnosis not present

## 2016-08-22 DIAGNOSIS — F17200 Nicotine dependence, unspecified, uncomplicated: Secondary | ICD-10-CM | POA: Insufficient documentation

## 2016-08-22 MED ORDER — SODIUM CHLORIDE 0.9 % IJ SOLN
INTRAMUSCULAR | Status: AC
  Filled 2016-08-22: qty 50

## 2016-08-22 MED ORDER — IOPAMIDOL (ISOVUE-300) INJECTION 61%
INTRAVENOUS | Status: AC
  Filled 2016-08-22: qty 75

## 2016-08-22 MED ORDER — IOPAMIDOL (ISOVUE-300) INJECTION 61%
75.0000 mL | Freq: Once | INTRAVENOUS | Status: AC | PRN
  Administered 2016-08-22: 75 mL via INTRAVENOUS

## 2016-10-16 ENCOUNTER — Other Ambulatory Visit (HOSPITAL_BASED_OUTPATIENT_CLINIC_OR_DEPARTMENT_OTHER): Payer: BLUE CROSS/BLUE SHIELD

## 2016-10-16 DIAGNOSIS — C3412 Malignant neoplasm of upper lobe, left bronchus or lung: Secondary | ICD-10-CM | POA: Diagnosis not present

## 2016-10-16 DIAGNOSIS — F17200 Nicotine dependence, unspecified, uncomplicated: Secondary | ICD-10-CM

## 2016-10-16 LAB — CBC WITH DIFFERENTIAL/PLATELET
BASO%: 0.9 % (ref 0.0–2.0)
Basophils Absolute: 0 10*3/uL (ref 0.0–0.1)
EOS%: 2.2 % (ref 0.0–7.0)
Eosinophils Absolute: 0.1 10*3/uL (ref 0.0–0.5)
HEMATOCRIT: 39.4 % (ref 38.4–49.9)
HEMOGLOBIN: 13.2 g/dL (ref 13.0–17.1)
LYMPH#: 1.1 10*3/uL (ref 0.9–3.3)
LYMPH%: 26.7 % (ref 14.0–49.0)
MCH: 30.9 pg (ref 27.2–33.4)
MCHC: 33.6 g/dL (ref 32.0–36.0)
MCV: 92 fL (ref 79.3–98.0)
MONO#: 0.5 10*3/uL (ref 0.1–0.9)
MONO%: 11.2 % (ref 0.0–14.0)
NEUT#: 2.5 10*3/uL (ref 1.5–6.5)
NEUT%: 59 % (ref 39.0–75.0)
Platelets: 260 10*3/uL (ref 140–400)
RBC: 4.28 10*6/uL (ref 4.20–5.82)
RDW: 14 % (ref 11.0–14.6)
WBC: 4.2 10*3/uL (ref 4.0–10.3)

## 2016-10-16 LAB — COMPREHENSIVE METABOLIC PANEL
ALK PHOS: 131 U/L (ref 40–150)
ALT: 9 U/L (ref 0–55)
AST: 14 U/L (ref 5–34)
Albumin: 3.5 g/dL (ref 3.5–5.0)
Anion Gap: 8 mEq/L (ref 3–11)
BILIRUBIN TOTAL: 0.37 mg/dL (ref 0.20–1.20)
BUN: 11.9 mg/dL (ref 7.0–26.0)
CO2: 26 mEq/L (ref 22–29)
Calcium: 8.4 mg/dL (ref 8.4–10.4)
Chloride: 105 mEq/L (ref 98–109)
Creatinine: 1 mg/dL (ref 0.7–1.3)
EGFR: >90 mL/min/{1.73_m2} (ref >90)
GLUCOSE: 118 mg/dL (ref 70–140)
POTASSIUM: 4 meq/L (ref 3.5–5.1)
SODIUM: 139 meq/L (ref 136–145)
Total Protein: 7.6 g/dL (ref 6.4–8.3)

## 2016-10-19 ENCOUNTER — Telehealth: Payer: Self-pay | Admitting: *Deleted

## 2016-10-19 NOTE — Telephone Encounter (Signed)
Pt called & was notified of appt date & time for next week.

## 2016-10-23 ENCOUNTER — Encounter: Payer: Self-pay | Admitting: Internal Medicine

## 2016-10-23 ENCOUNTER — Ambulatory Visit (HOSPITAL_BASED_OUTPATIENT_CLINIC_OR_DEPARTMENT_OTHER): Payer: BLUE CROSS/BLUE SHIELD | Admitting: Internal Medicine

## 2016-10-23 VITALS — BP 90/54 | HR 76 | Temp 99.0°F | Resp 18 | Ht 73.0 in | Wt 145.7 lb

## 2016-10-23 DIAGNOSIS — Z720 Tobacco use: Secondary | ICD-10-CM | POA: Diagnosis not present

## 2016-10-23 DIAGNOSIS — Z5111 Encounter for antineoplastic chemotherapy: Secondary | ICD-10-CM

## 2016-10-23 DIAGNOSIS — C3412 Malignant neoplasm of upper lobe, left bronchus or lung: Secondary | ICD-10-CM | POA: Diagnosis not present

## 2016-10-23 NOTE — Progress Notes (Signed)
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  PRIOR THERAPY:  1) Concurrent chemoradiation with weekly carboplatin for AUC of 2 and paclitaxel 45 MG/M2. First treatment was given on 10/10/2015. Status post 5 weeks of treatment. Last dose was given 11/14/2015 with partial response. 2) Consolidation systemic chemotherapy with carboplatin for AUC of 5 and paclitaxel 175 MG/M2 every 3 weeks with Neulasta support. First dose 01/25/2016. Status post 3 cycles.  CURRENT THERAPY: Observation.  INTERVAL HISTORY: Maurice Mcknight 56 y.o. male returns to the clinic today for follow-up visit. The patient is feeling fine today with no specific complaints. He gained 3 pounds from his last visit. He denied having any chest pain, shortness of breath, cough or hemoptysis. He has no fever or chills. He denied having any nausea, vomiting, diarrhea or constipation. Unfortunately he continues to smoke a few cigarettes every day. He is here today for reevaluation and repeat blood work.  MEDICAL HISTORY: Past Medical History:  Diagnosis Date  . Cancer ([place])    Lung Cancer  . Encounter for antineoplastic chemotherapy 10/17/2015  . Fever and chills 04/11/2016  . Malnutrition ([place]) 09/28/2015  . Needs smoking cessation education 09/28/2015  . Radiation 10/17/15-11/25/15   left chest 60 Gy    ALLERGIES:  has No Known Allergies.  MEDICATIONS:  Current Outpatient Prescriptions  Medication Sig Dispense Refill  . guaiFENesin-dextromethorphan (ROBITUSSIN DM) 100-10 MG/5ML syrup Take 5 mLs by mouth every 4 (four) hours as needed for cough. 118 mL 0  . ibuprofen (ADVIL,MOTRIN) 200 MG tablet Take 800  mg by mouth every 6 (six) hours as needed for moderate pain. Reported on 01/05/2016    . naproxen sodium (ANAPROX) 220 MG tablet Take 220-440 mg by mouth every 12 (twelve) hours as needed (for pain).    . prochlorperazine (COMPAZINE) 10 MG tablet Take 1 tablet (10 mg total) by mouth every 6 (six) hours as needed for nausea or vomiting. 30 tablet 0  . ranitidine (ZANTAC) 150 MG capsule Take 1 capsule (150 mg total) by mouth daily. (Patient not taking: Reported on 04/11/2016) 30 capsule 0  . sucralfate (CARAFATE) 1 GM/10ML suspension Take 10 mLs (1 g total) by mouth 4 (four) times daily -  with meals and at bedtime. (Patient not taking: Reported on 04/11/2016) 420 mL 0   No current facility-administered medications for this visit.    Facility-Administered Medications Ordered in Other Visits  Medication Dose Route Frequency Provider Last Rate Last Dose  . diphenhydrAMINE (BENADRYL) injection 50 mg  50 mg Intravenous Once Curt Bears, MD        SURGICAL HISTORY:  Past Surgical History:  Procedure Laterality Date  . VIDEO BRONCHOSCOPY Bilateral 09/09/2015   Diagnosis    REVIEW OF SYSTEMS:  A comprehensive review of systems was negative.   PHYSICAL EXAMINATION: General appearance: alert, cooperative and no distress Head: Normocephalic, without obvious abnormality, atraumatic Neck: no adenopathy, no JVD, supple, symmetrical, trachea midline and thyroid not enlarged, symmetric, no tenderness/mass/nodules Lymph nodes: Cervical, supraclavicular, and axillary nodes normal. Resp: clear to auscultation bilaterally Back: symmetric, no curvature. ROM normal. No CVA tenderness. Cardio: regular rate and rhythm, S1, S2 normal, no murmur, click, rub or gallop GI: soft,  non-tender; bowel sounds normal; no masses,  no organomegaly Extremities: extremities normal, atraumatic, no cyanosis or edema  ECOG PERFORMANCE STATUS: 0 - Asymptomatic  Blood pressure (!) 90/54, pulse 76, temperature 99 F (37.2 C),  temperature source Oral, resp. rate 18, height '6\' 1"'$  (1.854 m), weight 145 lb 11.2 oz (66.1 kg), SpO2 100 %.  LABORATORY DATA: Lab Results  Component Value Date   WBC 4.2 10/16/2016   HGB 13.2 10/16/2016   HCT 39.4 10/16/2016   MCV 92.0 10/16/2016   PLT 260 10/16/2016      Chemistry      Component Value Date/Time   NA 139 10/16/2016 1304   K 4.0 10/16/2016 1304   CL 95 (L) 04/14/2016 0615   CO2 26 10/16/2016 1304   BUN 11.9 10/16/2016 1304   CREATININE 1.0 10/16/2016 1304      Component Value Date/Time   CALCIUM 8.4 10/16/2016 1304   ALKPHOS 131 10/16/2016 1304   AST 14 10/16/2016 1304   ALT 9 10/16/2016 1304   BILITOT 0.37 10/16/2016 1304       RADIOGRAPHIC STUDIES: No results found.  ASSESSMENT AND PLAN:  This is a very pleasant 56 years old African-American male with a stage IIIa non-small cell lung cancer, squamous cell carcinoma presented with large obstructing left upper lobe lung mass in addition to mediastinal lymphadenopathy diagnosed in February 2017 status post concurrent chemoradiation followed by consolidation chemotherapy. He has been on observation for the last 6 months and his most recent CT scan of the chest in February 2018 showed no evidence for disease progression. I recommended for the patient to continue on observation with repeat CT scan of the chest in 3 months. For smoking cessation, strongly encouraged the patient to quit smoking and offered him guidance about smoke cessation. He was advised to call immediately if he has any concerning symptoms in the interval. The patient voices understanding of current disease status and treatment options and is in agreement with the current care plan.  All questions were answered. The patient knows to call the clinic with any problems, questions or concerns. We can certainly see the patient much sooner if necessary. I spent 10 minutes counseling the patient face to face. The total time spent in the appointment  was 15 minutes.  Disclaimer: This note was dictated with voice recognition software. Similar sounding words can inadvertently be transcribed and may not be corrected upon review.

## 2016-10-23 NOTE — Patient Instructions (Signed)
Steps to Quit Smoking Smoking tobacco can be bad for your health. It can also affect almost every organ in your body. Smoking puts you and people around you at risk for many serious long-lasting (chronic) diseases. Quitting smoking is hard, but it is one of the best things that you can do for your health. It is never too late to quit. What are the benefits of quitting smoking? When you quit smoking, you lower your risk for getting serious diseases and conditions. They can include:  Lung cancer or lung disease.  Heart disease.  Stroke.  Heart attack.  Not being able to have children (infertility).  Weak bones (osteoporosis) and broken bones (fractures). If you have coughing, wheezing, and shortness of breath, those symptoms may get better when you quit. You may also get sick less often. If you are pregnant, quitting smoking can help to lower your chances of having a baby of low birth weight. What can I do to help me quit smoking? Talk with your doctor about what can help you quit smoking. Some things you can do (strategies) include:  Quitting smoking totally, instead of slowly cutting back how much you smoke over a period of time.  Going to in-person counseling. You are more likely to quit if you go to many counseling sessions.  Using resources and support systems, such as:  Online chats with a counselor.  Phone quitlines.  Printed self-help materials.  Support groups or group counseling.  Text messaging programs.  Mobile phone apps or applications.  Taking medicines. Some of these medicines may have nicotine in them. If you are pregnant or breastfeeding, do not take any medicines to quit smoking unless your doctor says it is okay. Talk with your doctor about counseling or other things that can help you. Talk with your doctor about using more than one strategy at the same time, such as taking medicines while you are also going to in-person counseling. This can help make quitting  easier. What things can I do to make it easier to quit? Quitting smoking might feel very hard at first, but there is a lot that you can do to make it easier. Take these steps:  Talk to your family and friends. Ask them to support and encourage you.  Call phone quitlines, reach out to support groups, or work with a counselor.  Ask people who smoke to not smoke around you.  Avoid places that make you want (trigger) to smoke, such as:  Bars.  Parties.  Smoke-break areas at work.  Spend time with people who do not smoke.  Lower the stress in your life. Stress can make you want to smoke. Try these things to help your stress:  Getting regular exercise.  Deep-breathing exercises.  Yoga.  Meditating.  Doing a body scan. To do this, close your eyes, focus on one area of your body at a time from head to toe, and notice which parts of your body are tense. Try to relax the muscles in those areas.  Download or buy apps on your mobile phone or tablet that can help you stick to your quit plan. There are many free apps, such as QuitGuide from the CDC (Centers for Disease Control and Prevention). You can find more support from smokefree.gov and other websites. This information is not intended to replace advice given to you by your health care provider. Make sure you discuss any questions you have with your health care provider. Document Released: 04/28/2009 Document Revised: 02/28/2016 Document   Reviewed: 11/16/2014 Elsevier Interactive Patient Education  2017 [employer]  

## 2016-10-24 ENCOUNTER — Telehealth: Payer: Self-pay | Admitting: Internal Medicine

## 2016-10-24 NOTE — Telephone Encounter (Signed)
Scheduled appt per 4/9 los. 3 month f/u with CT - Appt letter with appt time and date sent out.

## 2016-12-31 IMAGING — PT NM PET TUM IMG INITIAL (PI) SKULL BASE T - THIGH
9 series · 25 of 25 positions shown · non-contrast
Comparison: Chest CT 10/24/2015.  Chest CT 09/08/2015.

CLINICAL DATA: Subsequent treatment strategy for lung cancer.

EXAM:
NUCLEAR MEDICINE PET SKULL BASE TO THIGH
TECHNIQUE: 6.8 mCi F-18 FDG was injected intravenously. Full-ring PET imaging
was performed from the skull base to thigh after the radiotracer. CT
data was obtained and used for attenuation correction and anatomic
localization.
FASTING BLOOD GLUCOSE:  Value: 89 Mg/dl

[Series 3: pet sk_thigh ac · axial · 5.0mm · 4.07mm/px · z∈[-754,+118]mm · 4 of 219 slices shown]
[im 1/219]
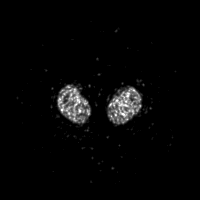
[im 73/219]
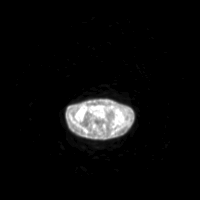
[im 146/219]
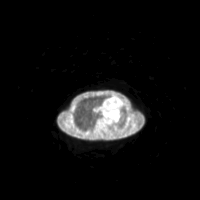
[im 219/219]
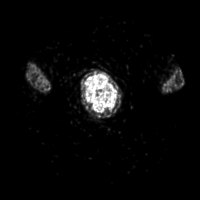

[Series 4: ct sk_thigh 5.0 b31f · axial · 5.0mm · 0.78mm/px · z∈[-754,+118]mm · 5 of 219 slices shown]
[im 1/219]
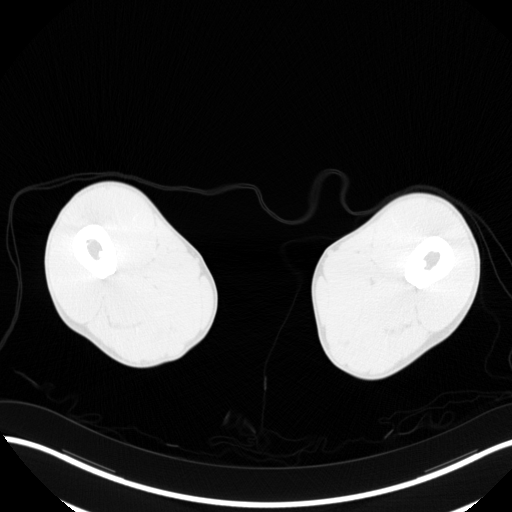
[im 55/219]
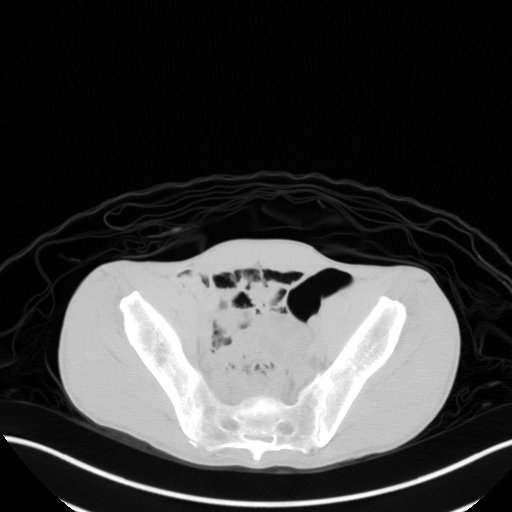
[im 110/219]
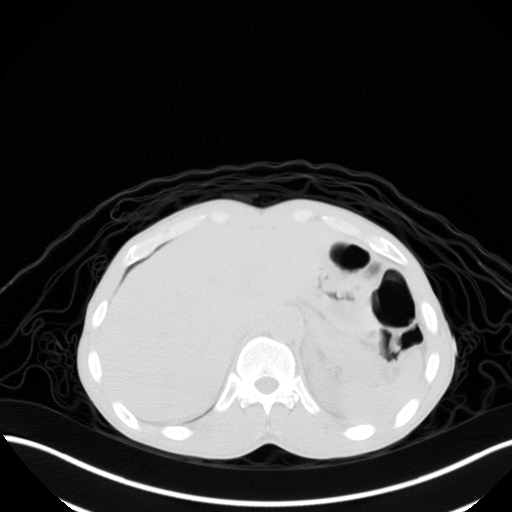
[im 164/219]
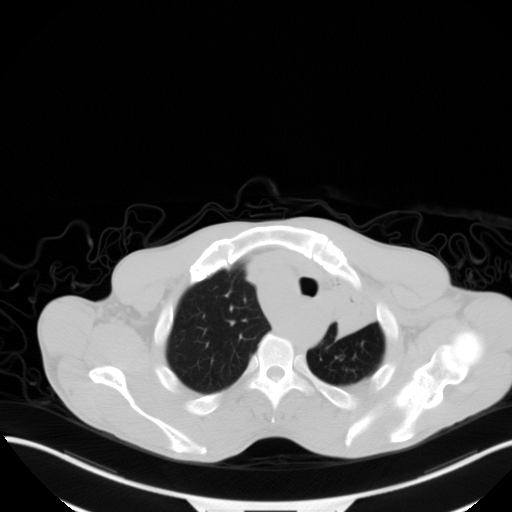
[im 219/219  brain]
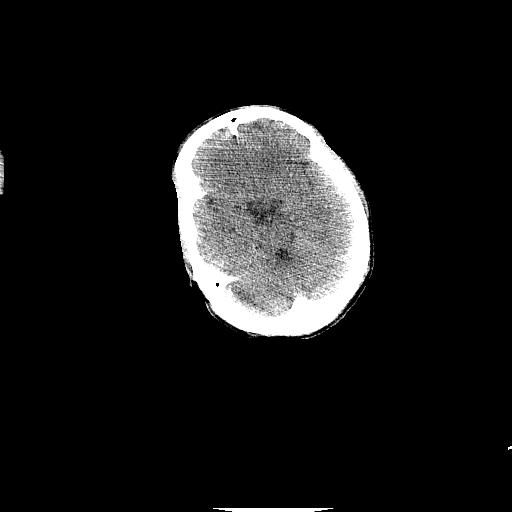

[Series 6: ct sk_thigh 5.0 b70f (id)_bone · axial · 5.0mm · 0.66mm/px · z∈[-322,-38]mm · 2 of 72 slices shown]
[im 1/72  bone]
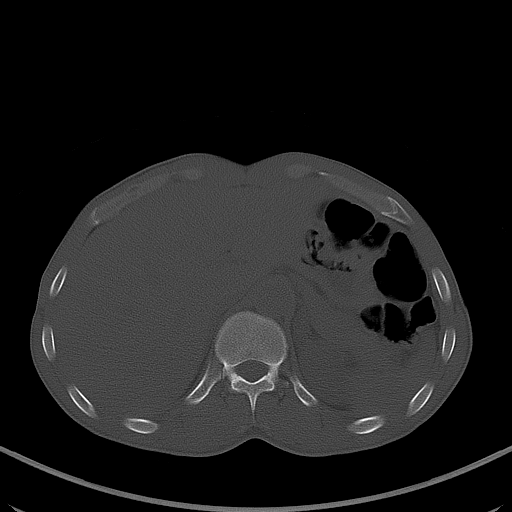
[im 72/72  bone]
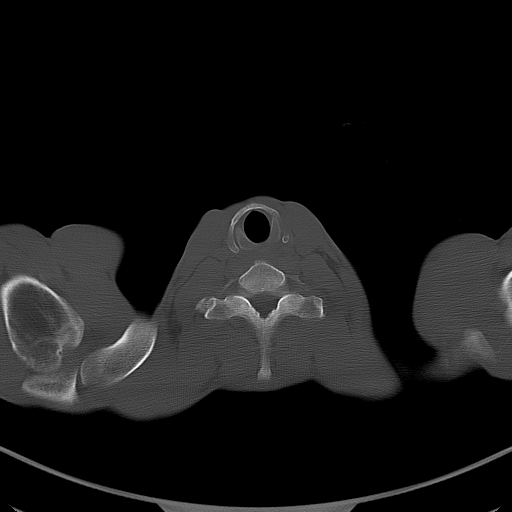

[Series 8: pet sk_thigh nac · axial · 5.0mm · 4.07mm/px · z∈[-754,+118]mm · 5 of 219 slices shown]
[im 1/219]
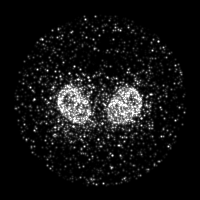
[im 55/219]
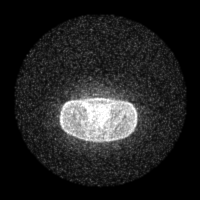
[im 110/219]
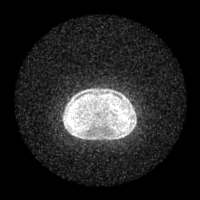
[im 164/219]
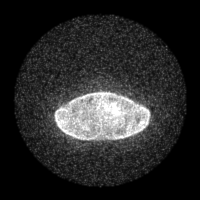
[im 219/219]
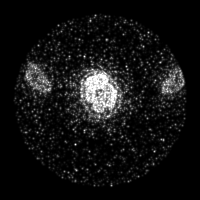

[Series 604: range-ct sk_thigh 5.0 (id)<alpha range> · 1 of 43 slices shown (1 of 2)]
[im 1/43]
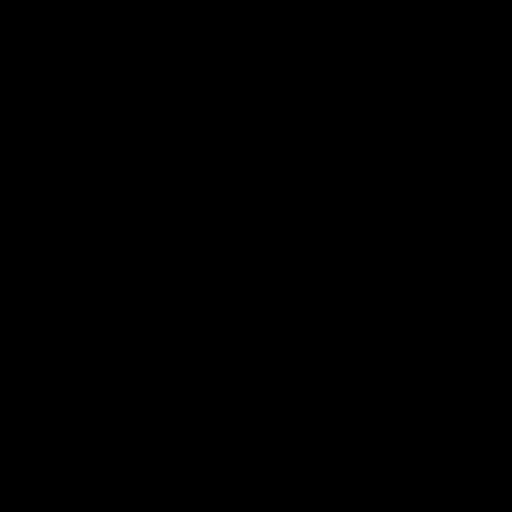

[Series 605: mip collection<mip range> · coronal · 1.81mm/px · 1 of 32 slices shown]
[im 1/32]
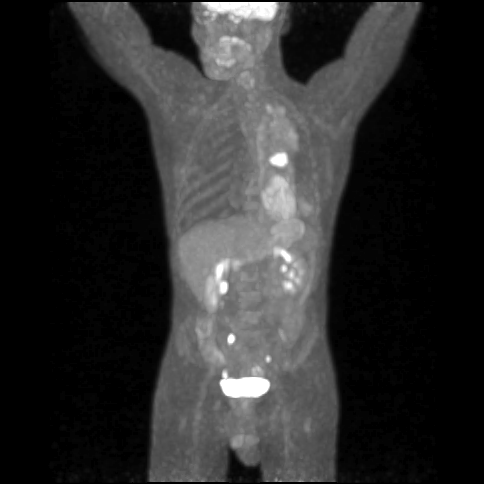

[Series 606: range-ct sk_thigh 5.0 (id)<alpha range> · 5 of 203 slices shown (2 of 2)]
[im 1/203]
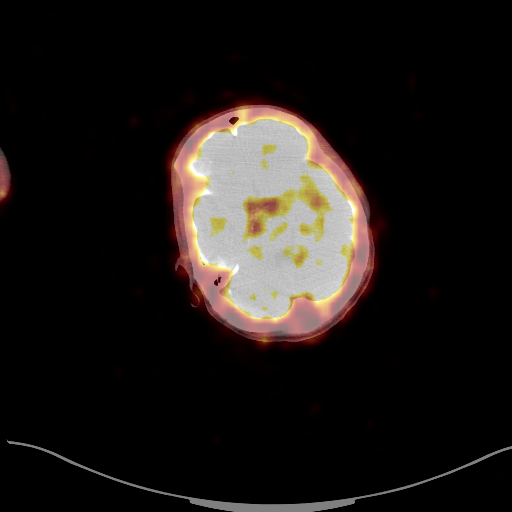
[im 51/203]
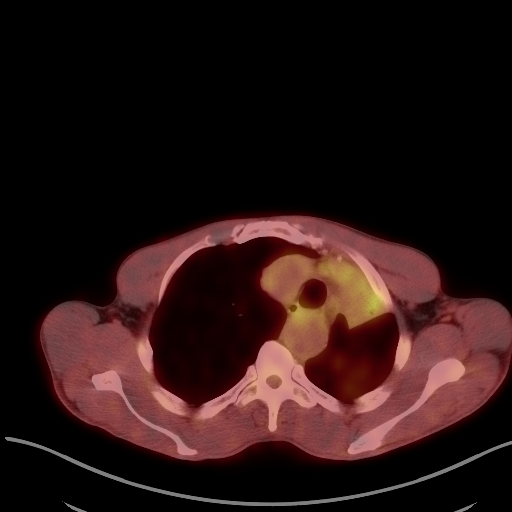
[im 102/203]
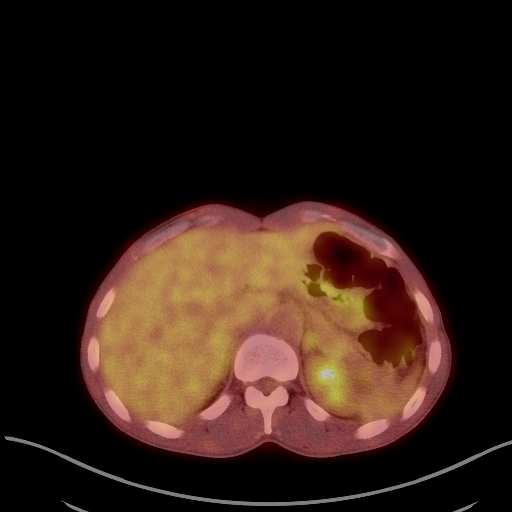
[im 152/203]
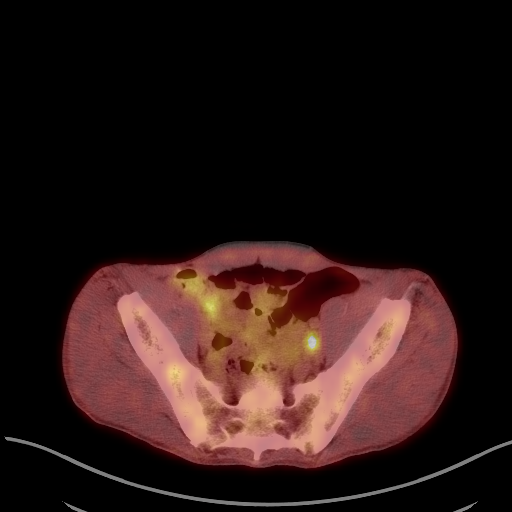
[im 203/203]
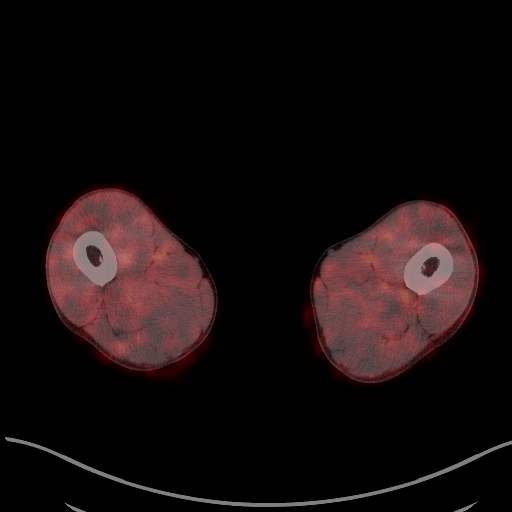

[Series 1032: results mm oncology reading · 5.0mm · 0.95mm/px · 1 of 6 slices shown (1 of 2)]
[im 1/6]
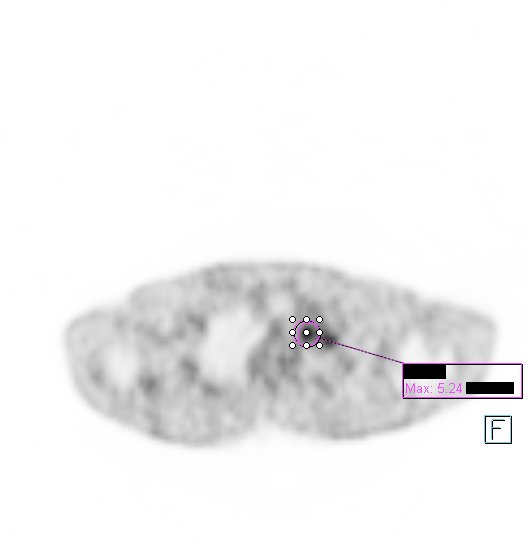

[Series 1150: results mm oncology reading · 5.0mm · 1.06mm/px · 1 of 1 slices shown (2 of 2)]
[im 1/1]
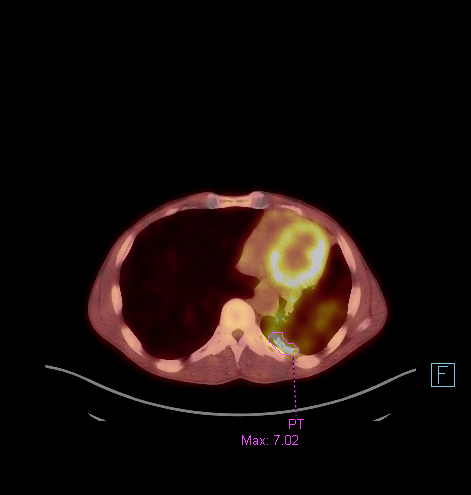

[25 of 25 positions shown; findings below may reference images not displayed]

FINDINGS: NECK

No hypermetabolic lymph nodes in the neck.

CHEST

The dominant hypermetabolic lesion on today's study is in the left
hilum with SUV max = 18.2. Although the hilar regions are difficult
to assess given the lack of intravenous contrast material, this
roughly corresponds to a 3.0 x 2.9 cm left hilar lesion, better
measured on the previous study with contrast material at 3.7 x
cm. This lesion includes a left mainstem bronchus. There is
associated left upper lobe collapse. Hypermetabolic focus in the
region of the left thoracic inlet has SUV max = 5.2 although no
underlying lesion cannot be identified on the CT images today.
Patchy airspace disease is seen in the left lower lobe with
confluent consolidation posteriorly (image 55 series 6). This
confluent posterior airspace disease in the lower lobe is
hypermetabolic with SUV max = 7.

Uptake in the distal esophagus is considered physiologic.

ABDOMEN/PELVIS

Potential low level uptake is identified in the left adrenal gland
with SUV max = 3.5. No discrete adrenal nodule by CT imaging. Foci
of uptake along the pelvic sidewall bilaterally without underlying
CT correlate felt to represent activity within the ureters.

SKELETON

No focal hypermetabolic activity to suggest skeletal metastasis.
IMPRESSION: Markedly hypermetabolic left hilar mass with very obstruction and
left upper lobe collapse. This is assisted with hyper metabolism in
the region of the left thoracic inlet hands associated with patchy
opacity in the posterior left lower lobe. The lower lobe disease may
be related to infection/inflammation, but the uptake is relatively
high level and tumor spread would be a consideration.

Apparent low level uptake in the left adrenal gland without an
underlying nodule evident by CT imaging. Close attention on
follow-up recommended.

## 2017-01-21 ENCOUNTER — Other Ambulatory Visit (HOSPITAL_BASED_OUTPATIENT_CLINIC_OR_DEPARTMENT_OTHER): Payer: BLUE CROSS/BLUE SHIELD

## 2017-01-21 ENCOUNTER — Other Ambulatory Visit: Payer: BLUE CROSS/BLUE SHIELD

## 2017-01-21 DIAGNOSIS — C3412 Malignant neoplasm of upper lobe, left bronchus or lung: Secondary | ICD-10-CM

## 2017-01-21 DIAGNOSIS — Z5111 Encounter for antineoplastic chemotherapy: Secondary | ICD-10-CM

## 2017-01-21 LAB — COMPREHENSIVE METABOLIC PANEL
ALBUMIN: 3.5 g/dL (ref 3.5–5.0)
ALK PHOS: 130 U/L (ref 40–150)
ALT: 11 U/L (ref 0–55)
ANION GAP: 11 meq/L (ref 3–11)
AST: 19 U/L (ref 5–34)
BILIRUBIN TOTAL: 0.24 mg/dL (ref 0.20–1.20)
BUN: 10.1 mg/dL (ref 7.0–26.0)
CALCIUM: 8.8 mg/dL (ref 8.4–10.4)
CO2: 26 mEq/L (ref 22–29)
Chloride: 104 mEq/L (ref 98–109)
Creatinine: 1 mg/dL (ref 0.7–1.3)
Glucose: 98 mg/dl (ref 70–140)
Potassium: 4.2 mEq/L (ref 3.5–5.1)
Sodium: 141 mEq/L (ref 136–145)
Total Protein: 8 g/dL (ref 6.4–8.3)

## 2017-01-21 LAB — CBC WITH DIFFERENTIAL/PLATELET
BASO%: 1.5 % (ref 0.0–2.0)
Basophils Absolute: 0 10*3/uL (ref 0.0–0.1)
EOS ABS: 0.1 10*3/uL (ref 0.0–0.5)
EOS%: 5.3 % (ref 0.0–7.0)
HCT: 43.4 % (ref 38.4–49.9)
HGB: 14.5 g/dL (ref 13.0–17.1)
LYMPH%: 42 % (ref 14.0–49.0)
MCH: 30.4 pg (ref 27.2–33.4)
MCHC: 33.3 g/dL (ref 32.0–36.0)
MCV: 91.3 fL (ref 79.3–98.0)
MONO#: 0.4 10*3/uL (ref 0.1–0.9)
MONO%: 13.6 % (ref 0.0–14.0)
NEUT%: 37.6 % — ABNORMAL LOW (ref 39.0–75.0)
NEUTROS ABS: 1 10*3/uL — AB (ref 1.5–6.5)
PLATELETS: 204 10*3/uL (ref 140–400)
RBC: 4.76 10*6/uL (ref 4.20–5.82)
RDW: 14.8 % — ABNORMAL HIGH (ref 11.0–14.6)
WBC: 2.6 10*3/uL — ABNORMAL LOW (ref 4.0–10.3)
lymph#: 1.1 10*3/uL (ref 0.9–3.3)

## 2017-01-23 ENCOUNTER — Encounter: Payer: Self-pay | Admitting: Internal Medicine

## 2017-01-23 ENCOUNTER — Other Ambulatory Visit: Payer: BLUE CROSS/BLUE SHIELD

## 2017-01-23 ENCOUNTER — Ambulatory Visit (HOSPITAL_BASED_OUTPATIENT_CLINIC_OR_DEPARTMENT_OTHER): Payer: BLUE CROSS/BLUE SHIELD | Admitting: Internal Medicine

## 2017-01-23 ENCOUNTER — Ambulatory Visit (HOSPITAL_COMMUNITY)
Admission: RE | Admit: 2017-01-23 | Discharge: 2017-01-23 | Disposition: A | Discharge status: Home / Self Care | Payer: BLUE CROSS/BLUE SHIELD | Source: Ambulatory Visit | Attending: Internal Medicine | Admitting: Internal Medicine

## 2017-01-23 ENCOUNTER — Encounter (HOSPITAL_COMMUNITY): Payer: Self-pay

## 2017-01-23 VITALS — BP 119/73 | HR 70 | Temp 98.0°F | Resp 20 | Ht 73.0 in | Wt 139.9 lb

## 2017-01-23 DIAGNOSIS — Z5111 Encounter for antineoplastic chemotherapy: Secondary | ICD-10-CM

## 2017-01-23 DIAGNOSIS — C771 Secondary and unspecified malignant neoplasm of intrathoracic lymph nodes: Secondary | ICD-10-CM

## 2017-01-23 DIAGNOSIS — C3412 Malignant neoplasm of upper lobe, left bronchus or lung: Secondary | ICD-10-CM

## 2017-01-23 DIAGNOSIS — F1290 Cannabis use, unspecified, uncomplicated: Secondary | ICD-10-CM

## 2017-01-23 DIAGNOSIS — Z720 Tobacco use: Secondary | ICD-10-CM | POA: Diagnosis not present

## 2017-01-23 DIAGNOSIS — F17200 Nicotine dependence, unspecified, uncomplicated: Secondary | ICD-10-CM

## 2017-01-23 DIAGNOSIS — D709 Neutropenia, unspecified: Secondary | ICD-10-CM

## 2017-01-23 DIAGNOSIS — Z923 Personal history of irradiation: Secondary | ICD-10-CM | POA: Insufficient documentation

## 2017-01-23 MED ORDER — IOPAMIDOL (ISOVUE-300) INJECTION 61%
75.0000 mL | Freq: Once | INTRAVENOUS | Status: AC | PRN
  Administered 2017-01-23: 75 mL via INTRAVENOUS

## 2017-01-23 MED ORDER — IOPAMIDOL (ISOVUE-300) INJECTION 61%
INTRAVENOUS | Status: AC
  Filled 2017-01-23: qty 75

## 2017-01-23 NOTE — Patient Instructions (Signed)
Steps to Quit Smoking Smoking tobacco can be bad for your health. It can also affect almost every organ in your body. Smoking puts you and people around you at risk for many serious long-lasting (chronic) diseases. Quitting smoking is hard, but it is one of the best things that you can do for your health. It is never too late to quit. What are the benefits of quitting smoking? When you quit smoking, you lower your risk for getting serious diseases and conditions. They can include:  Lung cancer or lung disease.  Heart disease.  Stroke.  Heart attack.  Not being able to have children (infertility).  Weak bones (osteoporosis) and broken bones (fractures).  If you have coughing, wheezing, and shortness of breath, those symptoms may get better when you quit. You may also get sick less often. If you are pregnant, quitting smoking can help to lower your chances of having a baby of low birth weight. What can I do to help me quit smoking? Talk with your doctor about what can help you quit smoking. Some things you can do (strategies) include:  Quitting smoking totally, instead of slowly cutting back how much you smoke over a period of time.  Going to in-person counseling. You are more likely to quit if you go to many counseling sessions.  Using resources and support systems, such as: ? Online chats with a counselor. ? Phone quitlines. ? Printed self-help materials. ? Support groups or group counseling. ? Text messaging programs. ? Mobile phone apps or applications.  Taking medicines. Some of these medicines may have nicotine in them. If you are pregnant or breastfeeding, do not take any medicines to quit smoking unless your doctor says it is okay. Talk with your doctor about counseling or other things that can help you.  Talk with your doctor about using more than one strategy at the same time, such as taking medicines while you are also going to in-person counseling. This can help make  quitting easier. What things can I do to make it easier to quit? Quitting smoking might feel very hard at first, but there is a lot that you can do to make it easier. Take these steps:  Talk to your family and friends. Ask them to support and encourage you.  Call phone quitlines, reach out to support groups, or work with a counselor.  Ask people who smoke to not smoke around you.  Avoid places that make you want (trigger) to smoke, such as: ? Bars. ? Parties. ? Smoke-break areas at work.  Spend time with people who do not smoke.  Lower the stress in your life. Stress can make you want to smoke. Try these things to help your stress: ? Getting regular exercise. ? Deep-breathing exercises. ? Yoga. ? Meditating. ? Doing a body scan. To do this, close your eyes, focus on one area of your body at a time from head to toe, and notice which parts of your body are tense. Try to relax the muscles in those areas.  Download or buy apps on your mobile phone or tablet that can help you stick to your quit plan. There are many free apps, such as QuitGuide from the CDC (Centers for Disease Control and Prevention). You can find more support from smokefree.gov and other websites.  This information is not intended to replace advice given to you by your health care provider. Make sure you discuss any questions you have with your health care provider. Document Released: 04/28/2009 Document   Revised: 02/28/2016 Document Reviewed: 11/16/2014 Elsevier Interactive Patient Education  2018 Elsevier Inc.  

## 2017-01-23 NOTE — Progress Notes (Signed)
Maurice Mcknight:(336) [phone]   Fax:(336) [phone]  OFFICE PROGRESS NOTE  Patient, No Pcp Per No address on file  DIAGNOSIS: Stage IIIA (T2a, N2, M0) non-small cell lung cancer, squamous cell carcinoma diagnosed in February 2017 and presented with left upper lobe obstructing mass as well as questionable periaortic lymphadenopathy.  PRIOR THERAPY:  1) Concurrent chemoradiation with weekly carboplatin for AUC of 2 and paclitaxel 45 MG/M2. First treatment was given on 10/10/2015. Status post 5 weeks of treatment. Last dose was given 11/14/2015 with partial response. 2) Consolidation systemic chemotherapy with carboplatin for AUC of 5 and paclitaxel 175 MG/M2 every 3 weeks with Neulasta support. First dose 01/25/2016. Status post 3 cycles.  CURRENT THERAPY: Observation.  INTERVAL HISTORY: Maurice Mcknight 56 y.o. male returns to the clinic today for follow-up visit. The patient is feeling fine today with no specific complaints. He is currently on observation and feeling fine. He denied having any chest pain, shortness of breath, cough or hemoptysis. Unfortunately he continues to smoke and he also using illicit drugs including marijuana. He denied having any fever or chills. He has no nausea, vomiting, diarrhea or constipation. He also use over-the-counter NSAIDs as needed. He had repeat CT scan of the chest performed recently and he is here for evaluation and discussion of his scan results.   MEDICAL HISTORY: Past Medical History:  Diagnosis Date  . Encounter for antineoplastic chemotherapy 10/17/2015  . Fever and chills 04/11/2016  . lung ca dx'd 09/2015  . Malnutrition ([place]) 09/28/2015  . Needs smoking cessation education 09/28/2015  . Radiation 10/17/15-11/25/15   left chest 60 Gy    ALLERGIES:  has No Known Allergies.  MEDICATIONS:  Current Outpatient Prescriptions  Medication Sig Dispense Refill  . ibuprofen (ADVIL,MOTRIN) 200 MG tablet Take 800 mg by mouth  every 6 (six) hours as needed for moderate pain. Reported on 01/05/2016    . naproxen sodium (ANAPROX) 220 MG tablet Take 220-440 mg by mouth every 12 (twelve) hours as needed (for pain).    . ranitidine (ZANTAC) 150 MG capsule Take 1 capsule (150 mg total) by mouth daily. 30 capsule 0  . sucralfate (CARAFATE) 1 GM/10ML suspension Take 10 mLs (1 g total) by mouth 4 (four) times daily -  with meals and at bedtime. 420 mL 0  . prochlorperazine (COMPAZINE) 10 MG tablet Take 1 tablet (10 mg total) by mouth every 6 (six) hours as needed for nausea or vomiting. (Patient not taking: Reported on 01/23/2017) 30 tablet 0   No current facility-administered medications for this visit.    Facility-Administered Medications Ordered in Other Visits  Medication Dose Route Frequency Provider Last Rate Last Dose  . diphenhydrAMINE (BENADRYL) injection 50 mg  50 mg Intravenous Once Curt Bears, MD      . iopamidol (ISOVUE-300) 61 % injection             SURGICAL HISTORY:  Past Surgical History:  Procedure Laterality Date  . VIDEO BRONCHOSCOPY Bilateral 09/09/2015   Diagnosis    REVIEW OF SYSTEMS:  A comprehensive review of systems was negative.   PHYSICAL EXAMINATION: General appearance: alert, cooperative and no distress Head: Normocephalic, without obvious abnormality, atraumatic Neck: no adenopathy, no JVD, supple, symmetrical, trachea midline and thyroid not enlarged, symmetric, no tenderness/mass/nodules Lymph nodes: Cervical, supraclavicular, and axillary nodes normal. Resp: clear to auscultation bilaterally Back: symmetric, no curvature. ROM normal. No CVA tenderness. Cardio: regular rate and rhythm, S1, S2 normal, no murmur, click,  rub or gallop GI: soft, non-tender; bowel sounds normal; no masses,  no organomegaly Extremities: extremities normal, atraumatic, no cyanosis or edema  ECOG PERFORMANCE STATUS: 0 - Asymptomatic  Blood pressure 119/73, pulse 70, temperature 98 F (36.7 C),  temperature source Oral, resp. rate 20, height 6\' 1"  (1.854 m), weight 139 lb 14.4 oz (63.5 kg), SpO2 100 %.  LABORATORY DATA: Lab Results  Component Value Date   WBC 2.6 (L) 01/21/2017   HGB 14.5 01/21/2017   HCT 43.4 01/21/2017   MCV 91.3 01/21/2017   PLT 204 01/21/2017      Chemistry      Component Value Date/Time   NA 141 01/21/2017 0753   K 4.2 01/21/2017 0753   CL 95 (L) 04/14/2016 0615   CO2 26 01/21/2017 0753   BUN 10.1 01/21/2017 0753   CREATININE 1.0 01/21/2017 0753      Component Value Date/Time   CALCIUM 8.8 01/21/2017 0753   ALKPHOS 130 01/21/2017 0753   AST 19 01/21/2017 0753   ALT 11 01/21/2017 0753   BILITOT 0.24 01/21/2017 0753       RADIOGRAPHIC STUDIES: Ct Chest W Contrast  Result Date: 01/23/2017 CLINICAL DATA:  Followup left upper lobe lung carcinoma. Encounter for antineoplastic chemotherapy. Previous radiation therapy. Chronic cough. EXAM: CT CHEST WITH CONTRAST TECHNIQUE: Multidetector CT imaging of the chest was performed during intravenous contrast administration. CONTRAST:  74mL ISOVUE-300 IOPAMIDOL (ISOVUE-300) INJECTION 61% COMPARISON:  03/11/2017 FINDINGS: Cardiovascular: No acute findings. Right-sided aortic arch again noted. Mediastinum/Nodes: No masses or pathologically enlarged lymph nodes identified. Lungs/Pleura: Stable post radiation changes in left paramediastinal lung zone. No evidence of suspicious pulmonary nodules or masses. No evidence of acute infiltrate or pleural effusion. Upper Abdomen:  Unremarkable. Musculoskeletal:  No suspicious bone lesions. IMPRESSION: Stable left lung post radiation changes. No evidence of recurrent or metastatic carcinoma. Electronically Signed   By: Earle Gell M.D.   On: 01/23/2017 09:08    ASSESSMENT AND PLAN:  This is a very pleasant 56 years old African-American male with a stage IIIa non-small cell lung cancer, squamous cell carcinoma presented with large obstructing left upper lobe lung mass in  addition to mediastinal lymphadenopathy diagnosed in February 2017 status post concurrent chemoradiation followed by consolidation chemotherapy. The patient has been observation since August 2017 with no complaints. His recent CT scan of the chest showed no evidence for disease progression. I discussed the scan results with the patient today and recommended for him to continue on observation with repeat CT scan of the chest in 6 months. I strongly encouraged the patient to quit smoking and avoid drug abuse. For the neutropenia, I advised the patient to avoid any over-the-counter NSAIDs. He was advised to call immediately if he has any concerning symptoms in the interval. The patient voices understanding of current disease status and treatment options and is in agreement with the current care plan. All questions were answered. The patient knows to call the clinic with any problems, questions or concerns. We can certainly see the patient much sooner if necessary. I spent 10 minutes counseling the patient face to face. The total time spent in the appointment was 15 minutes.  Disclaimer: This note was dictated with voice recognition software. Similar sounding words can inadvertently be transcribed and may not be corrected upon review.

## 2017-01-24 ENCOUNTER — Telehealth: Payer: Self-pay | Admitting: Internal Medicine

## 2017-01-24 NOTE — Telephone Encounter (Signed)
Mailed appt for Jan 2019.

## 2017-01-24 NOTE — Telephone Encounter (Signed)
Mailed appts for Jan 2019.

## 2017-04-04 ENCOUNTER — Telehealth: Payer: Self-pay | Admitting: Internal Medicine

## 2017-04-04 NOTE — Telephone Encounter (Signed)
Faxed office notes to [facility] [place] [phone]

## 2017-04-22 ENCOUNTER — Telehealth: Payer: Self-pay | Admitting: Internal Medicine

## 2017-04-22 NOTE — Telephone Encounter (Signed)
Faxed records to [facility]

## 2017-07-23 ENCOUNTER — Inpatient Hospital Stay: Payer: BLUE CROSS/BLUE SHIELD | Attending: Internal Medicine

## 2017-07-23 DIAGNOSIS — C3411 Malignant neoplasm of upper lobe, right bronchus or lung: Secondary | ICD-10-CM | POA: Insufficient documentation

## 2017-07-23 DIAGNOSIS — F17200 Nicotine dependence, unspecified, uncomplicated: Secondary | ICD-10-CM

## 2017-07-23 DIAGNOSIS — C3412 Malignant neoplasm of upper lobe, left bronchus or lung: Secondary | ICD-10-CM

## 2017-07-23 LAB — CBC WITH DIFFERENTIAL/PLATELET
Abs Granulocyte: 1.3 10*3/uL — ABNORMAL LOW (ref 1.5–6.5)
BASOS PCT: 1 %
Basophils Absolute: 0 10*3/uL (ref 0.0–0.1)
EOS ABS: 0.1 10*3/uL (ref 0.0–0.5)
EOS PCT: 4 %
HCT: 44.8 % (ref 38.4–49.9)
Hemoglobin: 14.8 g/dL (ref 13.0–17.1)
LYMPHS ABS: 1.2 10*3/uL (ref 0.9–3.3)
Lymphocytes Relative: 40 %
MCH: 31.2 pg (ref 27.2–33.4)
MCHC: 33 g/dL (ref 32.0–36.0)
MCV: 94.7 fL (ref 79.3–98.0)
MONOS PCT: 14 %
Monocytes Absolute: 0.4 10*3/uL (ref 0.1–0.9)
Neutro Abs: 1.3 10*3/uL — ABNORMAL LOW (ref 1.5–6.5)
Neutrophils Relative %: 41 %
PLATELETS: 210 10*3/uL (ref 140–400)
RBC: 4.73 MIL/uL (ref 4.20–5.82)
RDW: 13.1 % (ref 11.0–15.6)
WBC: 3.1 10*3/uL — ABNORMAL LOW (ref 4.0–10.3)

## 2017-07-23 LAB — COMPREHENSIVE METABOLIC PANEL
ALBUMIN: 3.7 g/dL (ref 3.5–5.0)
ALK PHOS: 101 U/L (ref 40–150)
ALT: 13 U/L (ref 0–55)
ANION GAP: 6 (ref 3–11)
AST: 20 U/L (ref 5–34)
BILIRUBIN TOTAL: 0.6 mg/dL (ref 0.2–1.2)
BUN: 13 mg/dL (ref 7–26)
CO2: 29 mmol/L (ref 22–29)
CREATININE: 1.21 mg/dL (ref 0.70–1.30)
Calcium: 8.9 mg/dL (ref 8.4–10.4)
Chloride: 103 mmol/L (ref 98–109)
GFR calc non Af Amer: >60 mL/min (ref >60)
GLUCOSE: 106 mg/dL (ref 70–140)
Potassium: 4 mmol/L (ref 3.5–5.1)
SODIUM: 138 mmol/L (ref 136–145)
TOTAL PROTEIN: 7.6 g/dL (ref 6.4–8.3)

## 2017-07-30 ENCOUNTER — Ambulatory Visit (HOSPITAL_COMMUNITY): Payer: BLUE CROSS/BLUE SHIELD

## 2017-07-30 ENCOUNTER — Ambulatory Visit: Payer: BLUE CROSS/BLUE SHIELD | Admitting: Internal Medicine

## 2017-07-30 ENCOUNTER — Encounter: Payer: Self-pay | Admitting: *Deleted

## 2017-07-30 NOTE — Progress Notes (Signed)
Oncology Nurse Navigator Documentation  Oncology Nurse Navigator Flowsheets 07/30/2017  Navigator Location [facility]  Navigator Encounter Type Other/patient was a no show today and missed his follow up scan as well. Patient is now scheduled for scan on 07/31/17. I will place scheduling message to have patient come back to be seen with [name] or Mikey Bussing next week for follow up.   Treatment Phase Follow-up  Barriers/Navigation Needs Coordination of Care  Interventions Coordination of Care  Coordination of Care Other  Acuity Level 2  Time Spent with Patient 30

## 2017-07-31 ENCOUNTER — Telehealth: Payer: Self-pay | Admitting: Internal Medicine

## 2017-07-31 ENCOUNTER — Encounter (HOSPITAL_COMMUNITY): Payer: Self-pay

## 2017-07-31 ENCOUNTER — Ambulatory Visit (HOSPITAL_COMMUNITY)
Admission: RE | Admit: 2017-07-31 | Discharge: 2017-07-31 | Disposition: A | Discharge status: Home / Self Care | Payer: BLUE CROSS/BLUE SHIELD | Source: Ambulatory Visit | Attending: Internal Medicine | Admitting: Internal Medicine

## 2017-07-31 DIAGNOSIS — Q278 Other specified congenital malformations of peripheral vascular system: Secondary | ICD-10-CM | POA: Insufficient documentation

## 2017-07-31 DIAGNOSIS — I2510 Atherosclerotic heart disease of native coronary artery without angina pectoris: Secondary | ICD-10-CM | POA: Insufficient documentation

## 2017-07-31 DIAGNOSIS — F17200 Nicotine dependence, unspecified, uncomplicated: Secondary | ICD-10-CM

## 2017-07-31 DIAGNOSIS — K228 Other specified diseases of esophagus: Secondary | ICD-10-CM | POA: Insufficient documentation

## 2017-07-31 DIAGNOSIS — C3412 Malignant neoplasm of upper lobe, left bronchus or lung: Secondary | ICD-10-CM | POA: Insufficient documentation

## 2017-07-31 MED ORDER — IOPAMIDOL (ISOVUE-300) INJECTION 61%
INTRAVENOUS | Status: AC
  Filled 2017-07-31: qty 75

## 2017-07-31 MED ORDER — IOPAMIDOL (ISOVUE-300) INJECTION 61%
75.0000 mL | Freq: Once | INTRAVENOUS | Status: AC | PRN
  Administered 2017-07-31: 75 mL via INTRAVENOUS

## 2017-07-31 NOTE — Telephone Encounter (Signed)
Scheduled appt per  1/15 sch message - Patient is aware of appt date and time.

## 2017-08-06 ENCOUNTER — Ambulatory Visit: Payer: BLUE CROSS/BLUE SHIELD | Admitting: Internal Medicine

## 2017-08-06 ENCOUNTER — Other Ambulatory Visit: Payer: BLUE CROSS/BLUE SHIELD

## 2017-08-19 ENCOUNTER — Telehealth: Payer: Self-pay | Admitting: Internal Medicine

## 2017-08-19 NOTE — Telephone Encounter (Signed)
Scheduled appt per 2/1 sch message - left message with appt date and time and sent reminder letter in the mail.

## 2017-09-02 ENCOUNTER — Other Ambulatory Visit: Payer: Self-pay | Admitting: *Deleted

## 2017-09-02 DIAGNOSIS — C3412 Malignant neoplasm of upper lobe, left bronchus or lung: Secondary | ICD-10-CM

## 2017-09-03 ENCOUNTER — Inpatient Hospital Stay: Payer: Self-pay

## 2017-09-03 ENCOUNTER — Inpatient Hospital Stay: Payer: Self-pay | Attending: Internal Medicine | Admitting: Internal Medicine

## 2017-09-03 ENCOUNTER — Encounter: Payer: Self-pay | Admitting: Internal Medicine

## 2017-09-03 DIAGNOSIS — R0602 Shortness of breath: Secondary | ICD-10-CM | POA: Insufficient documentation

## 2017-09-03 DIAGNOSIS — Z923 Personal history of irradiation: Secondary | ICD-10-CM | POA: Insufficient documentation

## 2017-09-03 DIAGNOSIS — R599 Enlarged lymph nodes, unspecified: Secondary | ICD-10-CM | POA: Insufficient documentation

## 2017-09-03 DIAGNOSIS — Z9221 Personal history of antineoplastic chemotherapy: Secondary | ICD-10-CM | POA: Insufficient documentation

## 2017-09-03 DIAGNOSIS — R5383 Other fatigue: Secondary | ICD-10-CM | POA: Insufficient documentation

## 2017-09-03 DIAGNOSIS — C3412 Malignant neoplasm of upper lobe, left bronchus or lung: Secondary | ICD-10-CM | POA: Insufficient documentation

## 2017-09-03 DIAGNOSIS — C3490 Malignant neoplasm of unspecified part of unspecified bronchus or lung: Secondary | ICD-10-CM

## 2017-09-03 DIAGNOSIS — F17210 Nicotine dependence, cigarettes, uncomplicated: Secondary | ICD-10-CM | POA: Insufficient documentation

## 2017-09-03 DIAGNOSIS — Z79899 Other long term (current) drug therapy: Secondary | ICD-10-CM | POA: Insufficient documentation

## 2017-09-03 LAB — CBC WITH DIFFERENTIAL (CANCER CENTER ONLY)
BASOS ABS: 0 10*3/uL (ref 0.0–0.1)
BASOS PCT: 1 %
EOS ABS: 0.2 10*3/uL (ref 0.0–0.5)
EOS PCT: 5 %
HCT: 43.6 % (ref 38.4–49.9)
HEMOGLOBIN: 14.5 g/dL (ref 13.0–17.1)
Lymphocytes Relative: 46 %
Lymphs Abs: 1.8 10*3/uL (ref 0.9–3.3)
MCH: 31.7 pg (ref 27.2–33.4)
MCHC: 33.3 g/dL (ref 32.0–36.0)
MCV: 95.2 fL (ref 79.3–98.0)
Monocytes Absolute: 0.3 10*3/uL (ref 0.1–0.9)
Monocytes Relative: 8 %
NEUTROS PCT: 40 %
Neutro Abs: 1.5 10*3/uL (ref 1.5–6.5)
Platelet Count: 196 10*3/uL (ref 140–400)
RBC: 4.58 MIL/uL (ref 4.20–5.82)
RDW: 13.3 % (ref 11.0–14.6)
WBC: 3.8 10*3/uL — AB (ref 4.0–10.3)

## 2017-09-03 LAB — CMP (CANCER CENTER ONLY)
ALT: 14 U/L (ref 0–55)
AST: 21 U/L (ref 5–34)
Albumin: 3.6 g/dL (ref 3.5–5.0)
Alkaline Phosphatase: 103 U/L (ref 40–150)
Anion gap: 9 (ref 3–11)
BILIRUBIN TOTAL: 0.3 mg/dL (ref 0.2–1.2)
BUN: 15 mg/dL (ref 7–26)
CALCIUM: 8.7 mg/dL (ref 8.4–10.4)
CHLORIDE: 104 mmol/L (ref 98–109)
CO2: 27 mmol/L (ref 22–29)
CREATININE: 1.16 mg/dL (ref 0.70–1.30)
GFR, Est AFR Am: >60 mL/min (ref >60)
Glucose, Bld: 81 mg/dL (ref 70–140)
Potassium: 3.6 mmol/L (ref 3.5–5.1)
Sodium: 140 mmol/L (ref 136–145)
TOTAL PROTEIN: 7.6 g/dL (ref 6.4–8.3)

## 2017-09-03 NOTE — Progress Notes (Signed)
[place] Telephone:(336) [phone]   Fax:(336) [phone]  OFFICE PROGRESS NOTE  Patient, No Pcp Per No address on file  DIAGNOSIS: Stage IIIA (T2a, N2, M0) non-small cell lung cancer, squamous cell carcinoma diagnosed in February 2017 and presented with left upper lobe obstructing mass as well as questionable periaortic lymphadenopathy.  PRIOR THERAPY:  1) Concurrent chemoradiation with weekly carboplatin for AUC of 2 and paclitaxel 45 MG/M2. First treatment was given on 10/10/2015. Status post 5 weeks of treatment. Last dose was given 11/14/2015 with partial response. 2) Consolidation systemic chemotherapy with carboplatin for AUC of 5 and paclitaxel 175 MG/M2 every 3 weeks with Neulasta support. First dose 01/25/2016. Status post 3 cycles.  CURRENT THERAPY: Observation.  INTERVAL HISTORY: Maurice Mcknight 57 y.o. male returns to the clinic today for follow-up visit.  The patient is feeling very well today with no specific complaints except for mild fatigue as well as shortness of breath with exertion.  He denied having any chest pain but continues to have cough with no hemoptysis.  Unfortunately he continues to smoke at regular basis and I strongly recommended for him to quit smoking.  He denied having any recent weight loss or night sweats.  He has no nausea, vomiting, diarrhea or constipation.  He has no fever or chills.  The patient had repeat CT scan of the chest performed recently and is here for evaluation and discussion of his discuss results.    MEDICAL HISTORY: Past Medical History:  Diagnosis Date  . Encounter for antineoplastic chemotherapy 10/17/2015  . Fever and chills 04/11/2016  . lung ca dx'd 09/2015  . Malnutrition ([place]) 09/28/2015  . Needs smoking cessation education 09/28/2015  . Radiation 10/17/15-11/25/15   left chest 60 Gy    ALLERGIES:  has No Known Allergies.  MEDICATIONS:  Current Outpatient Medications  Medication Sig Dispense Refill  .  ibuprofen (ADVIL,MOTRIN) 200 MG tablet Take 800 mg by mouth every 6 (six) hours as needed for moderate pain. Reported on 01/05/2016    . naproxen sodium (ANAPROX) 220 MG tablet Take 220-440 mg by mouth every 12 (twelve) hours as needed (for pain).    . prochlorperazine (COMPAZINE) 10 MG tablet Take 1 tablet (10 mg total) by mouth every 6 (six) hours as needed for nausea or vomiting. (Patient not taking: Reported on 09/03/2017) 30 tablet 0  . ranitidine (ZANTAC) 150 MG capsule Take 1 capsule (150 mg total) by mouth daily. (Patient not taking: Reported on 09/03/2017) 30 capsule 0  . sucralfate (CARAFATE) 1 GM/10ML suspension Take 10 mLs (1 g total) by mouth 4 (four) times daily -  with meals and at bedtime. (Patient not taking: Reported on 09/03/2017) 420 mL 0   No current facility-administered medications for this visit.    Facility-Administered Medications Ordered in Other Visits  Medication Dose Route Frequency Provider Last Rate Last Dose  . diphenhydrAMINE (BENADRYL) injection 50 mg  50 mg Intravenous Once Curt Bears, MD        SURGICAL HISTORY:  Past Surgical History:  Procedure Laterality Date  . VIDEO BRONCHOSCOPY Bilateral 09/09/2015   Diagnosis    REVIEW OF SYSTEMS:  A comprehensive review of systems was negative except for: Respiratory: positive for cough and dyspnea on exertion   PHYSICAL EXAMINATION: General appearance: alert, cooperative and no distress Head: Normocephalic, without obvious abnormality, atraumatic Neck: no adenopathy, no JVD, supple, symmetrical, trachea midline and thyroid not enlarged, symmetric, no tenderness/mass/nodules Lymph nodes: Cervical, supraclavicular, and axillary  nodes normal. Resp: clear to auscultation bilaterally Back: symmetric, no curvature. ROM normal. No CVA tenderness. Cardio: regular rate and rhythm, S1, S2 normal, no murmur, click, rub or gallop GI: soft, non-tender; bowel sounds normal; no masses,  no organomegaly Extremities:  extremities normal, atraumatic, no cyanosis or edema  ECOG PERFORMANCE STATUS: 0 - Asymptomatic  Blood pressure 120/86, pulse (!) 59, temperature 97.9 F (36.6 C), temperature source Oral, resp. rate 18, height 6\' 1"  (1.854 m), weight 152 lb (68.9 kg), SpO2 100 %.  LABORATORY DATA: Lab Results  Component Value Date   WBC 3.8 (L) 09/03/2017   HGB 14.8 07/23/2017   HCT 43.6 09/03/2017   MCV 95.2 09/03/2017   PLT 196 09/03/2017      Chemistry      Component Value Date/Time   NA 138 07/23/2017 0755   NA 141 01/21/2017 0753   K 4.0 07/23/2017 0755   K 4.2 01/21/2017 0753   CL 103 07/23/2017 0755   CO2 29 07/23/2017 0755   CO2 26 01/21/2017 0753   BUN 13 07/23/2017 0755   BUN 10.1 01/21/2017 0753   CREATININE 1.21 07/23/2017 0755   CREATININE 1.0 01/21/2017 0753      Component Value Date/Time   CALCIUM 8.9 07/23/2017 0755   CALCIUM 8.8 01/21/2017 0753   ALKPHOS 101 07/23/2017 0755   ALKPHOS 130 01/21/2017 0753   AST 20 07/23/2017 0755   AST 19 01/21/2017 0753   ALT 13 07/23/2017 0755   ALT 11 01/21/2017 0753   BILITOT 0.6 07/23/2017 0755   BILITOT 0.24 01/21/2017 0753       RADIOGRAPHIC STUDIES: Ct Chest W Contrast  Result Date: 07/31/2017 CLINICAL DATA:  Left lung cancer, chemotherapy and radiation therapy complete. Chronic cough. EXAM: CT CHEST WITH CONTRAST TECHNIQUE: Multidetector CT imaging of the chest was performed during intravenous contrast administration. CONTRAST:  14mL ISOVUE-300 IOPAMIDOL (ISOVUE-300) INJECTION 61% COMPARISON:  01/23/2017. 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 Coronary artery calcification. Heart size normal. No pericardial effusion. Mediastinum/Nodes: No pathologically enlarged mediastinal, hilar or axillary lymph nodes. Upper esophagus is dilated and contains an air-fluid level with decompression at the level of the aberrant left subclavian artery. Lungs/Pleura: Right lung is clear. Post  treatment changes in the medial left hemithorax with narrowing of the left upper lobe bronchi. Trace associated loculated pleural fluid in the upper left hemithorax. Findings are unchanged from 01/23/2017. Airway is otherwise unremarkable. Upper Abdomen: Visualized portions of the liver, adrenal glands, kidneys, spleen, pancreas, stomach and bowel are grossly unremarkable. No upper abdominal adenopathy. Musculoskeletal: Mild degenerative changes in the spine. No worrisome lytic or sclerotic lesions. IMPRESSION: 1. Post treatment changes in the left hemithorax without evidence of recurrent or metastatic disease. 2. 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 Correlation for dysphagia is suggested. 3. Coronary artery calcification. Electronically Signed   By: Lorin Picket M.D.   On: 07/31/2017 11:32   ASSESSMENT AND PLAN:  This is a very pleasant 57 years old African-American male with a stage IIIa non-small cell lung cancer, squamous cell carcinoma presented with large obstructing left upper lobe lung mass in addition to mediastinal lymphadenopathy diagnosed in February 2017 status post concurrent chemoradiation followed by consolidation chemotherapy. The patient is current on observation since August 2017. He has been doing well with no specific complaints. The recent CT scan of the chest showed no concerning findings for disease recurrence  or metastasis.  I discussed the scan results with the patient and recommended for him to continue on observation with repeat CT scan of the chest in 6 months. The patient denied having any dysphagia or odynophagia to correspond with the abnormality seen in the upper esophagus. He was advised to call immediately if he has any concerning symptoms in the interval. The patient voices understanding of current disease status and treatment options and is in agreement with the current  care plan. All questions were answered. The patient knows to call the clinic with any problems, questions or concerns. We can certainly see the patient much sooner if necessary. I spent 10 minutes counseling the patient face to face. The total time spent in the appointment was 15 minutes.  Disclaimer: This note was dictated with voice recognition software. Similar sounding words can inadvertently be transcribed and may not be corrected upon review.

## 2017-09-04 ENCOUNTER — Telehealth: Payer: Self-pay | Admitting: Internal Medicine

## 2017-09-04 NOTE — Telephone Encounter (Signed)
Scheduled appt per 2/19 los - sent reminder letter in the mail - lab, scan and f/u in 6 months.

## 2018-02-26 ENCOUNTER — Telehealth: Payer: Self-pay | Admitting: Internal Medicine

## 2018-02-26 NOTE — Telephone Encounter (Signed)
MM PAL - moved 8/20 f/u to 9/9. Lab for 8/16 remains the same - ct pending and patient provided with information to f/u with central radiology. Left message for patient. Schedule mailed.

## 2018-02-28 ENCOUNTER — Encounter: Payer: Self-pay | Admitting: General Practice

## 2018-02-28 ENCOUNTER — Inpatient Hospital Stay: Payer: Medicare Other | Attending: Internal Medicine

## 2018-02-28 DIAGNOSIS — C3412 Malignant neoplasm of upper lobe, left bronchus or lung: Secondary | ICD-10-CM | POA: Diagnosis not present

## 2018-02-28 DIAGNOSIS — C3490 Malignant neoplasm of unspecified part of unspecified bronchus or lung: Secondary | ICD-10-CM

## 2018-02-28 LAB — CMP (CANCER CENTER ONLY)
ALT: 10 U/L (ref 0–44)
AST: 18 U/L (ref 15–41)
Albumin: 3.4 g/dL — ABNORMAL LOW (ref 3.5–5.0)
Alkaline Phosphatase: 83 U/L (ref 38–126)
Anion gap: 6 (ref 5–15)
BUN: 10 mg/dL (ref 6–20)
CHLORIDE: 107 mmol/L (ref 98–111)
CO2: 27 mmol/L (ref 22–32)
CREATININE: 0.89 mg/dL (ref 0.61–1.24)
Calcium: 8.1 mg/dL — ABNORMAL LOW (ref 8.9–10.3)
GFR, Est AFR Am: >60 mL/min (ref >60)
GFR, Estimated: >60 mL/min (ref >60)
Glucose, Bld: 104 mg/dL — ABNORMAL HIGH (ref 70–99)
Potassium: 3.8 mmol/L (ref 3.5–5.1)
SODIUM: 140 mmol/L (ref 135–145)
Total Bilirubin: 0.4 mg/dL (ref 0.3–1.2)
Total Protein: 6.8 g/dL (ref 6.5–8.1)

## 2018-02-28 LAB — CBC WITH DIFFERENTIAL (CANCER CENTER ONLY)
Basophils Absolute: 0 10*3/uL (ref 0.0–0.1)
Basophils Relative: 1 %
EOS ABS: 0.2 10*3/uL (ref 0.0–0.5)
Eosinophils Relative: 6 %
HEMATOCRIT: 41.4 % (ref 38.4–49.9)
HEMOGLOBIN: 13.6 g/dL (ref 13.0–17.1)
LYMPHS ABS: 1.2 10*3/uL (ref 0.9–3.3)
Lymphocytes Relative: 39 %
MCH: 31.5 pg (ref 27.2–33.4)
MCHC: 32.9 g/dL (ref 32.0–36.0)
MCV: 95.7 fL (ref 79.3–98.0)
MONOS PCT: 12 %
Monocytes Absolute: 0.4 10*3/uL (ref 0.1–0.9)
NEUTROS PCT: 42 %
Neutro Abs: 1.4 10*3/uL — ABNORMAL LOW (ref 1.5–6.5)
Platelet Count: 186 10*3/uL (ref 140–400)
RBC: 4.33 MIL/uL (ref 4.20–5.82)
RDW: 13.3 % (ref 11.0–14.6)
WBC Count: 3.2 10*3/uL — ABNORMAL LOW (ref 4.0–10.3)

## 2018-02-28 NOTE — Progress Notes (Signed)
[facility] CSW Progress Note  Patient came to [employer] requesting bus pass; one ride pass provided.  Edwyna Shell, LCSW Clinical Social Worker Phone:  [phone]

## 2018-03-04 ENCOUNTER — Ambulatory Visit: Payer: Self-pay | Admitting: Internal Medicine

## 2018-03-12 ENCOUNTER — Ambulatory Visit (HOSPITAL_COMMUNITY): Payer: Medicare Other

## 2018-03-19 ENCOUNTER — Ambulatory Visit (HOSPITAL_COMMUNITY)
Admission: RE | Admit: 2018-03-19 | Discharge: 2018-03-19 | Disposition: A | Discharge status: Home / Self Care | Payer: Medicare Other | Source: Ambulatory Visit | Attending: Internal Medicine | Admitting: Internal Medicine

## 2018-03-19 DIAGNOSIS — C3490 Malignant neoplasm of unspecified part of unspecified bronchus or lung: Secondary | ICD-10-CM | POA: Diagnosis not present

## 2018-03-19 DIAGNOSIS — R918 Other nonspecific abnormal finding of lung field: Secondary | ICD-10-CM | POA: Diagnosis not present

## 2018-03-19 DIAGNOSIS — Y842 Radiological procedure and radiotherapy as the cause of abnormal reaction of the patient, or of later complication, without mention of misadventure at the time of the procedure: Secondary | ICD-10-CM | POA: Diagnosis not present

## 2018-03-19 MED ORDER — IOHEXOL 300 MG/ML  SOLN
75.0000 mL | Freq: Once | INTRAMUSCULAR | Status: AC | PRN
  Administered 2018-03-19: 75 mL via INTRAVENOUS

## 2018-03-24 ENCOUNTER — Inpatient Hospital Stay: Payer: Medicare Other | Attending: Internal Medicine | Admitting: Internal Medicine

## 2018-03-24 ENCOUNTER — Telehealth: Payer: Self-pay | Admitting: Internal Medicine

## 2018-03-24 ENCOUNTER — Encounter: Payer: Self-pay | Admitting: *Deleted

## 2018-03-24 ENCOUNTER — Encounter: Payer: Self-pay | Admitting: Internal Medicine

## 2018-03-24 VITALS — BP 111/93 | HR 87 | Temp 98.5°F | Resp 18 | Ht 73.0 in | Wt 139.8 lb

## 2018-03-24 DIAGNOSIS — Z79899 Other long term (current) drug therapy: Secondary | ICD-10-CM | POA: Diagnosis not present

## 2018-03-24 DIAGNOSIS — R5383 Other fatigue: Secondary | ICD-10-CM | POA: Diagnosis not present

## 2018-03-24 DIAGNOSIS — Z9221 Personal history of antineoplastic chemotherapy: Secondary | ICD-10-CM | POA: Diagnosis not present

## 2018-03-24 DIAGNOSIS — C3412 Malignant neoplasm of upper lobe, left bronchus or lung: Secondary | ICD-10-CM | POA: Diagnosis present

## 2018-03-24 DIAGNOSIS — F17210 Nicotine dependence, cigarettes, uncomplicated: Secondary | ICD-10-CM | POA: Diagnosis not present

## 2018-03-24 DIAGNOSIS — F1910 Other psychoactive substance abuse, uncomplicated: Secondary | ICD-10-CM | POA: Diagnosis not present

## 2018-03-24 DIAGNOSIS — Z923 Personal history of irradiation: Secondary | ICD-10-CM | POA: Insufficient documentation

## 2018-03-24 DIAGNOSIS — C3490 Malignant neoplasm of unspecified part of unspecified bronchus or lung: Secondary | ICD-10-CM

## 2018-03-24 DIAGNOSIS — R42 Dizziness and giddiness: Secondary | ICD-10-CM | POA: Diagnosis not present

## 2018-03-24 NOTE — Progress Notes (Signed)
[place] Telephone:([phone]) [phone]   [phone]  OFFICE PROGRESS NOTE  Patient, No Pcp Per No address on file  DIAGNOSIS: Stage IIIA (T2a, N2, M0) non-small cell lung cancer, squamous cell carcinoma diagnosed in February 2017 and presented with left upper lobe obstructing mass as well as questionable periaortic lymphadenopathy.  PRIOR THERAPY:  1) Concurrent chemoradiation with weekly carboplatin for AUC of 2 and paclitaxel 45 MG/M2. First treatment was given on 10/10/2015. Status post 5 weeks of treatment. Last dose was given 11/14/2015 with partial response. 2) Consolidation systemic chemotherapy with carboplatin for AUC of 5 and paclitaxel 175 MG/M2 every 3 weeks with Neulasta support. First dose 01/25/2016. Status post 3 cycles.  CURRENT THERAPY: Observation.  INTERVAL HISTORY: Maurice Mcknight 57 y.o. male returns to the clinic today for six-month follow-up visit.  The patient is feeling fine today with no specific complaints except for mild fatigue and occasional dizzy spells.  Unfortunately he continues to smoke at regular basis and he also has persistent drug abuse issues.  He mentioned that he is associated with the wrong guys.  He denied having any current chest pain, shortness breath, cough or hemoptysis.  He denied having any fever or chills.  He has no nausea, vomiting, diarrhea or constipation.  He has no recent weight loss or night sweats.  He had a repeat CT scan of the chest performed recently and is here for evaluation and discussion of his discuss results.   MEDICAL HISTORY: Past Medical History:  Diagnosis Date  . Encounter for antineoplastic chemotherapy 10/17/2015  . Fever and chills 04/11/2016  . lung ca dx'd 09/2015  . Malnutrition ([place]) 09/28/2015  . Needs smoking cessation education 09/28/2015  . Radiation 10/17/15-11/25/15   left chest 60 Gy    ALLERGIES:  has No Known Allergies.  MEDICATIONS:  Current Outpatient Medications    Medication Sig Dispense Refill  . ibuprofen (ADVIL,MOTRIN) 200 MG tablet Take 800 mg by mouth every 6 (six) hours as needed for moderate pain. Reported on 01/05/2016    . naproxen sodium (ANAPROX) 220 MG tablet Take 220-440 mg by mouth every 12 (twelve) hours as needed (for pain).    . prochlorperazine (COMPAZINE) 10 MG tablet Take 1 tablet (10 mg total) by mouth every 6 (six) hours as needed for nausea or vomiting. 30 tablet 0  . ranitidine (ZANTAC) 150 MG capsule Take 1 capsule (150 mg total) by mouth daily. 30 capsule 0  . sucralfate (CARAFATE) 1 GM/10ML suspension Take 10 mLs (1 g total) by mouth 4 (four) times daily -  with meals and at bedtime. 420 mL 0   No current facility-administered medications for this visit.    Facility-Administered Medications Ordered in Other Visits  Medication Dose Route Frequency Provider Last Rate Last Dose  . diphenhydrAMINE (BENADRYL) injection 50 mg  50 mg Intravenous Once Curt Bears, MD        SURGICAL HISTORY:  Past Surgical History:  Procedure Laterality Date  . VIDEO BRONCHOSCOPY Bilateral 09/09/2015   Diagnosis    REVIEW OF SYSTEMS:  A comprehensive review of systems was negative except for: Neurological: positive for dizziness   PHYSICAL EXAMINATION: General appearance: alert, cooperative and no distress Head: Normocephalic, without obvious abnormality, atraumatic Neck: no adenopathy, no JVD, supple, symmetrical, trachea midline and thyroid not enlarged, symmetric, no tenderness/mass/nodules Lymph nodes: Cervical, supraclavicular, and axillary nodes normal. Resp: clear to auscultation bilaterally Back: symmetric, no curvature. ROM normal. No CVA tenderness. Cardio: regular  rate and rhythm, S1, S2 normal, no murmur, click, rub or gallop GI: soft, non-tender; bowel sounds normal; no masses,  no organomegaly Extremities: extremities normal, atraumatic, no cyanosis or edema  ECOG PERFORMANCE STATUS: 0 - Asymptomatic  Blood pressure (!)  111/93, pulse 87, temperature 98.5 F (36.9 C), temperature source Oral, resp. rate 18, height 6\' 1"  (1.854 m), weight 139 lb 12.8 oz (63.4 kg), SpO2 100 %.  LABORATORY DATA: Lab Results  Component Value Date   WBC 3.2 (L) 02/28/2018   HGB 13.6 02/28/2018   HCT 41.4 02/28/2018   MCV 95.7 02/28/2018   PLT 186 02/28/2018      Chemistry      Component Value Date/Time   NA 140 02/28/2018 1219   NA 141 01/21/2017 0753   K 3.8 02/28/2018 1219   K 4.2 01/21/2017 0753   CL 107 02/28/2018 1219   CO2 27 02/28/2018 1219   CO2 26 01/21/2017 0753   BUN 10 02/28/2018 1219   BUN 10.1 01/21/2017 0753   CREATININE 0.89 02/28/2018 1219   CREATININE 1.0 01/21/2017 0753      Component Value Date/Time   CALCIUM 8.1 (L) 02/28/2018 1219   CALCIUM 8.8 01/21/2017 0753   ALKPHOS 83 02/28/2018 1219   ALKPHOS 130 01/21/2017 0753   AST 18 02/28/2018 1219   AST 19 01/21/2017 0753   ALT 10 02/28/2018 1219   ALT 11 01/21/2017 0753   BILITOT 0.4 02/28/2018 1219   BILITOT 0.24 01/21/2017 0753       RADIOGRAPHIC STUDIES: Ct Chest W Contrast  Result Date: 03/19/2018 CLINICAL DATA:  Follow-up lung cancer EXAM: CT CHEST WITH CONTRAST TECHNIQUE: Multidetector CT imaging of the chest was performed during intravenous contrast administration. CONTRAST:  41mL OMNIPAQUE IOHEXOL 300 MG/ML  SOLN COMPARISON:  07/31/2017 FINDINGS: Cardiovascular: Heart is normal in size.  No pericardial effusion. No evidence of thoracic aortic aneurysm. 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 Mild coronary atherosclerosis the LAD. Mediastinum/Nodes: No suspicious mediastinal lymphadenopathy. Visualized thyroid is unremarkable. Lungs/Pleura: Radiation changes in the medial left upper lobe/paramediastinal region. Associated volume loss. 1.6 x 2.1 cm nodular opacity in the posterior left hemithorax, previously 1.0 x 1.5 cm, worrisome for recurrence. Right lung is clear. No pleural effusion or pneumothorax. Upper Abdomen:  Mild nodular thickening of the left adrenal gland (series 2/image 146), without discrete mass. Musculoskeletal: Visualized osseous structures are within normal limits. IMPRESSION: Radiation changes in the left hemithorax. 1.6 x 2.1 cm nodular opacity in the posterior left hemithorax, progressive, worrisome for recurrence. Consider short-term follow-up CT chest or PET-CT. Electronically Signed   By: Julian Hy M.D.   On: 03/19/2018 13:25    ASSESSMENT AND PLAN:  This is a very pleasant 57 years old African-American male with a stage IIIa non-small cell lung cancer, squamous cell carcinoma presented with large obstructing left upper lobe lung mass in addition to mediastinal lymphadenopathy diagnosed in February 2017 status post concurrent chemoradiation followed by consolidation chemotherapy. The patient is current on observation since August 2017. Has no complaints today except for intermittent dizzy spells but he also continues to smoke and have issues with drug abuse that could be contributing factors. Repeat CT scan of the chest was performed recently.  I personally and independently reviewed the scan images and discussed the results with the patient.  He has concerning findings of enlarging left lung nodule suspicious for disease recurrence or progression. I discussed with the patient his treatment options including proceeding with a PET scan  versus continuous observation and short-term follow-up with CT scan of the chest.  The patient would like to continue on observation for now with repeat CT scan of the chest. I will see him back for follow-up visit in 2 months for evaluation with CT scan of the chest for restaging of his disease.  If this lesion continues to increase in size, we will consider The patient for a PET scan and repeat biopsy. For smoking cessation and drug abuse, I strongly encouraged the patient to quit smoking and drug abuse. He was advised to call immediately if he has any  concerning symptoms in the interval. The patient voices understanding of current disease status and treatment options and is in agreement with the current care plan. All questions were answered. The patient knows to call the clinic with any problems, questions or concerns. We can certainly see the patient much sooner if necessary. I spent 10 minutes counseling the patient face to face. The total time spent in the appointment was 15 minutes.  Disclaimer: This note was dictated with voice recognition software. Similar sounding words can inadvertently be transcribed and may not be corrected upon review.

## 2018-03-24 NOTE — Progress Notes (Signed)
Oncology Nurse Navigator Documentation  Oncology Nurse Navigator Flowsheets 03/24/2018  Navigator Location [facility]  Navigator Encounter Type Clinic/MDC/I spoke with patient today at clinic.  He is currently on observation.  His plan of care is to have a scan in 2 months then follow up with [name].  I educated on plan.  He verbalized understanding of plan.    Patient Visit Type MedOnc  Treatment Phase Follow-up  Barriers/Navigation Needs Education  Education Other  Interventions Education  Education Method Verbal  Acuity Level 1  Time Spent with Patient 30

## 2018-03-24 NOTE — Telephone Encounter (Signed)
Gave pt avs and calendar  °

## 2018-05-21 ENCOUNTER — Encounter (HOSPITAL_COMMUNITY): Payer: Self-pay

## 2018-05-21 ENCOUNTER — Ambulatory Visit (HOSPITAL_COMMUNITY)
Admission: RE | Admit: 2018-05-21 | Discharge: 2018-05-21 | Disposition: A | Discharge status: Home / Self Care | Payer: Medicare Other | Source: Ambulatory Visit | Attending: Internal Medicine | Admitting: Internal Medicine

## 2018-05-21 ENCOUNTER — Inpatient Hospital Stay: Payer: Medicare Other | Attending: Internal Medicine

## 2018-05-21 DIAGNOSIS — C3490 Malignant neoplasm of unspecified part of unspecified bronchus or lung: Secondary | ICD-10-CM

## 2018-05-21 DIAGNOSIS — Z9221 Personal history of antineoplastic chemotherapy: Secondary | ICD-10-CM | POA: Insufficient documentation

## 2018-05-21 DIAGNOSIS — Z79899 Other long term (current) drug therapy: Secondary | ICD-10-CM | POA: Diagnosis not present

## 2018-05-21 DIAGNOSIS — E46 Unspecified protein-calorie malnutrition: Secondary | ICD-10-CM | POA: Insufficient documentation

## 2018-05-21 DIAGNOSIS — Z923 Personal history of irradiation: Secondary | ICD-10-CM | POA: Diagnosis not present

## 2018-05-21 DIAGNOSIS — C3412 Malignant neoplasm of upper lobe, left bronchus or lung: Secondary | ICD-10-CM | POA: Diagnosis not present

## 2018-05-21 DIAGNOSIS — R590 Localized enlarged lymph nodes: Secondary | ICD-10-CM | POA: Diagnosis not present

## 2018-05-21 DIAGNOSIS — R911 Solitary pulmonary nodule: Secondary | ICD-10-CM | POA: Insufficient documentation

## 2018-05-21 DIAGNOSIS — R05 Cough: Secondary | ICD-10-CM | POA: Insufficient documentation

## 2018-05-21 DIAGNOSIS — F17210 Nicotine dependence, cigarettes, uncomplicated: Secondary | ICD-10-CM | POA: Insufficient documentation

## 2018-05-21 LAB — CBC WITH DIFFERENTIAL (CANCER CENTER ONLY)
Abs Immature Granulocytes: 0 10*3/uL (ref 0.00–0.07)
BASOS ABS: 0 10*3/uL (ref 0.0–0.1)
BASOS PCT: 1 %
EOS ABS: 0.2 10*3/uL (ref 0.0–0.5)
Eosinophils Relative: 5 %
HCT: 47 % (ref 39.0–52.0)
Hemoglobin: 15.2 g/dL (ref 13.0–17.0)
IMMATURE GRANULOCYTES: 0 %
Lymphocytes Relative: 39 %
Lymphs Abs: 1.3 10*3/uL (ref 0.7–4.0)
MCH: 31.2 pg (ref 26.0–34.0)
MCHC: 32.3 g/dL (ref 30.0–36.0)
MCV: 96.5 fL (ref 80.0–100.0)
Monocytes Absolute: 0.5 10*3/uL (ref 0.1–1.0)
Monocytes Relative: 16 %
NEUTROS PCT: 39 %
NRBC: 0 % (ref 0.0–0.2)
Neutro Abs: 1.3 10*3/uL — ABNORMAL LOW (ref 1.7–7.7)
PLATELETS: 208 10*3/uL (ref 150–400)
RBC: 4.87 MIL/uL (ref 4.22–5.81)
RDW: 12.4 % (ref 11.5–15.5)
WBC: 3.2 10*3/uL — AB (ref 4.0–10.5)

## 2018-05-21 LAB — CMP (CANCER CENTER ONLY)
ALK PHOS: 83 U/L (ref 38–126)
ALT: 10 U/L (ref 0–44)
AST: 20 U/L (ref 15–41)
Albumin: 3.7 g/dL (ref 3.5–5.0)
Anion gap: 6 (ref 5–15)
BILIRUBIN TOTAL: 0.6 mg/dL (ref 0.3–1.2)
BUN: 10 mg/dL (ref 6–20)
CALCIUM: 9.3 mg/dL (ref 8.9–10.3)
CO2: 29 mmol/L (ref 22–32)
Chloride: 103 mmol/L (ref 98–111)
Creatinine: 1.06 mg/dL (ref 0.61–1.24)
Glucose, Bld: 61 mg/dL — ABNORMAL LOW (ref 70–99)
Potassium: 4 mmol/L (ref 3.5–5.1)
Sodium: 138 mmol/L (ref 135–145)
TOTAL PROTEIN: 7.9 g/dL (ref 6.5–8.1)

## 2018-05-21 MED ORDER — SODIUM CHLORIDE (PF) 0.9 % IJ SOLN
INTRAMUSCULAR | Status: AC
  Filled 2018-05-21: qty 50

## 2018-05-21 MED ORDER — IOHEXOL 300 MG/ML  SOLN
75.0000 mL | Freq: Once | INTRAMUSCULAR | Status: AC | PRN
  Administered 2018-05-21: 75 mL via INTRAVENOUS

## 2018-05-27 ENCOUNTER — Encounter: Payer: Self-pay | Admitting: Internal Medicine

## 2018-05-27 ENCOUNTER — Inpatient Hospital Stay (HOSPITAL_BASED_OUTPATIENT_CLINIC_OR_DEPARTMENT_OTHER): Payer: Medicare Other | Admitting: Internal Medicine

## 2018-05-27 ENCOUNTER — Telehealth: Payer: Self-pay | Admitting: Internal Medicine

## 2018-05-27 VITALS — BP 109/88 | HR 85 | Temp 98.2°F | Resp 17 | Ht 73.0 in | Wt 145.2 lb

## 2018-05-27 DIAGNOSIS — Z9221 Personal history of antineoplastic chemotherapy: Secondary | ICD-10-CM | POA: Diagnosis not present

## 2018-05-27 DIAGNOSIS — F17210 Nicotine dependence, cigarettes, uncomplicated: Secondary | ICD-10-CM | POA: Diagnosis not present

## 2018-05-27 DIAGNOSIS — C3490 Malignant neoplasm of unspecified part of unspecified bronchus or lung: Secondary | ICD-10-CM

## 2018-05-27 DIAGNOSIS — R05 Cough: Secondary | ICD-10-CM | POA: Diagnosis not present

## 2018-05-27 DIAGNOSIS — Z923 Personal history of irradiation: Secondary | ICD-10-CM | POA: Diagnosis not present

## 2018-05-27 DIAGNOSIS — C3412 Malignant neoplasm of upper lobe, left bronchus or lung: Secondary | ICD-10-CM

## 2018-05-27 DIAGNOSIS — R590 Localized enlarged lymph nodes: Secondary | ICD-10-CM | POA: Diagnosis not present

## 2018-05-27 DIAGNOSIS — Z79899 Other long term (current) drug therapy: Secondary | ICD-10-CM | POA: Diagnosis not present

## 2018-05-27 DIAGNOSIS — E46 Unspecified protein-calorie malnutrition: Secondary | ICD-10-CM | POA: Diagnosis not present

## 2018-05-27 DIAGNOSIS — F17200 Nicotine dependence, unspecified, uncomplicated: Secondary | ICD-10-CM

## 2018-05-27 NOTE — Telephone Encounter (Signed)
Scheduled appt per 11/12 los- gave patient AVS and calender per los.   

## 2018-05-27 NOTE — Addendum Note (Signed)
Addended by: Ardeen Garland on: 05/27/2018 09:49 AM   Modules accepted: Orders

## 2018-05-27 NOTE — Progress Notes (Signed)
[place] [phone]   Fax:([phone]  OFFICE PROGRESS NOTE  Patient, No Pcp Per No address on file  DIAGNOSIS: Stage IIIA (T2a, N2, M0) non-small cell lung cancer, squamous cell carcinoma diagnosed in February 2017 and presented with left upper lobe obstructing mass as well as questionable periaortic lymphadenopathy.  PRIOR THERAPY:  1) Concurrent chemoradiation with weekly carboplatin for AUC of 2 and paclitaxel 45 MG/M2. First treatment was given on 10/10/2015. Status post 5 weeks of treatment. Last dose was given 11/14/2015 with partial response. 2) Consolidation systemic chemotherapy with carboplatin for AUC of 5 and paclitaxel 175 MG/M2 every 3 weeks with Neulasta support. First dose 01/25/2016. Status post 3 cycles.  CURRENT THERAPY: Observation.  INTERVAL HISTORY: Maurice Mcknight 57 y.o. male returns to the clinic today for follow-up visit.  The patient is feeling fine today with no specific complaints except for cough.  He denied having any chest pain, shortness breath or hemoptysis.  He denied having any recent weight loss or night sweats.  He has no nausea, vomiting, diarrhea or constipation.  He has no headache or visual changes.  Unfortunately he continues to smoke and abuse drugs.  He had repeat CT scan of the chest performed recently and he is here for evaluation and discussion of his discuss results.   MEDICAL HISTORY: Past Medical History:  Diagnosis Date  . Encounter for antineoplastic chemotherapy 10/17/2015  . Fever and chills 04/11/2016  . lung ca dx'd 09/2015  . Malnutrition ([place]) 09/28/2015  . Needs smoking cessation education 09/28/2015  . Radiation 10/17/15-11/25/15   left chest 60 Gy    ALLERGIES:  has No Known Allergies.  MEDICATIONS:  Current Outpatient Medications  Medication Sig Dispense Refill  . ibuprofen (ADVIL,MOTRIN) 200 MG tablet Take 800 mg by mouth every 6 (six) hours as needed for moderate pain. Reported on  01/05/2016    . naproxen sodium (ANAPROX) 220 MG tablet Take 220-440 mg by mouth every 12 (twelve) hours as needed (for pain).    . prochlorperazine (COMPAZINE) 10 MG tablet Take 1 tablet (10 mg total) by mouth every 6 (six) hours as needed for nausea or vomiting. 30 tablet 0  . ranitidine (ZANTAC) 150 MG capsule Take 1 capsule (150 mg total) by mouth daily. 30 capsule 0  . sucralfate (CARAFATE) 1 GM/10ML suspension Take 10 mLs (1 g total) by mouth 4 (four) times daily -  with meals and at bedtime. 420 mL 0   No current facility-administered medications for this visit.    Facility-Administered Medications Ordered in Other Visits  Medication Dose Route Frequency Provider Last Rate Last Dose  . diphenhydrAMINE (BENADRYL) injection 50 mg  50 mg Intravenous Once Curt Bears, MD        SURGICAL HISTORY:  Past Surgical History:  Procedure Laterality Date  . VIDEO BRONCHOSCOPY Bilateral 09/09/2015   Diagnosis    REVIEW OF SYSTEMS:  A comprehensive review of systems was negative except for: Respiratory: positive for cough   PHYSICAL EXAMINATION: General appearance: alert, cooperative and no distress Head: Normocephalic, without obvious abnormality, atraumatic Neck: no adenopathy, no JVD, supple, symmetrical, trachea midline and thyroid not enlarged, symmetric, no tenderness/mass/nodules Lymph nodes: Cervical, supraclavicular, and axillary nodes normal. Resp: clear to auscultation bilaterally Back: symmetric, no curvature. ROM normal. No CVA tenderness. Cardio: regular rate and rhythm, S1, S2 normal, no murmur, click, rub or gallop GI: soft, non-tender; bowel sounds normal; no masses,  no organomegaly Extremities: extremities normal, atraumatic,  no cyanosis or edema  ECOG PERFORMANCE STATUS: 1 - Symptomatic but completely ambulatory  Blood pressure 109/88, pulse 85, temperature 98.2 F (36.8 C), temperature source Oral, resp. rate 17, height 6\' 1"  (1.854 m), weight 145 lb 3.2 oz (65.9  kg), SpO2 100 %.  LABORATORY DATA: Lab Results  Component Value Date   WBC 3.2 (L) 05/21/2018   HGB 15.2 05/21/2018   HCT 47.0 05/21/2018   MCV 96.5 05/21/2018   PLT 208 05/21/2018      Chemistry      Component Value Date/Time   NA 138 05/21/2018 0843   NA 141 01/21/2017 0753   K 4.0 05/21/2018 0843   K 4.2 01/21/2017 0753   CL 103 05/21/2018 0843   CO2 29 05/21/2018 0843   CO2 26 01/21/2017 0753   BUN 10 05/21/2018 0843   BUN 10.1 01/21/2017 0753   CREATININE 1.06 05/21/2018 0843   CREATININE 1.0 01/21/2017 0753      Component Value Date/Time   CALCIUM 9.3 05/21/2018 0843   CALCIUM 8.8 01/21/2017 0753   ALKPHOS 83 05/21/2018 0843   ALKPHOS 130 01/21/2017 0753   AST 20 05/21/2018 0843   AST 19 01/21/2017 0753   ALT 10 05/21/2018 0843   ALT 11 01/21/2017 0753   BILITOT 0.6 05/21/2018 0843   BILITOT 0.24 01/21/2017 0753       RADIOGRAPHIC STUDIES: Ct Chest W Contrast  Result Date: 05/21/2018 CLINICAL DATA:  Lung cancer diagnosed March 2017. Radiation therapy complete 2018 EXAM: CT CHEST WITH CONTRAST TECHNIQUE: Multidetector CT imaging of the chest was performed during intravenous contrast administration. CONTRAST:  19mL OMNIPAQUE IOHEXOL 300 MG/ML  SOLN COMPARISON:  None. FINDINGS: Cardiovascular: RIGHT-sided aortic arch. No significant vascular findings. Normal heart size. No pericardial effusion. Mediastinum/Nodes: No axillary supraclavicular adenopathy. No mediastinal hilar adenopathy. Small fluid esophagus. Lungs/Pleura: Again demonstrated rounded lesion along the pleural surface in theLEFT lower lobe measures 2.6 x 1.8 cm (image 74/2). This is enlarged from 1.7 x 1.4 cm on comparison CT 03/19/2018 This rounded nodule is just inferior to the LEFT perihilar linear radiation change and bronchiectasis. Volume loss in LEFT hemithorax consistent with LEFT upper lobectomy. RIGHT lung is hyperexpanded.  No suspicious pulmonary nodularity. Upper Abdomen: Limited view of the  liver, kidneys, pancreas are unremarkable. Normal adrenal glands. Musculoskeletal: No aggressive osseous lesion. IMPRESSION: 1. Persistent enlargement of rounded nodule along the pleural surface of the LEFT lower lobe adjacent to radiation treatment site. Differential includes rounded atelectasis versus lung carcinoma recurrence. Due to increase in size, recommend FDG PET scan versus tissue sampling. 2. Stable post radiation change about the LEFT hilum. 3. No mediastinal adenopathy Electronically Signed   By: Suzy Bouchard M.D.   On: 05/21/2018 14:15    ASSESSMENT AND PLAN:  This is a very pleasant 57 years old African-American male with a stage IIIA non-small cell lung cancer, squamous cell carcinoma presented with large obstructing left upper lobe lung mass in addition to mediastinal lymphadenopathy diagnosed in February 2017 status post concurrent chemoradiation followed by consolidation chemotherapy. The patient has been in observation since 2017.  He is feeling fine with no concerning complaints.  Repeat CT scan of the chest performed recently showed enlargement of rounded nodule in the left lower lobe.  This is suspicious for disease recurrence.  I personally and independently reviewed the scan images and discussed the results with the patient today. I recommended for the patient to have a PET scan for further evaluation of this lesion.  I will see him back for follow-up visit in around 3 weeks for discussion of the PET scan results and recommendation regarding his condition. I strongly encouraged the patient to quit smoking and avoid drug abuse. He was also advised to call immediately if he has any concerning symptoms in the interval. The patient voices understanding of current disease status and treatment options and is in agreement with the current care plan. All questions were answered. The patient knows to call the clinic with any problems, questions or concerns. We can certainly see the  patient much sooner if necessary. I spent 10 minutes counseling the patient face to face. The total time spent in the appointment was 15 minutes.  Disclaimer: This note was dictated with voice recognition software. Similar sounding words can inadvertently be transcribed and may not be corrected upon review.

## 2018-06-11 ENCOUNTER — Ambulatory Visit (HOSPITAL_COMMUNITY): Payer: Medicare Other

## 2018-06-23 ENCOUNTER — Ambulatory Visit (HOSPITAL_COMMUNITY)
Admission: RE | Admit: 2018-06-23 | Discharge: 2018-06-23 | Disposition: A | Discharge status: Home / Self Care | Payer: Medicare Other | Source: Ambulatory Visit | Attending: Internal Medicine | Admitting: Internal Medicine

## 2018-06-23 DIAGNOSIS — C3490 Malignant neoplasm of unspecified part of unspecified bronchus or lung: Secondary | ICD-10-CM

## 2018-06-23 LAB — GLUCOSE, CAPILLARY: GLUCOSE-CAPILLARY: 98 mg/dL (ref 70–99)

## 2018-06-23 MED ORDER — FLUDEOXYGLUCOSE F - 18 (FDG) INJECTION
7.2000 | Freq: Once | INTRAVENOUS | Status: AC | PRN
  Administered 2018-06-23: 7.2 via INTRAVENOUS

## 2018-06-25 ENCOUNTER — Telehealth: Payer: Self-pay | Admitting: Internal Medicine

## 2018-06-25 ENCOUNTER — Inpatient Hospital Stay: Payer: Medicare Other | Attending: Internal Medicine | Admitting: Internal Medicine

## 2018-06-25 ENCOUNTER — Encounter: Payer: Self-pay | Admitting: Internal Medicine

## 2018-06-25 VITALS — BP 116/85 | HR 63 | Temp 96.4°F | Resp 18 | Ht 73.0 in | Wt 143.9 lb

## 2018-06-25 DIAGNOSIS — F17210 Nicotine dependence, cigarettes, uncomplicated: Secondary | ICD-10-CM

## 2018-06-25 DIAGNOSIS — Z923 Personal history of irradiation: Secondary | ICD-10-CM | POA: Diagnosis not present

## 2018-06-25 DIAGNOSIS — Z716 Tobacco abuse counseling: Secondary | ICD-10-CM

## 2018-06-25 DIAGNOSIS — Z9221 Personal history of antineoplastic chemotherapy: Secondary | ICD-10-CM | POA: Diagnosis not present

## 2018-06-25 DIAGNOSIS — C3412 Malignant neoplasm of upper lobe, left bronchus or lung: Secondary | ICD-10-CM | POA: Insufficient documentation

## 2018-06-25 DIAGNOSIS — F1911 Other psychoactive substance abuse, in remission: Secondary | ICD-10-CM

## 2018-06-25 DIAGNOSIS — C3490 Malignant neoplasm of unspecified part of unspecified bronchus or lung: Secondary | ICD-10-CM

## 2018-06-25 NOTE — Telephone Encounter (Signed)
Printed calendar and avs. °

## 2018-06-25 NOTE — Progress Notes (Signed)
[place] [phone]   [phone]  OFFICE PROGRESS NOTE  Patient, No Pcp Per No address on file  DIAGNOSIS: Stage IIIA (T2a, N2, M0) non-small cell lung cancer, squamous cell carcinoma diagnosed in February 2017 and presented with left upper lobe obstructing mass as well as questionable periaortic lymphadenopathy.  PRIOR THERAPY:  1) Concurrent chemoradiation with weekly carboplatin for AUC of 2 and paclitaxel 45 MG/M2. First treatment was given on 10/10/2015. Status post 5 weeks of treatment. Last dose was given 11/14/2015 with partial response. 2) Consolidation systemic chemotherapy with carboplatin for AUC of 5 and paclitaxel 175 MG/M2 every 3 weeks with Neulasta support. First dose 01/25/2016. Status post 3 cycles.  CURRENT THERAPY: Observation.  INTERVAL HISTORY: Maurice Mcknight 57 y.o. male returns to the clinic today for follow-up visit.  The patient is feeling fine today with no specific complaints except for mild fatigue and shortness of breath with exertion.  He denied having any chest pain but has mild cough with no hemoptysis.  He has no nausea, vomiting, diarrhea or constipation.  He denied having any headache or visual changes.  The patient has no significant weight loss or night sweats.  Unfortunately he continues to smoke at regular basis.  He also has a history of drug abuse.  He had repeat PET scan performed recently for evaluation of disease recurrence in the left lung and he is here for discussion of the PET scan results and recommendation regarding his condition.   MEDICAL HISTORY: Past Medical History:  Diagnosis Date  . Encounter for antineoplastic chemotherapy 10/17/2015  . Fever and chills 04/11/2016  . lung ca dx'd 09/2015  . Malnutrition ([place]) 09/28/2015  . Needs smoking cessation education 09/28/2015  . Radiation 10/17/15-11/25/15   left chest 60 Gy    ALLERGIES:  has No Known Allergies.  MEDICATIONS:  Current  Outpatient Medications  Medication Sig Dispense Refill  . ranitidine (ZANTAC) 150 MG capsule Take 1 capsule (150 mg total) by mouth daily. 30 capsule 0  . ibuprofen (ADVIL,MOTRIN) 200 MG tablet Take 800 mg by mouth every 6 (six) hours as needed for moderate pain. Reported on 01/05/2016    . naproxen sodium (ANAPROX) 220 MG tablet Take 220-440 mg by mouth every 12 (twelve) hours as needed (for pain).     No current facility-administered medications for this visit.    Facility-Administered Medications Ordered in Other Visits  Medication Dose Route Frequency Provider Last Rate Last Dose  . diphenhydrAMINE (BENADRYL) injection 50 mg  50 mg Intravenous Once Curt Bears, MD        SURGICAL HISTORY:  Past Surgical History:  Procedure Laterality Date  . VIDEO BRONCHOSCOPY Bilateral 09/09/2015   Diagnosis    REVIEW OF SYSTEMS:  Constitutional: positive for fatigue Eyes: negative Ears, nose, mouth, throat, and face: negative Respiratory: positive for cough and dyspnea on exertion Cardiovascular: negative Gastrointestinal: negative Genitourinary:negative Integument/breast: negative Hematologic/lymphatic: negative Musculoskeletal:negative Neurological: negative Behavioral/Psych: negative Endocrine: negative Allergic/Immunologic: negative   PHYSICAL EXAMINATION: General appearance: alert, cooperative, fatigued and no distress Head: Normocephalic, without obvious abnormality, atraumatic Neck: no adenopathy, no JVD, supple, symmetrical, trachea midline and thyroid not enlarged, symmetric, no tenderness/mass/nodules Lymph nodes: Cervical, supraclavicular, and axillary nodes normal. Resp: clear to auscultation bilaterally Back: symmetric, no curvature. ROM normal. No CVA tenderness. Cardio: regular rate and rhythm, S1, S2 normal, no murmur, click, rub or gallop GI: soft, non-tender; bowel sounds normal; no masses,  no organomegaly Extremities: extremities normal, atraumatic,  no cyanosis  or edema Neurologic: Alert and oriented X 3, normal strength and tone. Normal symmetric reflexes. Normal coordination and gait  ECOG PERFORMANCE STATUS: 1 - Symptomatic but completely ambulatory  Blood pressure 116/85, pulse 63, temperature (!) 96.4 F (35.8 C), temperature source Oral, resp. rate 18, height 6\' 1"  (1.854 m), weight 143 lb 14.4 oz (65.3 kg), SpO2 100 %.  LABORATORY DATA: Lab Results  Component Value Date   WBC 3.2 (L) 05/21/2018   HGB 15.2 05/21/2018   HCT 47.0 05/21/2018   MCV 96.5 05/21/2018   PLT 208 05/21/2018      Chemistry      Component Value Date/Time   NA 138 05/21/2018 0843   NA 141 01/21/2017 0753   K 4.0 05/21/2018 0843   K 4.2 01/21/2017 0753   CL 103 05/21/2018 0843   CO2 29 05/21/2018 0843   CO2 26 01/21/2017 0753   BUN 10 05/21/2018 0843   BUN 10.1 01/21/2017 0753   CREATININE 1.06 05/21/2018 0843   CREATININE 1.0 01/21/2017 0753      Component Value Date/Time   CALCIUM 9.3 05/21/2018 0843   CALCIUM 8.8 01/21/2017 0753   ALKPHOS 83 05/21/2018 0843   ALKPHOS 130 01/21/2017 0753   AST 20 05/21/2018 0843   AST 19 01/21/2017 0753   ALT 10 05/21/2018 0843   ALT 11 01/21/2017 0753   BILITOT 0.6 05/21/2018 0843   BILITOT 0.24 01/21/2017 0753       RADIOGRAPHIC STUDIES: Nm Pet Image Restag (ps) Skull Base To Thigh  Result Date: 06/23/2018 CLINICAL DATA:  Subsequent treatment strategy for lung cancer. EXAM: NUCLEAR MEDICINE PET SKULL BASE TO THIGH TECHNIQUE: 7.2 mCi F-18 FDG was injected intravenously. Full-ring PET imaging was performed from the skull base to thigh after the radiotracer. CT data was obtained and used for attenuation correction and anatomic localization. Fasting blood glucose: 98 mg/dl COMPARISON:  PET-CT 10/25/2015 FINDINGS: Mediastinal blood pool activity: SUV max 1.01 NECK: No hypermetabolic lymph nodes in the neck. Incidental CT findings: none CHEST: Stable radiation changes involving the left paramediastinal lung but no  recurrent mass or hypermetabolism to suggest recurrent tumor. There is an enlarging 2.6 cm left lower lobe subpleural mass which is hypermetabolic with SUV max of 78.29. On the previous PET-CT this was elongated subpleural density with hypermetabolism with SUV max of 7.02. No new pulmonary nodules to suggest pulmonary metastatic disease. No pleural effusion or pleural nodules. Incidental CT findings: none ABDOMEN/PELVIS: No abnormal hypermetabolic activity within the liver, pancreas, adrenal glands, or spleen. No hypermetabolic lymph nodes in the abdomen or pelvis. Incidental CT findings: none SKELETON: No focal hypermetabolic activity to suggest skeletal metastasis. Incidental CT findings: Diffuse muscle uptake. IMPRESSION: 1. 2.6 cm left lower lobe lesion is hypermetabolic and consistent with recurrent/progressive tumor. 2. Stable radiation changes involving the left hilar mass without hypermetabolism to suggest recurrent or residual tumor. 3. No findings suspicious for metastatic disease involving the lungs, abdomen/pelvis or bony structures. Electronically Signed   By: Marijo Sanes M.D.   On: 06/23/2018 11:48    ASSESSMENT AND PLAN:  This is a very pleasant 57 years old African-American male with a stage IIIA non-small cell lung cancer, squamous cell carcinoma presented with large obstructing left upper lobe lung mass in addition to mediastinal lymphadenopathy diagnosed in February 2017 status post concurrent chemoradiation followed by consolidation chemotherapy. The patient has been in observation since 2017.  He is feeling fine with no concerning complaints.  Repeat CT scan of  the chest performed recently showed enlargement of rounded nodule in the left lower lobe.  This is suspicious for disease recurrence.   This was followed recently by a PET scan.  I personally and independently reviewed the scans and discussed the results with the patient.  The PET scan showed hypermetabolic activity in the 2.6 cm  left lower lobe lesion consistent with recurrent/progressive tumor.  There is no other suspicious finding for metastatic disease in the lung, abdomen and pelvis or bony structures. I had a lengthy discussion with the patient about his condition and treatment options. I recommended for the patient to see Dr. Sondra Come for consideration of curative radiotherapy to the recurrent disease in the left lower lobe. I will see him back for follow-up visit in 3 months for evaluation with repeat CT scan of the chest to monitor the response for his treatment and rule out any other disease progression. For smoking I strongly encouraged the patient to quit smoking and advised him against drug abuse too. The patient agreed to the current plan. He was advised to call immediately if he has any concerning symptoms in the interval. The patient voices understanding of current disease status and treatment options and is in agreement with the current care plan. All questions were answered. The patient knows to call the clinic with any problems, questions or concerns. We can certainly see the patient much sooner if necessary.  Disclaimer: This note was dictated with voice recognition software. Similar sounding words can inadvertently be transcribed and may not be corrected upon review.

## 2018-07-17 ENCOUNTER — Ambulatory Visit: Payer: Medicare Other

## 2018-07-17 ENCOUNTER — Ambulatory Visit
Admission: RE | Admit: 2018-07-17 | Discharge: 2018-07-17 | Disposition: A | Discharge status: Home / Self Care | Payer: Medicare Other | Source: Ambulatory Visit | Attending: Radiation Oncology | Admitting: Radiation Oncology

## 2018-07-23 ENCOUNTER — Ambulatory Visit
Admission: RE | Admit: 2018-07-23 | Discharge: 2018-07-23 | Disposition: A | Discharge status: Home / Self Care | Payer: Medicare Other | Source: Ambulatory Visit | Attending: Radiation Oncology | Admitting: Radiation Oncology

## 2018-07-23 ENCOUNTER — Other Ambulatory Visit: Payer: Self-pay

## 2018-07-23 ENCOUNTER — Encounter: Payer: Self-pay | Admitting: Radiation Oncology

## 2018-07-23 VITALS — BP 126/75 | HR 57 | Temp 98.2°F | Resp 18 | Ht 73.0 in | Wt 143.1 lb

## 2018-07-23 DIAGNOSIS — C3412 Malignant neoplasm of upper lobe, left bronchus or lung: Secondary | ICD-10-CM

## 2018-07-23 DIAGNOSIS — Z85118 Personal history of other malignant neoplasm of bronchus and lung: Secondary | ICD-10-CM | POA: Diagnosis not present

## 2018-07-23 DIAGNOSIS — C7802 Secondary malignant neoplasm of left lung: Secondary | ICD-10-CM | POA: Diagnosis not present

## 2018-07-23 DIAGNOSIS — Z510 Encounter for antineoplastic radiation therapy: Secondary | ICD-10-CM | POA: Insufficient documentation

## 2018-07-23 DIAGNOSIS — Z923 Personal history of irradiation: Secondary | ICD-10-CM | POA: Diagnosis not present

## 2018-07-23 DIAGNOSIS — C3492 Malignant neoplasm of unspecified part of left bronchus or lung: Secondary | ICD-10-CM | POA: Insufficient documentation

## 2018-07-23 NOTE — Progress Notes (Signed)
Radiation Oncology         [phone] ________________________________  Name: BADEN BETSCH MRN: [id number]  Date: 07/23/2018  DOB: July 07, 1961  Reevaluation Visit Note  CC: Patient, No Pcp Per  Curt Bears, MD    ICD-10-CM   1. Malignant neoplasm of upper lobe of left lung [facility]) C34.12 Ambulatory referral to Social Work    Diagnosis:   Stage IIIA (T2a, N2, M0) non-small cell lung cancer, squamous cell carcinoma diagnosed in February 2017 and presented with left upper lobe obstructing mass as well as questionable periaortic lymphadenopathy, now with recurrence in the left lower lung.   Interval Since Last Radiation:  2 years, 7 months  Radiation treatment dates:  10/17/2015-11/25/2015  Site/dose: 60 Gy in 30 fractions to the left chest  Narrative:  The patient returns today for routine follow-up.  he is doing well overall.   Since they were last seen in the office, they had a follow-up CT scan from his previous lung cancer on 03/19/18 when a 1.6 x 2.1 cm nodular opacity in the posterior left hemithorax was observed, progressive and worrisome for recurrence.   A second CT scan was obtained on 05/21/18 which showed persistent enlargement of rounded nodule along the pleural surface of the LEFT lower lobe adjacent to radiation treatment site. Differential includes rounded atelectasis versus lung carcinoma recurrence.   Accordingly, a PET scan was performed on 06/23/18 which revealed a 2.6 cm left lower lobe lesion is hypermetabolic and consistent with recurrent/progressive tumor. No findings suspicious for metastatic disease involving the lungs, abdomen/pelvis or bony structures.            On review of systems, he reports SOB and dizziness. he denies pain, headaches and any other symptoms. Pertinent positives are listed and detailed within the above HPI.                 ALLERGIES:  has No Known Allergies.  Meds: Current Outpatient Medications  Medication Sig Dispense Refill  .  acetaminophen (TYLENOL) 325 MG tablet Take 650 mg by mouth every 6 (six) hours as needed.    Marland Kitchen ibuprofen (ADVIL,MOTRIN) 200 MG tablet Take 800 mg by mouth every 6 (six) hours as needed for moderate pain. Reported on 01/05/2016    . naproxen sodium (ANAPROX) 220 MG tablet Take 220-440 mg by mouth every 12 (twelve) hours as needed (for pain).    . ranitidine (ZANTAC) 150 MG capsule Take 1 capsule (150 mg total) by mouth daily. (Patient not taking: Reported on 07/23/2018) 30 capsule 0   No current facility-administered medications for this encounter.    Facility-Administered Medications Ordered in Other Encounters  Medication Dose Route Frequency Provider Last Rate Last Dose  . diphenhydrAMINE (BENADRYL) injection 50 mg  50 mg Intravenous Once Curt Bears, MD        Physical Findings: The patient is in no acute distress. Patient is alert and oriented.  height is 6\' 1"  (1.854 m) and weight is 143 lb 2 oz (64.9 kg). His oral temperature is 98.2 F (36.8 C). His blood pressure is 126/75 and his pulse is 57 (abnormal). His respiration is 18 and oxygen saturation is 100%. . Lungs are clear to auscultation bilaterally. Heart has regular rate and rhythm. No palpable cervical, supraclavicular, or axillary adenopathy. Abdomen soft, non-tender, normal bowel sounds.   Lab Findings: Lab Results  Component Value Date   WBC 3.2 (L) 05/21/2018   HGB 15.2 05/21/2018   HCT 47.0 05/21/2018   MCV 96.5  05/21/2018   PLT 208 05/21/2018    Radiographic Findings: Nm Pet Image Restag (ps) Skull Base To Thigh  Result Date: 06/23/2018 CLINICAL DATA:  Subsequent treatment strategy for lung cancer. EXAM: NUCLEAR MEDICINE PET SKULL BASE TO THIGH TECHNIQUE: 7.2 mCi F-18 FDG was injected intravenously. Full-ring PET imaging was performed from the skull base to thigh after the radiotracer. CT data was obtained and used for attenuation correction and anatomic localization. Fasting blood glucose: 98 mg/dl COMPARISON:   PET-CT 10/25/2015 FINDINGS: Mediastinal blood pool activity: SUV max 1.01 NECK: No hypermetabolic lymph nodes in the neck. Incidental CT findings: none CHEST: Stable radiation changes involving the left paramediastinal lung but no recurrent mass or hypermetabolism to suggest recurrent tumor. There is an enlarging 2.6 cm left lower lobe subpleural mass which is hypermetabolic with SUV max of 82.80. On the previous PET-CT this was elongated subpleural density with hypermetabolism with SUV max of 7.02. No new pulmonary nodules to suggest pulmonary metastatic disease. No pleural effusion or pleural nodules. Incidental CT findings: none ABDOMEN/PELVIS: No abnormal hypermetabolic activity within the liver, pancreas, adrenal glands, or spleen. No hypermetabolic lymph nodes in the abdomen or pelvis. Incidental CT findings: none SKELETON: No focal hypermetabolic activity to suggest skeletal metastasis. Incidental CT findings: Diffuse muscle uptake. IMPRESSION: 1. 2.6 cm left lower lobe lesion is hypermetabolic and consistent with recurrent/progressive tumor. 2. Stable radiation changes involving the left hilar mass without hypermetabolism to suggest recurrent or residual tumor. 3. No findings suspicious for metastatic disease involving the lungs, abdomen/pelvis or bony structures. Electronically Signed   By: Marijo Sanes M.D.   On: 06/23/2018 11:48    Impression:  Recurrent non-small cell lung cancer.   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 He would be a good candidate for SBRT directed at this lesion. The lesion is in a different area from his previous lesion, so no concerns for overlap.  Today, I talked to the patient about the findings and work-up thus far.  We discussed the natural history of non-small cell lung cancer and general treatment, highlighting the role of radiotherapy (SBRT) in the management.  We discussed the available radiation techniques, and  focused on the details of logistics and delivery.  We reviewed the anticipated acute and late sequelae associated with radiation in this setting.  The patient was encouraged to ask questions that I answered to the best of my ability.  A patient consent form was discussed and signed.  We retained a copy for our records.  The patient would like to proceed with radiation and will be scheduled for CT simulation.  Plan:  SBRT simulation January 15 at [facility]. Anticipate between 3 and 5 SBRT treatments.  ____________________________________   [name], PhD, MD    This document serves as a record of services personally performed by Gery Pray, MD. It was created on his behalf by Mary-Margaret Loma Messing, a trained medical scribe. The creation of this record is based on the scribe's personal observations and the provider's statements to them. This document has been checked and approved by the attending provider.

## 2018-07-23 NOTE — Progress Notes (Signed)
Thoracic Location of Tumor / Histology: DIAGNOSIS: Stage IIIA (T2a, N2, M0) non-small cell lung cancer, squamous cell carcinoma diagnosed in February 2017 and presented with left upper lobe obstructing mass as well as questionable periaortic lymphadenopathy.   Tobacco/Marijuana/Snuff/ETOH use: Unfortunately he continues to smoke at regular basis.  He also has a history of drug abuse.  Past/Anticipated interventions by cardiothoracic surgery, if any: None at this time.  Past/Anticipated interventions by medical oncology, if any: 06/25/18 Per [name]: ASSESSMENT AND PLAN:  This is a very pleasant 58 years old African-American male with a stage IIIA non-small cell lung cancer, squamous cell carcinoma presented with large obstructing left upper lobe lung mass in addition to mediastinal lymphadenopathy diagnosed in February 2017 status post concurrent chemoradiation followed by consolidation chemotherapy. The patient has been in observation since 2017.  He is feeling fine with no concerning complaints.  Repeat CT scan of the chest performed recently showed enlargement of rounded nodule in the left lower lobe.  This is suspicious for disease recurrence.   This was followed recently by a PET scan.  I personally and independently reviewed the scans and discussed the results with the patient.  The PET scan showed hypermetabolic activity in the 2.6 cm left lower lobe lesion consistent with recurrent/progressive tumor.  There is no other suspicious finding for metastatic disease in the lung, abdomen and pelvis or bony structures. I had a lengthy discussion with the patient about his condition and treatment options. I recommended for the patient to see Dr. Sondra Come for consideration of curative radiotherapy to the recurrent disease in the left lower lobe. I will see him back for follow-up visit in 3 months for evaluation with repeat CT scan of the chest to monitor the response for his treatment and rule out any  other disease progression. For smoking I strongly encouraged the patient to quit smoking and advised him against drug abuse too. The patient agreed to the current plan. He was advised to call immediately if he has any concerning symptoms in the interval. The patient voices understanding of current disease status and treatment options and is in agreement with the current care plan. All questions were answered. The patient knows to call the clinic with any problems, questions or concerns. We can certainly see the patient much sooner if necessary.  Signs/Symptoms  Weight changes, if any: The patient has no significant weight loss or night sweats.  Respiratory complaints, if any: The patient is feeling fine today with no specific complaints except for mild fatigue and shortness of breath with exertion.  He denied having any chest pain but has mild cough with no hemoptysis.   Pain issues, if any:  Pt denies c/o pain  SAFETY ISSUES: Prior radiation? Radiation treatment dates:  10/17/2015-11/25/2015  Site/dose: 60 Gy in 30 fractions to the left chest   Beams/energy: 3D-Conformal Other/ 15X, 6X Photon  Pacemaker/ICD? No  Possible current pregnancy? N/A, pt is male  Is the patient on methotrexate? No  Current Complaints / other details:  Pt presents today for re-consult with Dr. Sondra Come for Radiation Oncology. Pt is unaccompanied. Pt scored 8/10 on distress screen. SW consult ordered per protocol.  BP 126/75 (BP Location: Left Arm, Patient Position: Sitting)   Pulse (!) 57   Temp 98.2 F (36.8 C) (Oral)   Resp 18   Ht 6\' 1"  (1.854 m)   Wt 143 lb 2 oz (64.9 kg)   SpO2 100%   BMI 18.88 kg/m   Wt Readings from  Last 3 Encounters:  07/23/18 143 lb 2 oz (64.9 kg)  06/25/18 143 lb 14.4 oz (65.3 kg)  05/27/18 145 lb 3.2 oz (65.9 kg)   Loma Sousa, RN BSN

## 2018-07-24 ENCOUNTER — Encounter: Payer: Self-pay | Admitting: General Practice

## 2018-07-24 NOTE — Progress Notes (Signed)
[facility] Psychosocial Distress Screening Clinical Social Work  Clinical Social Work was referred by distress screening protocol.  The patient scored a 8 on the Psychosocial Distress Thermometer which indicates moderate distress. Clinical Social Worker contacted patient by phone to assess for distress and other psychosocial needs. Unable to reach patient by phone, left VM w information about [facility] and encouragement to call as needed for support/resources.    ONCBCN DISTRESS SCREENING 07/23/2018  Screening Type Initial Screening  Distress experienced in past week (1-10) 8  Practical problem type Housing  Family Problem type Other (comment)  Emotional problem type Adjusting to appearance changes  Spiritual/Religous concerns type Relating to God  Information Concerns Type Lack of info about treatment  Physical Problem type Breathing  Physician notified of physical symptoms     Clinical Social Worker follow up needed: No.  If yes, follow up plan:  Beverely Pace, [place], LCSW Clinical Social Worker Phone:  [phone]

## 2018-07-30 ENCOUNTER — Ambulatory Visit
Admission: RE | Admit: 2018-07-30 | Discharge: 2018-07-30 | Disposition: A | Discharge status: Home / Self Care | Payer: Medicare Other | Source: Ambulatory Visit | Attending: Radiation Oncology | Admitting: Radiation Oncology

## 2018-07-30 DIAGNOSIS — C3412 Malignant neoplasm of upper lobe, left bronchus or lung: Secondary | ICD-10-CM | POA: Diagnosis not present

## 2018-07-30 DIAGNOSIS — C3492 Malignant neoplasm of unspecified part of left bronchus or lung: Secondary | ICD-10-CM

## 2018-07-30 DIAGNOSIS — Z510 Encounter for antineoplastic radiation therapy: Secondary | ICD-10-CM | POA: Diagnosis not present

## 2018-07-30 NOTE — Progress Notes (Signed)
  Radiation Oncology         [phone] ________________________________  Name: Maurice Mcknight MRN: [id number]  Date: 07/30/2018  DOB: [birth year]   STEREOTACTIC BODY RADIOTHERAPY SIMULATION AND TREATMENT PLANNING NOTE    DIAGNOSIS:  Stage IIIA (T2a, N2, M0) non-small cell lung cancer, squamous cell carcinoma diagnosed in February 2017 and presented with left upper lobe obstructing mass as well as questionable periaortic lymphadenopathy, now with recurrence in the left lower lung  NARRATIVE:  The patient was brought to the [facility].  Identity was confirmed.  All relevant records and images related to the planned course of therapy were reviewed.  The patient freely provided informed written consent to proceed with treatment after reviewing the details related to the planned course of therapy. The consent form was witnessed and verified by the simulation staff.  Then, the patient was set-up in a stable reproducible  supine position for radiation therapy.  A BodyFix immobilization pillow was fabricated for reproducible positioning.  Then I personally applied the abdominal compression paddle to limit respiratory excursion.  4D respiratoy motion management CT images were obtained.  Surface markings were placed.  The CT images were loaded into the planning software.  Then, using Cine, MIP, and standard views, the internal target volume (ITV) and planning target volumes (PTV) were delinieated, and avoidance structures were contoured.  Treatment planning then occurred.  The radiation prescription was entered and confirmed.  A total of two complex treatment devices were fabricated in the form of the BodyFix immobilization pillow and a neck accuform cushion.  I have requested : 3D Simulation  I have requested a DVH of the following structures: Heart, Lungs, Esophagus, Chest Wall, Brachial Plexus, Major Blood Vessels, and targets.  PLAN:  The patient will receive 54 Gy in 3  fractions.  -----------------------------------  [name], PhD, MD This document serves as a record of services personally performed by Gery Pray, MD. It was created on his behalf by Mary-Margaret Loma Messing, a trained medical scribe. The creation of this record is based on the scribe's personal observations and the provider's statements to them. This document has been checked and approved by the attending provider.

## 2018-08-04 DIAGNOSIS — C3412 Malignant neoplasm of upper lobe, left bronchus or lung: Secondary | ICD-10-CM | POA: Diagnosis not present

## 2018-08-04 DIAGNOSIS — Z510 Encounter for antineoplastic radiation therapy: Secondary | ICD-10-CM | POA: Diagnosis not present

## 2018-08-07 ENCOUNTER — Ambulatory Visit
Admission: RE | Admit: 2018-08-07 | Discharge: 2018-08-07 | Disposition: A | Discharge status: Home / Self Care | Payer: Medicare Other | Source: Ambulatory Visit | Attending: Radiation Oncology | Admitting: Radiation Oncology

## 2018-08-07 DIAGNOSIS — C3412 Malignant neoplasm of upper lobe, left bronchus or lung: Secondary | ICD-10-CM | POA: Diagnosis not present

## 2018-08-07 DIAGNOSIS — Z510 Encounter for antineoplastic radiation therapy: Secondary | ICD-10-CM | POA: Diagnosis not present

## 2018-08-07 DIAGNOSIS — C3492 Malignant neoplasm of unspecified part of left bronchus or lung: Secondary | ICD-10-CM

## 2018-08-07 NOTE — Progress Notes (Signed)
  Radiation Oncology         [phone] ________________________________  Name: Maurice Mcknight MRN: [id number]  Date: 08/07/2018  DOB: 1960-10-17  Stereotactic Body Radiotherapy Treatment Procedure Note  NARRATIVE:  Maurice Mcknight was brought to the stereotactic radiation treatment machine and placed supine on the CT couch. The patient was set up for stereotactic body radiotherapy on the body fix pillow.  3D TREATMENT PLANNING AND DOSIMETRY:  The patient's radiation plan was reviewed and approved prior to starting treatment.  It showed 3-dimensional radiation distributions overlaid onto the planning CT.  The [facility] for the target structures as well as the organs at risk were reviewed. The documentation of this is filed in the radiation oncology EMR.  SIMULATION VERIFICATION:  The patient underwent CT imaging on the treatment unit.  These were carefully aligned to document that the ablative radiation dose would cover the target volume and maximally spare the nearby organs at risk according to the planned distribution.  SPECIAL TREATMENT PROCEDURE: Maurice Mcknight received high dose ablative stereotactic body radiotherapy to the planned target volume without unforeseen complications. Treatment was delivered uneventfully. The high doses associated with stereotactic body radiotherapy and the significant potential risks require careful treatment set up and patient monitoring constituting a special treatment procedure   STEREOTACTIC TREATMENT MANAGEMENT:  Following delivery, the patient was evaluated clinically. The patient tolerated treatment without significant acute effects, and was discharged to home in stable condition.    PLAN: Continue treatment as planned.  ________________________________  [name], PhD, MD  This document serves as a record of services personally performed by Gery Pray, MD. It was created on his behalf by Rae Lips, a trained medical scribe. The creation  of this record is based on the scribe's personal observations and the provider's statements to them. This document has been checked and approved by the attending provider.

## 2018-08-11 ENCOUNTER — Ambulatory Visit: Payer: Medicare Other | Admitting: Radiation Oncology

## 2018-08-12 ENCOUNTER — Ambulatory Visit: Payer: Medicare Other | Admitting: Radiation Oncology

## 2018-08-12 ENCOUNTER — Ambulatory Visit
Admission: RE | Admit: 2018-08-12 | Discharge: 2018-08-12 | Disposition: A | Discharge status: Home / Self Care | Payer: Medicare Other | Source: Ambulatory Visit | Attending: Radiation Oncology | Admitting: Radiation Oncology

## 2018-08-12 DIAGNOSIS — Z510 Encounter for antineoplastic radiation therapy: Secondary | ICD-10-CM | POA: Diagnosis not present

## 2018-08-12 DIAGNOSIS — C3492 Malignant neoplasm of unspecified part of left bronchus or lung: Secondary | ICD-10-CM

## 2018-08-12 DIAGNOSIS — C3412 Malignant neoplasm of upper lobe, left bronchus or lung: Secondary | ICD-10-CM | POA: Diagnosis not present

## 2018-08-12 NOTE — Progress Notes (Signed)
  Radiation Oncology         [phone] ________________________________  Name: ADEOLUWA SILVERS MRN: [id number]  Date: 08/12/2018  DOB: 12-Dec-1960  Stereotactic Body Radiotherapy Treatment Procedure Note  NARRATIVE:  Kadarious Dikes Shira was brought to the stereotactic radiation treatment machine and placed supine on the CT couch. The patient was set up for stereotactic body radiotherapy on the body fix pillow.  3D TREATMENT PLANNING AND DOSIMETRY:  The patient's radiation plan was reviewed and approved prior to starting treatment.  It showed 3-dimensional radiation distributions overlaid onto the planning CT.  The [facility] for the target structures as well as the organs at risk were reviewed. The documentation of this is filed in the radiation oncology EMR.  SIMULATION VERIFICATION:  The patient underwent CT imaging on the treatment unit.  These were carefully aligned to document that the ablative radiation dose would cover the target volume and maximally spare the nearby organs at risk according to the planned distribution.  SPECIAL TREATMENT PROCEDURE: Rose Fillers Coiro received high dose ablative stereotactic body radiotherapy to the planned target volume without unforeseen complications. Treatment was delivered uneventfully. The high doses associated with stereotactic body radiotherapy and the significant potential risks require careful treatment set up and patient monitoring constituting a special treatment procedure   STEREOTACTIC TREATMENT MANAGEMENT:  Following delivery, the patient was evaluated clinically. The patient tolerated treatment without significant acute effects, and was discharged to home in stable condition.    PLAN: Continue treatment as planned.  ________________________________  [name], PhD, MD

## 2018-08-13 ENCOUNTER — Ambulatory Visit: Payer: Medicare Other | Admitting: Radiation Oncology

## 2018-08-14 ENCOUNTER — Ambulatory Visit
Admission: RE | Admit: 2018-08-14 | Discharge: 2018-08-14 | Disposition: A | Discharge status: Home / Self Care | Payer: Medicare Other | Source: Ambulatory Visit | Attending: Radiation Oncology | Admitting: Radiation Oncology

## 2018-08-14 ENCOUNTER — Ambulatory Visit: Payer: Medicare Other | Admitting: Radiation Oncology

## 2018-08-14 DIAGNOSIS — C3412 Malignant neoplasm of upper lobe, left bronchus or lung: Secondary | ICD-10-CM | POA: Diagnosis not present

## 2018-08-14 DIAGNOSIS — Z510 Encounter for antineoplastic radiation therapy: Secondary | ICD-10-CM | POA: Diagnosis not present

## 2018-08-14 DIAGNOSIS — C3492 Malignant neoplasm of unspecified part of left bronchus or lung: Secondary | ICD-10-CM

## 2018-08-14 NOTE — Progress Notes (Signed)
  Radiation Oncology         [phone] ________________________________  Name: Maurice Mcknight MRN: [id number]  Date: 08/14/2018  DOB: 18-Dec-1960  Stereotactic Body Radiotherapy Treatment Procedure Note  NARRATIVE:  Maurice Mcknight was brought to the stereotactic radiation treatment machine and placed supine on the CT couch. The patient was set up for stereotactic body radiotherapy on the body fix pillow.  3D TREATMENT PLANNING AND DOSIMETRY:  The patient's radiation plan was reviewed and approved prior to starting treatment.  It showed 3-dimensional radiation distributions overlaid onto the planning CT.  The [facility] for the target structures as well as the organs at risk were reviewed. The documentation of this is filed in the radiation oncology EMR.  SIMULATION VERIFICATION:  The patient underwent CT imaging on the treatment unit.  These were carefully aligned to document that the ablative radiation dose would cover the target volume and maximally spare the nearby organs at risk according to the planned distribution.  SPECIAL TREATMENT PROCEDURE: Maurice Mcknight received high dose ablative stereotactic body radiotherapy to the planned target volume without unforeseen complications. Treatment was delivered uneventfully. The high doses associated with stereotactic body radiotherapy and the significant potential risks require careful treatment set up and patient monitoring constituting a special treatment procedure   STEREOTACTIC TREATMENT MANAGEMENT:  Following delivery, the patient was evaluated clinically. The patient tolerated treatment without significant acute effects, and was discharged to home in stable condition.    PLAN: Continue treatment as planned.  ________________________________  [name], PhD, MD  This document serves as a record of services personally performed by Gery Pray, MD. It was created on his behalf by Rae Lips, a trained medical scribe. The creation  of this record is based on the scribe's personal observations and the provider's statements to them. This document has been checked and approved by the attending provider.

## 2018-08-15 ENCOUNTER — Ambulatory Visit: Payer: Medicare Other | Admitting: Radiation Oncology

## 2018-08-18 ENCOUNTER — Ambulatory Visit
Admission: RE | Admit: 2018-08-18 | Discharge: 2018-08-18 | Disposition: A | Discharge status: Home / Self Care | Payer: Medicare Other | Source: Ambulatory Visit | Attending: Radiation Oncology | Admitting: Radiation Oncology

## 2018-08-18 DIAGNOSIS — Z510 Encounter for antineoplastic radiation therapy: Secondary | ICD-10-CM | POA: Diagnosis not present

## 2018-08-18 DIAGNOSIS — C3412 Malignant neoplasm of upper lobe, left bronchus or lung: Secondary | ICD-10-CM | POA: Diagnosis not present

## 2018-08-18 DIAGNOSIS — C3492 Malignant neoplasm of unspecified part of left bronchus or lung: Secondary | ICD-10-CM

## 2018-08-18 NOTE — Progress Notes (Signed)
  Radiation Oncology         [phone]) [phone] ________________________________  Name: Maurice Mcknight MRN: [id number]  Date: 08/18/2018  DOB: 19-Apr-1961  Stereotactic Body Radiotherapy Treatment Procedure Note  NARRATIVE:  Maurice Mcknight was brought to the stereotactic radiation treatment machine and placed supine on the CT couch. The patient was set up for stereotactic body radiotherapy on the body fix pillow.  3D TREATMENT PLANNING AND DOSIMETRY:  The patient's radiation plan was reviewed and approved prior to starting treatment.  It showed 3-dimensional radiation distributions overlaid onto the planning CT.  The [facility] for the target structures as well as the organs at risk were reviewed. The documentation of this is filed in the radiation oncology EMR.  SIMULATION VERIFICATION:  The patient underwent CT imaging on the treatment unit.  These were carefully aligned to document that the ablative radiation dose would cover the target volume and maximally spare the nearby organs at risk according to the planned distribution.  SPECIAL TREATMENT PROCEDURE: Maurice Mcknight received high dose ablative stereotactic body radiotherapy to the planned target volume without unforeseen complications. Treatment was delivered uneventfully. The high doses associated with stereotactic body radiotherapy and the significant potential risks require careful treatment set up and patient monitoring constituting a special treatment procedure   STEREOTACTIC TREATMENT MANAGEMENT:  Following delivery, the patient was evaluated clinically. The patient tolerated treatment without significant acute effects, and was discharged to home in stable condition.    PLAN: Continue treatment as planned.  ________________________________  [name], PhD, MD  This document serves as a record of services personally performed by Gery Pray, MD. It was created on his behalf by Mary-Margaret Loma Messing, a trained medical scribe. The  creation of this record is based on the scribe's personal observations and the provider's statements to them. This document has been checked and approved by the attending provider.

## 2018-08-19 ENCOUNTER — Encounter: Payer: Self-pay | Admitting: Radiation Oncology

## 2018-08-19 ENCOUNTER — Ambulatory Visit: Payer: Medicare Other | Admitting: Radiation Oncology

## 2018-08-20 ENCOUNTER — Encounter: Payer: Self-pay | Admitting: Radiation Oncology

## 2018-08-20 ENCOUNTER — Ambulatory Visit
Admission: RE | Admit: 2018-08-20 | Discharge: 2018-08-20 | Disposition: A | Discharge status: Home / Self Care | Payer: Medicare Other | Source: Ambulatory Visit | Attending: Radiation Oncology | Admitting: Radiation Oncology

## 2018-08-20 DIAGNOSIS — Z510 Encounter for antineoplastic radiation therapy: Secondary | ICD-10-CM | POA: Diagnosis not present

## 2018-08-20 DIAGNOSIS — C3412 Malignant neoplasm of upper lobe, left bronchus or lung: Secondary | ICD-10-CM | POA: Diagnosis not present

## 2018-08-20 NOTE — Progress Notes (Signed)
  Radiation Oncology         ([phone]) [phone] ________________________________  Name: Maurice Mcknight MRN: [id number]  Date: 08/20/2018  DOB: Jan 29, 1961  Stereotactic Body Radiotherapy Treatment Procedure Note  NARRATIVE:  Maurice Mcknight was brought to the stereotactic radiation treatment machine and placed supine on the CT couch. The patient was set up for stereotactic body radiotherapy on the body fix pillow.  3D TREATMENT PLANNING AND DOSIMETRY:  The patient's radiation plan was reviewed and approved prior to starting treatment.  It showed 3-dimensional radiation distributions overlaid onto the planning CT.  The [facility] for the target structures as well as the organs at risk were reviewed. The documentation of this is filed in the radiation oncology EMR.  SIMULATION VERIFICATION:  The patient underwent CT imaging on the treatment unit.  These were carefully aligned to document that the ablative radiation dose would cover the target volume and maximally spare the nearby organs at risk according to the planned distribution.  SPECIAL TREATMENT PROCEDURE: Maurice Mcknight received high dose ablative stereotactic body radiotherapy to the planned target volume without unforeseen complications. Treatment was delivered uneventfully. The high doses associated with stereotactic body radiotherapy and the significant potential risks require careful treatment set up and patient monitoring constituting a special treatment procedure   STEREOTACTIC TREATMENT MANAGEMENT:  Following delivery, the patient was evaluated clinically. The patient tolerated treatment without significant acute effects, and was discharged to home in stable condition.    PLAN: Continue treatment as planned.  ________________________________  [name], PhD, MD   This document serves as a record of services personally performed by Gery Pray, MD. It was created on his behalf by Mary-Margaret Loma Messing, a trained medical scribe. The  creation of this record is based on the scribe's personal observations and the provider's statements to them. This document has been checked and approved by the attending provider.

## 2018-08-21 NOTE — Progress Notes (Signed)
  Radiation Oncology         [phone]) [phone] ________________________________  Name: ARISTIDES LUCKEY MRN: [id number]  Date: 08/20/2018  DOB: Jan 29, 1961  End of Treatment Note  Diagnosis:   58 y.o. male with Stage IIIA (T2a, N2, M0) non-small cell lung cancer, squamous cell carcinoma diagnosed in February 2017 and presented with left upper lobe obstructing mass as well as questionable periaortic lymphadenopathy, now with recurrence in the left lower lung   Indication for treatment:  Curative       Radiation treatment dates:   08/07/2018, 08/12/2018, 08/14/2018, 08/18/2018, 08/20/2018  Site/dose:   The tumor in the left lung was treated with a course of stereotactic body radiation treatment. The patient received 50 Gy in 5 fractions at 10 Gy per fraction.  Beams/energy:   SBRT/SRT-VMAT // 6X-FFF Photon  Narrative: The patient tolerated radiation treatment relatively well.   The patient did not have any signs of acute toxicity during treatment.  Plan: The patient has completed radiation treatment. The patient will return to radiation oncology clinic for routine followup in one month. I advised the patient to call or return sooner if they have any questions or concerns related to their recovery or treatment.   ------------------------------------------------  [name], PhD, MD  This document serves as a record of services personally performed by Gery Pray, MD. It was created on his behalf by Rae Lips, a trained medical scribe. The creation of this record is based on the scribe's personal observations and the provider's statements to them. This document has been checked and approved by the attending provider.

## 2018-09-17 ENCOUNTER — Telehealth: Payer: Self-pay | Admitting: *Deleted

## 2018-09-17 ENCOUNTER — Inpatient Hospital Stay: Payer: Medicare Other

## 2018-09-17 ENCOUNTER — Telehealth: Payer: Self-pay | Admitting: Internal Medicine

## 2018-09-17 ENCOUNTER — Ambulatory Visit (HOSPITAL_COMMUNITY): Admission: RE | Admit: 2018-09-17 | Payer: Medicare Other | Source: Ambulatory Visit

## 2018-09-17 NOTE — Telephone Encounter (Signed)
R/s appt per 3/4 sch message - pt aware of new appts time and will need to call central radiology

## 2018-09-17 NOTE — Telephone Encounter (Signed)
Received call from [facility] in Radiology. Pt was to have had CT scan today but did not show.  He also did not show for labs prior to scan. Attempted call to patient but number listed was not identified as pt's phone when it went to vm.  TCT pt's listed contact # , Maurice Mcknight. Maurice Mcknight will speak with patient tomorrow and have him call to reschedule lab and scan before 09/24/18 when pt sees Maurice Mcknight.

## 2018-09-19 ENCOUNTER — Telehealth: Payer: Self-pay | Admitting: Medical Oncology

## 2018-09-19 NOTE — Telephone Encounter (Signed)
Gave pt number to call central scheduling now to schedule CT for 3/11 after labs

## 2018-09-22 ENCOUNTER — Other Ambulatory Visit: Payer: Self-pay

## 2018-09-22 ENCOUNTER — Encounter: Payer: Self-pay | Admitting: Radiation Oncology

## 2018-09-22 ENCOUNTER — Ambulatory Visit
Admission: RE | Admit: 2018-09-22 | Discharge: 2018-09-22 | Disposition: A | Discharge status: Home / Self Care | Payer: Medicare Other | Source: Ambulatory Visit | Attending: Radiation Oncology | Admitting: Radiation Oncology

## 2018-09-22 VITALS — BP 132/80 | HR 65 | Temp 98.1°F | Resp 20 | Ht 73.0 in | Wt 143.6 lb

## 2018-09-22 DIAGNOSIS — Z923 Personal history of irradiation: Secondary | ICD-10-CM | POA: Insufficient documentation

## 2018-09-22 DIAGNOSIS — C7802 Secondary malignant neoplasm of left lung: Secondary | ICD-10-CM | POA: Insufficient documentation

## 2018-09-22 DIAGNOSIS — C3412 Malignant neoplasm of upper lobe, left bronchus or lung: Secondary | ICD-10-CM | POA: Diagnosis not present

## 2018-09-22 DIAGNOSIS — C3492 Malignant neoplasm of unspecified part of left bronchus or lung: Secondary | ICD-10-CM

## 2018-09-22 NOTE — Progress Notes (Signed)
Radiation Oncology         (336) [phone] ________________________________  Name: Maurice Mcknight MRN: [id number]  Date: 09/22/2018  DOB: 1961-06-12  Follow-Up Visit Note  CC: Patient, No Pcp Per  Curt Bears, MD    ICD-10-CM   1. Malignant neoplasm of upper lobe of left lung [facility]) C34.12     Diagnosis:   58 y.o. male with Stage IIIA (T2a, N2, M0) non-small cell lung cancer, squamous cell carcinoma diagnosed in February 2017 and presented with left upper lobe obstructing mass as well as questionable periaortic lymphadenopathy, now with recurrence in the left lower lung       Interval Since Last Radiation:  1 months  Radiation treatment dates:   08/07/2018, 08/12/2018, 08/14/2018, 08/18/2018, 08/20/2018  Site/dose:   The tumor in the left lung was treated with a course of stereotactic body radiation treatment. The patient received 50 Gy in 5 fractions at 10 Gy per fraction.   Narrative:  The patient returns today for routine follow-up.  he is doing well overall. He is scheduled for a CT scan on Wednesday. He will see [name] for follow-up on Thursday. He denies experiencing any radiation side effects.              On review of systems, he reports cough. he denies hemoptysis and any other symptoms. Pertinent positives are listed and detailed within the above HPI.                 ALLERGIES:  has No Known Allergies.  Meds: Current Outpatient Medications  Medication Sig Dispense Refill  . acetaminophen (TYLENOL) 325 MG tablet Take 650 mg by mouth every 6 (six) hours as needed.    Marland Kitchen ibuprofen (ADVIL,MOTRIN) 200 MG tablet Take 800 mg by mouth every 6 (six) hours as needed for moderate pain. Reported on 01/05/2016    . naproxen sodium (ANAPROX) 220 MG tablet Take 220-440 mg by mouth every 12 (twelve) hours as needed (for pain).    . ranitidine (ZANTAC) 150 MG capsule Take 1 capsule (150 mg total) by mouth daily. 30 capsule 0   No current facility-administered medications for this  encounter.    Facility-Administered Medications Ordered in Other Encounters  Medication Dose Route Frequency Provider Last Rate Last Dose  . diphenhydrAMINE (BENADRYL) injection 50 mg  50 mg Intravenous Once Curt Bears, MD        Physical Findings: The patient is in no acute distress. Patient is alert and oriented.  height is 6\' 1"  (1.854 m) and weight is 143 lb 9.6 oz (65.1 kg). His oral temperature is 98.1 F (36.7 C). His blood pressure is 132/80 and his pulse is 65. His respiration is 20 and oxygen saturation is 100%. .  No significant changes. Lungs are clear to auscultation bilaterally. Heart has regular rate and rhythm. No palpable cervical, supraclavicular, or axillary adenopathy. Abdomen soft, non-tender, normal bowel sounds.   Lab Findings: Lab Results  Component Value Date   WBC 3.2 (L) 05/21/2018   HGB 15.2 05/21/2018   HCT 47.0 05/21/2018   MCV 96.5 05/21/2018   PLT 208 05/21/2018    Radiographic Findings: No results found.  Impression:  .  He is doing well. Scans later this week.  Plan:  PRN follow-up in radiation oncology. Pt will continue close follow-up in medical oncology.   ____________________________________   [name], PhD, MD    This document serves as a record of services personally performed by Gery Pray, MD.  It was created on his behalf by Wyn Quaker, a trained medical scribe. The creation of this record is based on the scribe's personal observations and the provider's statements to them. This document has been checked and approved by the attending provider.

## 2018-09-22 NOTE — Progress Notes (Signed)
Pt here today for a follow-up for lung cancer. Pt denies having pain. Pt states mild fatigue. Pt states having a dry cough. Pt denies having hemoptysis. Pt states mild shortness of breath on exertion. Pt denies having any skin changes. Pt denies having any painful or difficult swallowing. Pt states that he is not on chemotherapy treatments.  BP 132/80 (BP Location: Left Arm, Patient Position: Sitting)   Pulse 65   Temp 98.1 F (36.7 C) (Oral)   Resp 20   Ht 6\' 1"  (1.854 m)   Wt 143 lb 9.6 oz (65.1 kg)   SpO2 100%   BMI 18.95 kg/m    Wt Readings from Last 3 Encounters:  09/22/18 143 lb 9.6 oz (65.1 kg)  07/23/18 143 lb 2 oz (64.9 kg)  06/25/18 143 lb 14.4 oz (65.3 kg)

## 2018-09-24 ENCOUNTER — Ambulatory Visit (HOSPITAL_COMMUNITY)
Admission: RE | Admit: 2018-09-24 | Discharge: 2018-09-24 | Disposition: A | Discharge status: Home / Self Care | Payer: Medicare Other | Source: Ambulatory Visit | Attending: Internal Medicine | Admitting: Internal Medicine

## 2018-09-24 ENCOUNTER — Encounter (HOSPITAL_COMMUNITY): Payer: Self-pay

## 2018-09-24 ENCOUNTER — Inpatient Hospital Stay: Payer: Medicare Other | Admitting: Internal Medicine

## 2018-09-24 ENCOUNTER — Other Ambulatory Visit: Payer: Self-pay

## 2018-09-24 ENCOUNTER — Inpatient Hospital Stay: Payer: Medicare Other | Attending: Internal Medicine

## 2018-09-24 DIAGNOSIS — C3412 Malignant neoplasm of upper lobe, left bronchus or lung: Secondary | ICD-10-CM | POA: Insufficient documentation

## 2018-09-24 DIAGNOSIS — D491 Neoplasm of unspecified behavior of respiratory system: Secondary | ICD-10-CM | POA: Diagnosis not present

## 2018-09-24 DIAGNOSIS — Z923 Personal history of irradiation: Secondary | ICD-10-CM | POA: Insufficient documentation

## 2018-09-24 DIAGNOSIS — C7802 Secondary malignant neoplasm of left lung: Secondary | ICD-10-CM | POA: Insufficient documentation

## 2018-09-24 DIAGNOSIS — C3490 Malignant neoplasm of unspecified part of unspecified bronchus or lung: Secondary | ICD-10-CM | POA: Insufficient documentation

## 2018-09-24 DIAGNOSIS — F17210 Nicotine dependence, cigarettes, uncomplicated: Secondary | ICD-10-CM | POA: Diagnosis not present

## 2018-09-24 DIAGNOSIS — Z9221 Personal history of antineoplastic chemotherapy: Secondary | ICD-10-CM | POA: Diagnosis not present

## 2018-09-24 DIAGNOSIS — Z79899 Other long term (current) drug therapy: Secondary | ICD-10-CM | POA: Insufficient documentation

## 2018-09-24 DIAGNOSIS — E46 Unspecified protein-calorie malnutrition: Secondary | ICD-10-CM | POA: Insufficient documentation

## 2018-09-24 LAB — CBC WITH DIFFERENTIAL (CANCER CENTER ONLY)
Abs Immature Granulocytes: 0.01 10*3/uL (ref 0.00–0.07)
Basophils Absolute: 0 10*3/uL (ref 0.0–0.1)
Basophils Relative: 1 %
Eosinophils Absolute: 0.1 10*3/uL (ref 0.0–0.5)
Eosinophils Relative: 5 %
HEMATOCRIT: 43.8 % (ref 39.0–52.0)
Hemoglobin: 14.1 g/dL (ref 13.0–17.0)
Immature Granulocytes: 0 %
LYMPHS ABS: 0.8 10*3/uL (ref 0.7–4.0)
LYMPHS PCT: 32 %
MCH: 30.7 pg (ref 26.0–34.0)
MCHC: 32.2 g/dL (ref 30.0–36.0)
MCV: 95.4 fL (ref 80.0–100.0)
MONOS PCT: 10 %
Monocytes Absolute: 0.3 10*3/uL (ref 0.1–1.0)
Neutro Abs: 1.3 10*3/uL — ABNORMAL LOW (ref 1.7–7.7)
Neutrophils Relative %: 52 %
Platelet Count: 185 10*3/uL (ref 150–400)
RBC: 4.59 MIL/uL (ref 4.22–5.81)
RDW: 12.5 % (ref 11.5–15.5)
WBC Count: 2.5 10*3/uL — ABNORMAL LOW (ref 4.0–10.5)
nRBC: 0 % (ref 0.0–0.2)

## 2018-09-24 LAB — CMP (CANCER CENTER ONLY)
ALT: 6 U/L (ref 0–44)
AST: 17 U/L (ref 15–41)
Albumin: 3.4 g/dL — ABNORMAL LOW (ref 3.5–5.0)
Alkaline Phosphatase: 84 U/L (ref 38–126)
Anion gap: 8 (ref 5–15)
BUN: 7 mg/dL (ref 6–20)
CO2: 28 mmol/L (ref 22–32)
Calcium: 8.2 mg/dL — ABNORMAL LOW (ref 8.9–10.3)
Chloride: 101 mmol/L (ref 98–111)
Creatinine: 1 mg/dL (ref 0.61–1.24)
GFR, Est AFR Am: >60 mL/min (ref >60)
GFR, Estimated: >60 mL/min (ref >60)
GLUCOSE: 118 mg/dL — AB (ref 70–99)
Potassium: 3.5 mmol/L (ref 3.5–5.1)
Sodium: 137 mmol/L (ref 135–145)
Total Bilirubin: 0.7 mg/dL (ref 0.3–1.2)
Total Protein: 7.4 g/dL (ref 6.5–8.1)

## 2018-09-24 MED ORDER — SODIUM CHLORIDE (PF) 0.9 % IJ SOLN
INTRAMUSCULAR | Status: AC
  Filled 2018-09-24: qty 50

## 2018-09-24 MED ORDER — IOHEXOL 300 MG/ML  SOLN
75.0000 mL | Freq: Once | INTRAMUSCULAR | Status: AC | PRN
  Administered 2018-09-24: 75 mL via INTRAVENOUS

## 2018-10-02 ENCOUNTER — Other Ambulatory Visit: Payer: Self-pay

## 2018-10-02 ENCOUNTER — Inpatient Hospital Stay (HOSPITAL_BASED_OUTPATIENT_CLINIC_OR_DEPARTMENT_OTHER): Payer: Medicare Other | Admitting: Internal Medicine

## 2018-10-02 ENCOUNTER — Encounter: Payer: Self-pay | Admitting: Internal Medicine

## 2018-10-02 VITALS — BP 125/89 | HR 62 | Temp 98.5°F | Resp 18 | Ht 73.0 in | Wt 145.2 lb

## 2018-10-02 DIAGNOSIS — F17210 Nicotine dependence, cigarettes, uncomplicated: Secondary | ICD-10-CM

## 2018-10-02 DIAGNOSIS — Z923 Personal history of irradiation: Secondary | ICD-10-CM | POA: Diagnosis not present

## 2018-10-02 DIAGNOSIS — Z79899 Other long term (current) drug therapy: Secondary | ICD-10-CM

## 2018-10-02 DIAGNOSIS — E46 Unspecified protein-calorie malnutrition: Secondary | ICD-10-CM | POA: Diagnosis not present

## 2018-10-02 DIAGNOSIS — Z716 Tobacco abuse counseling: Secondary | ICD-10-CM

## 2018-10-02 DIAGNOSIS — C3492 Malignant neoplasm of unspecified part of left bronchus or lung: Secondary | ICD-10-CM

## 2018-10-02 DIAGNOSIS — Z9221 Personal history of antineoplastic chemotherapy: Secondary | ICD-10-CM | POA: Diagnosis not present

## 2018-10-02 DIAGNOSIS — C7802 Secondary malignant neoplasm of left lung: Secondary | ICD-10-CM

## 2018-10-02 DIAGNOSIS — C3412 Malignant neoplasm of upper lobe, left bronchus or lung: Secondary | ICD-10-CM | POA: Diagnosis not present

## 2018-10-02 DIAGNOSIS — F17200 Nicotine dependence, unspecified, uncomplicated: Secondary | ICD-10-CM

## 2018-10-02 DIAGNOSIS — C3490 Malignant neoplasm of unspecified part of unspecified bronchus or lung: Secondary | ICD-10-CM

## 2018-10-02 NOTE — Progress Notes (Signed)
[place] Telephone:([phone]) [phone]   Fax:([phone]) [phone]  OFFICE PROGRESS NOTE  Patient, No Pcp Per No address on file  DIAGNOSIS: Stage IIIA (T2a, N2, M0) non-small cell lung cancer, squamous cell carcinoma diagnosed in February 2017 and presented with left upper lobe obstructing mass as well as questionable periaortic lymphadenopathy.  PRIOR THERAPY:  1) Concurrent chemoradiation with weekly carboplatin for AUC of 2 and paclitaxel 45 MG/M2. First treatment was given on 10/10/2015. Status post 5 weeks of treatment. Last dose was given 11/14/2015 with partial response. 2) Consolidation systemic chemotherapy with carboplatin for AUC of 5 and paclitaxel 175 MG/M2 every 3 weeks with Neulasta support. First dose 01/25/2016. Status post 3 cycles. 3) status post curative radiotherapy to recurrent left lower lobe lung cancer under the care of Dr. Sondra Come completed on August 20, 2018  CURRENT THERAPY: Observation.  INTERVAL HISTORY: Maurice Mcknight 58 y.o. male returns to the clinic today for follow-up visit.  The patient is feeling fine today with no concerning complaints except for fatigue and shortness of breath with exertion.  Unfortunately he continues to smoke and also he continues to doing drugs.  He has no chest pain, cough or hemoptysis.  He denied having any recent weight loss or night sweats.  He has no nausea, vomiting, diarrhea or constipation.  He has no headache or visual changes.  He tolerated the previous SBRT to the left lower lobe lung mass fairly well.  The patient had repeat CT scan of the chest performed recently and he is here for evaluation and discussion of his scan results.   MEDICAL HISTORY: Past Medical History:  Diagnosis Date  . Encounter for antineoplastic chemotherapy 10/17/2015  . Fever and chills 04/11/2016  . lung ca dx'd 09/2015  . Malnutrition ([place]) 09/28/2015  . Needs smoking cessation education 09/28/2015  . Radiation 10/17/15-11/25/15   left  chest 60 Gy    ALLERGIES:  has No Known Allergies.  MEDICATIONS:  Current Outpatient Medications  Medication Sig Dispense Refill  . acetaminophen (TYLENOL) 325 MG tablet Take 650 mg by mouth every 6 (six) hours as needed.    Marland Kitchen ibuprofen (ADVIL,MOTRIN) 200 MG tablet Take 800 mg by mouth every 6 (six) hours as needed for moderate pain. Reported on 01/05/2016    . naproxen sodium (ANAPROX) 220 MG tablet Take 220-440 mg by mouth every 12 (twelve) hours as needed (for pain).    . ranitidine (ZANTAC) 150 MG capsule Take 1 capsule (150 mg total) by mouth daily. 30 capsule 0   No current facility-administered medications for this visit.    Facility-Administered Medications Ordered in Other Visits  Medication Dose Route Frequency Provider Last Rate Last Dose  . diphenhydrAMINE (BENADRYL) injection 50 mg  50 mg Intravenous Once Curt Bears, MD        SURGICAL HISTORY:  Past Surgical History:  Procedure Laterality Date  . VIDEO BRONCHOSCOPY Bilateral 09/09/2015   Diagnosis    REVIEW OF SYSTEMS:  A comprehensive review of systems was negative except for: Constitutional: positive for fatigue Respiratory: positive for dyspnea on exertion   PHYSICAL EXAMINATION: General appearance: alert, cooperative, fatigued and no distress Head: Normocephalic, without obvious abnormality, atraumatic Neck: no adenopathy, no JVD, supple, symmetrical, trachea midline and thyroid not enlarged, symmetric, no tenderness/mass/nodules Lymph nodes: Cervical, supraclavicular, and axillary nodes normal. Resp: clear to auscultation bilaterally Back: symmetric, no curvature. ROM normal. No CVA tenderness. Cardio: regular rate and rhythm, S1, S2 normal, no murmur, click, rub  or gallop GI: soft, non-tender; bowel sounds normal; no masses,  no organomegaly Extremities: extremities normal, atraumatic, no cyanosis or edema  ECOG PERFORMANCE STATUS: 1 - Symptomatic but completely ambulatory  Blood pressure 125/89,  pulse 62, temperature 98.5 F (36.9 C), temperature source Oral, resp. rate 18, height 6\' 1"  (1.854 m), weight 145 lb 3.2 oz (65.9 kg), SpO2 100 %.  LABORATORY DATA: Lab Results  Component Value Date   WBC 2.5 (L) 09/24/2018   HGB 14.1 09/24/2018   HCT 43.8 09/24/2018   MCV 95.4 09/24/2018   PLT 185 09/24/2018      Chemistry      Component Value Date/Time   NA 137 09/24/2018 0955   NA 141 01/21/2017 0753   K 3.5 09/24/2018 0955   K 4.2 01/21/2017 0753   CL 101 09/24/2018 0955   CO2 28 09/24/2018 0955   CO2 26 01/21/2017 0753   BUN 7 09/24/2018 0955   BUN 10.1 01/21/2017 0753   CREATININE 1.00 09/24/2018 0955   CREATININE 1.0 01/21/2017 0753      Component Value Date/Time   CALCIUM 8.2 (L) 09/24/2018 0955   CALCIUM 8.8 01/21/2017 0753   ALKPHOS 84 09/24/2018 0955   ALKPHOS 130 01/21/2017 0753   AST 17 09/24/2018 0955   AST 19 01/21/2017 0753   ALT 6 09/24/2018 0955   ALT 11 01/21/2017 0753   BILITOT 0.7 09/24/2018 0955   BILITOT 0.24 01/21/2017 0753       RADIOGRAPHIC STUDIES: Ct Chest W Contrast  Result Date: 09/24/2018 CLINICAL DATA:  Stage IIIA central left upper lobe lung cancer diagnosed February 2017 status post concurrent chemoradiation therapy and consolidation systemic chemotherapy. Hypermetabolic recurrence in the left lung status post interval radiation therapy. Restaging. EXAM: CT CHEST WITH CONTRAST TECHNIQUE: Multidetector CT imaging of the chest was performed during intravenous contrast administration. CONTRAST:  76mL OMNIPAQUE IOHEXOL 300 MG/ML  SOLN COMPARISON:  06/23/2018 PET-CT.  05/21/2018 chest CT. FINDINGS: Cardiovascular: Normal heart size. No significant pericardial effusion/thickening. Left anterior descending coronary atherosclerosis. 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 Normal caliber pulmonary arteries. No central pulmonary emboli.  Mediastinum/Nodes: No discrete thyroid nodules. Stable mildly patulous upper thoracic esophagus with small fluid level. No pathologically enlarged axillary, mediastinal or hilar lymph nodes. Lungs/Pleura: No pneumothorax. No pleural effusion. Sharply marginated upper left parahilar consolidation with associated volume loss, bronchiectasis and distortion, compatible with radiation fibrosis. Previously described solid 2.7 x 1.6 cm posterior left lower lobe pulmonary nodule has decreased in size to 1.8 x 1.3 cm with near complete central cavitary change (series 7/image 82). No interval consolidative airspace disease. No new significant pulmonary nodules. Upper abdomen: No acute abnormality. Musculoskeletal:  No aggressive appearing focal osseous lesions. IMPRESSION: 1. Positive response to therapy. The recurrent tumor in the posterior left lower lobe is decreased in size with near complete central cavitary change. 2. No new or progressive metastatic disease. 3. Stable left upper perihilar radiation fibrosis. Electronically Signed   By: Ilona Sorrel M.D.   On: 09/24/2018 16:03    ASSESSMENT AND PLAN:  This is a very pleasant 58 years old African-American male with recurrent non-small cell lung cancer initially diagnosed as a stage IIIA non-small cell lung cancer, squamous cell carcinoma presented with large obstructing left upper lobe lung mass in addition to mediastinal lymphadenopathy diagnosed in February 2017 status post concurrent chemoradiation followed by consolidation chemotherapy. The patient has been in observation since 2017.  He had recurrent disease with new left lower lobe lung nodule in December 2019. The patient underwent curative SBRT to this nodule under the care of Dr. Sondra Come and he tolerated it fairly well. He had repeat CT scan of the chest performed recently.  I personally and independently reviewed the scans and discussed the results with the patient today. His scan showed good response  to the curative radiotherapy of the left lower lobe lung nodule. I recommended for the patient to continue on observation with repeat CT scan of the chest in 4 months. I strongly encouraged the patient to quit smoking and drug abuse. He was advised to call immediately if he has any concerning symptoms in the interval. The patient voices understanding of current disease status and treatment options and is in agreement with the current care plan. All questions were answered. The patient knows to call the clinic with any problems, questions or concerns. We can certainly see the patient much sooner if necessary.  Disclaimer: This note was dictated with voice recognition software. Similar sounding words can inadvertently be transcribed and may not be corrected upon review.

## 2018-10-06 ENCOUNTER — Telehealth: Payer: Self-pay | Admitting: *Deleted

## 2018-10-06 ENCOUNTER — Telehealth: Payer: Self-pay | Admitting: Internal Medicine

## 2018-10-06 NOTE — Telephone Encounter (Signed)
Clinical Social Work contacted patient by phone to "check in" and assess for practical needs during current [place] situation. CSW encouraged patient to call with any questions or concerns.  Maryjean Morn, MSW, LCSW, OSW-C Clinical Social Worker [facility] [phone]

## 2018-10-06 NOTE — Telephone Encounter (Signed)
Left message re July appointments.  Schedule mailed. Central radiology will call re scan.

## 2019-01-30 ENCOUNTER — Other Ambulatory Visit: Payer: Self-pay

## 2019-01-30 ENCOUNTER — Ambulatory Visit (HOSPITAL_COMMUNITY)
Admission: RE | Admit: 2019-01-30 | Discharge: 2019-01-30 | Disposition: A | Discharge status: Home / Self Care | Payer: Medicare Other | Source: Ambulatory Visit | Attending: Internal Medicine | Admitting: Internal Medicine

## 2019-01-30 ENCOUNTER — Inpatient Hospital Stay: Payer: Medicare Other | Attending: Internal Medicine

## 2019-01-30 DIAGNOSIS — F17210 Nicotine dependence, cigarettes, uncomplicated: Secondary | ICD-10-CM | POA: Insufficient documentation

## 2019-01-30 DIAGNOSIS — Z9221 Personal history of antineoplastic chemotherapy: Secondary | ICD-10-CM | POA: Diagnosis not present

## 2019-01-30 DIAGNOSIS — C3490 Malignant neoplasm of unspecified part of unspecified bronchus or lung: Secondary | ICD-10-CM | POA: Diagnosis not present

## 2019-01-30 DIAGNOSIS — J9859 Other diseases of mediastinum, not elsewhere classified: Secondary | ICD-10-CM | POA: Diagnosis not present

## 2019-01-30 DIAGNOSIS — Z923 Personal history of irradiation: Secondary | ICD-10-CM | POA: Insufficient documentation

## 2019-01-30 DIAGNOSIS — C3412 Malignant neoplasm of upper lobe, left bronchus or lung: Secondary | ICD-10-CM | POA: Insufficient documentation

## 2019-01-30 DIAGNOSIS — R634 Abnormal weight loss: Secondary | ICD-10-CM | POA: Insufficient documentation

## 2019-01-30 DIAGNOSIS — Z79899 Other long term (current) drug therapy: Secondary | ICD-10-CM | POA: Diagnosis not present

## 2019-01-30 DIAGNOSIS — C3492 Malignant neoplasm of unspecified part of left bronchus or lung: Secondary | ICD-10-CM | POA: Diagnosis not present

## 2019-01-30 LAB — CBC WITH DIFFERENTIAL (CANCER CENTER ONLY)
Abs Immature Granulocytes: 0 10*3/uL (ref 0.00–0.07)
Basophils Absolute: 0 10*3/uL (ref 0.0–0.1)
Basophils Relative: 2 %
Eosinophils Absolute: 0.1 10*3/uL (ref 0.0–0.5)
Eosinophils Relative: 5 %
HCT: 45.5 % (ref 39.0–52.0)
Hemoglobin: 15.2 g/dL (ref 13.0–17.0)
Immature Granulocytes: 0 %
Lymphocytes Relative: 36 %
Lymphs Abs: 0.9 10*3/uL (ref 0.7–4.0)
MCH: 31.3 pg (ref 26.0–34.0)
MCHC: 33.4 g/dL (ref 30.0–36.0)
MCV: 93.8 fL (ref 80.0–100.0)
Monocytes Absolute: 0.4 10*3/uL (ref 0.1–1.0)
Monocytes Relative: 16 %
Neutro Abs: 1.1 10*3/uL — ABNORMAL LOW (ref 1.7–7.7)
Neutrophils Relative %: 41 %
Platelet Count: 183 10*3/uL (ref 150–400)
RBC: 4.85 MIL/uL (ref 4.22–5.81)
RDW: 11.9 % (ref 11.5–15.5)
WBC Count: 2.6 10*3/uL — ABNORMAL LOW (ref 4.0–10.5)
nRBC: 0 % (ref 0.0–0.2)

## 2019-01-30 LAB — CMP (CANCER CENTER ONLY)
ALT: 10 U/L (ref 0–44)
AST: 20 U/L (ref 15–41)
Albumin: 3.6 g/dL (ref 3.5–5.0)
Alkaline Phosphatase: 94 U/L (ref 38–126)
Anion gap: 8 (ref 5–15)
BUN: 8 mg/dL (ref 6–20)
CO2: 28 mmol/L (ref 22–32)
Calcium: 8.5 mg/dL — ABNORMAL LOW (ref 8.9–10.3)
Chloride: 103 mmol/L (ref 98–111)
Creatinine: 1.01 mg/dL (ref 0.61–1.24)
GFR, Est AFR Am: >60 mL/min (ref >60)
GFR, Estimated: >60 mL/min (ref >60)
Glucose, Bld: 94 mg/dL (ref 70–99)
Potassium: 4.4 mmol/L (ref 3.5–5.1)
Sodium: 139 mmol/L (ref 135–145)
Total Bilirubin: 0.3 mg/dL (ref 0.3–1.2)
Total Protein: 7.9 g/dL (ref 6.5–8.1)

## 2019-01-30 MED ORDER — IOHEXOL 300 MG/ML  SOLN
75.0000 mL | Freq: Once | INTRAMUSCULAR | Status: AC | PRN
  Administered 2019-01-30: 09:00:00 75 mL via INTRAVENOUS

## 2019-01-30 MED ORDER — SODIUM CHLORIDE (PF) 0.9 % IJ SOLN
INTRAMUSCULAR | Status: AC
  Filled 2019-01-30: qty 50

## 2019-02-03 ENCOUNTER — Other Ambulatory Visit: Payer: Self-pay

## 2019-02-03 ENCOUNTER — Other Ambulatory Visit: Payer: Medicare Other

## 2019-02-03 ENCOUNTER — Encounter: Payer: Self-pay | Admitting: Internal Medicine

## 2019-02-03 ENCOUNTER — Inpatient Hospital Stay (HOSPITAL_BASED_OUTPATIENT_CLINIC_OR_DEPARTMENT_OTHER): Payer: Medicare Other | Admitting: Internal Medicine

## 2019-02-03 ENCOUNTER — Telehealth: Payer: Self-pay | Admitting: Internal Medicine

## 2019-02-03 VITALS — BP 113/75 | HR 58 | Temp 98.9°F | Resp 18 | Ht 73.0 in | Wt 135.0 lb

## 2019-02-03 DIAGNOSIS — Z923 Personal history of irradiation: Secondary | ICD-10-CM

## 2019-02-03 DIAGNOSIS — C3412 Malignant neoplasm of upper lobe, left bronchus or lung: Secondary | ICD-10-CM

## 2019-02-03 DIAGNOSIS — R634 Abnormal weight loss: Secondary | ICD-10-CM | POA: Diagnosis not present

## 2019-02-03 DIAGNOSIS — F17210 Nicotine dependence, cigarettes, uncomplicated: Secondary | ICD-10-CM | POA: Diagnosis not present

## 2019-02-03 DIAGNOSIS — Z9221 Personal history of antineoplastic chemotherapy: Secondary | ICD-10-CM | POA: Diagnosis not present

## 2019-02-03 DIAGNOSIS — Z79899 Other long term (current) drug therapy: Secondary | ICD-10-CM | POA: Diagnosis not present

## 2019-02-03 DIAGNOSIS — E440 Moderate protein-calorie malnutrition: Secondary | ICD-10-CM

## 2019-02-03 DIAGNOSIS — C3490 Malignant neoplasm of unspecified part of unspecified bronchus or lung: Secondary | ICD-10-CM

## 2019-02-03 NOTE — Telephone Encounter (Signed)
Scheduled appt per 7/21 LOS - mailed reminder letter with appt date and time. Central radiology to contact pt app.

## 2019-02-03 NOTE — Progress Notes (Signed)
[place] [phone]   Fax:([phone]) [phone]  OFFICE PROGRESS NOTE  Patient, No Pcp Per No address on file  DIAGNOSIS: Stage IIIA (T2a, N2, M0) non-small cell lung cancer, squamous cell carcinoma diagnosed in February 2017 and presented with left upper lobe obstructing mass as well as questionable periaortic lymphadenopathy.  PRIOR THERAPY:  1) Concurrent chemoradiation with weekly carboplatin for AUC of 2 and paclitaxel 45 MG/M2. First treatment was given on 10/10/2015. Status post 5 weeks of treatment. Last dose was given 11/14/2015 with partial response. 2) Consolidation systemic chemotherapy with carboplatin for AUC of 5 and paclitaxel 175 MG/M2 every 3 weeks with Neulasta support. First dose 01/25/2016. Status post 3 cycles. 3) status post curative radiotherapy to recurrent left lower lobe lung cancer under the care of Dr. Sondra Come completed on August 20, 2018  CURRENT THERAPY: Observation.  INTERVAL HISTORY: Maurice Mcknight 58 y.o. male returns to the clinic today for follow-up visit.  The patient is feeling fine today with no concerning complaints.  He denied having any chest pain, shortness of breath, cough or hemoptysis.  He denied having any fever or chills.  He lost around 10 pounds since his last visit.  Unfortunately he continues to involve with drug abuse and smoking.  He has no headache or visual changes.  He had repeat CT scan of the chest performed recently and he is here for evaluation and discussion of his scan results.  MEDICAL HISTORY: Past Medical History:  Diagnosis Date  . Encounter for antineoplastic chemotherapy 10/17/2015  . Fever and chills 04/11/2016  . lung ca dx'd 09/2015  . Malnutrition ([place]) 09/28/2015  . Needs smoking cessation education 09/28/2015  . Radiation 10/17/15-11/25/15   left chest 60 Gy    ALLERGIES:  has No Known Allergies.  MEDICATIONS:  Current Outpatient Medications  Medication Sig Dispense Refill  .  acetaminophen (TYLENOL) 325 MG tablet Take 650 mg by mouth every 6 (six) hours as needed.    Marland Kitchen ibuprofen (ADVIL,MOTRIN) 200 MG tablet Take 800 mg by mouth every 6 (six) hours as needed for moderate pain. Reported on 01/05/2016    . naproxen sodium (ANAPROX) 220 MG tablet Take 220-440 mg by mouth every 12 (twelve) hours as needed (for pain).    . ranitidine (ZANTAC) 150 MG capsule Take 1 capsule (150 mg total) by mouth daily. 30 capsule 0   No current facility-administered medications for this visit.    Facility-Administered Medications Ordered in Other Visits  Medication Dose Route Frequency Provider Last Rate Last Dose  . diphenhydrAMINE (BENADRYL) injection 50 mg  50 mg Intravenous Once Curt Bears, MD        SURGICAL HISTORY:  Past Surgical History:  Procedure Laterality Date  . VIDEO BRONCHOSCOPY Bilateral 09/09/2015   Diagnosis    REVIEW OF SYSTEMS:  A comprehensive review of systems was negative except for: Constitutional: positive for fatigue and weight loss   PHYSICAL EXAMINATION: General appearance: alert, cooperative, fatigued and no distress Head: Normocephalic, without obvious abnormality, atraumatic Neck: no adenopathy, no JVD, supple, symmetrical, trachea midline and thyroid not enlarged, symmetric, no tenderness/mass/nodules Lymph nodes: Cervical, supraclavicular, and axillary nodes normal. Resp: clear to auscultation bilaterally Back: symmetric, no curvature. ROM normal. No CVA tenderness. Cardio: regular rate and rhythm, S1, S2 normal, no murmur, click, rub or gallop GI: soft, non-tender; bowel sounds normal; no masses,  no organomegaly Extremities: extremities normal, atraumatic, no cyanosis or edema  ECOG PERFORMANCE STATUS: 1 - Symptomatic but  completely ambulatory  Blood pressure 113/75, pulse (!) 58, temperature 98.9 F (37.2 C), temperature source Oral, resp. rate 18, height 6\' 1"  (1.854 m), weight 135 lb (61.2 kg), SpO2 100 %.  LABORATORY DATA: Lab  Results  Component Value Date   WBC 2.6 (L) 01/30/2019   HGB 15.2 01/30/2019   HCT 45.5 01/30/2019   MCV 93.8 01/30/2019   PLT 183 01/30/2019      Chemistry      Component Value Date/Time   NA 139 01/30/2019 0729   NA 141 01/21/2017 0753   K 4.4 01/30/2019 0729   K 4.2 01/21/2017 0753   CL 103 01/30/2019 0729   CO2 28 01/30/2019 0729   CO2 26 01/21/2017 0753   BUN 8 01/30/2019 0729   BUN 10.1 01/21/2017 0753   CREATININE 1.01 01/30/2019 0729   CREATININE 1.0 01/21/2017 0753      Component Value Date/Time   CALCIUM 8.5 (L) 01/30/2019 0729   CALCIUM 8.8 01/21/2017 0753   ALKPHOS 94 01/30/2019 0729   ALKPHOS 130 01/21/2017 0753   AST 20 01/30/2019 0729   AST 19 01/21/2017 0753   ALT 10 01/30/2019 0729   ALT 11 01/21/2017 0753   BILITOT 0.3 01/30/2019 0729   BILITOT 0.24 01/21/2017 0753       RADIOGRAPHIC STUDIES: Ct Chest W Contrast  Result Date: 01/30/2019 CLINICAL DATA:  Restaging left lung cancer. Status post chemotherapy and XRT. EXAM: CT CHEST WITH CONTRAST TECHNIQUE: Multidetector CT imaging of the chest was performed during intravenous contrast administration. CONTRAST:  51mL OMNIPAQUE IOHEXOL 300 MG/ML  SOLN COMPARISON:  09/24/2018 FINDINGS: Cardiovascular: Normal heart size. No pericardial effusion. 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 Mediastinum/Nodes: Normal appearance of the thyroid gland. The trachea appears patent and is midline. Normal appearance of the esophagus. No enlarged supraclavicular or axillary lymph nodes. No mediastinal or hilar adenopathy. Lungs/Pleura: No pleural effusion. Volume loss and paramediastinal fibrosis and masslike architectural distortion is identified compatible with changes secondary to external beam radiation. This appears similar to the previous examination from 09/24/2018. Index cavitary focus in the region of the primary neoplasm appears slightly decreased measuring 1.6 x 1.2 cm, image 76/5.  Previously this was measured at 1.8 x 1.3 cm. No new pulmonary nodule or mass identified. Upper Abdomen: No acute abnormality. Musculoskeletal: No chest wall abnormality. No acute or significant osseous findings. IMPRESSION: 1. Stable appearance of paramediastinal radiation change within the left lung without convincing evidence to suggest recurrent tumor or metastatic disease. Cavitary area within the posterior aspect of the area of radiation change, in the location of the primary neoplasm is slightly decreased from previous study. Electronically Signed   By: Kerby Moors M.D.   On: 01/30/2019 12:05    ASSESSMENT AND PLAN:  This is a very pleasant 58 years old African-American male with recurrent non-small cell lung cancer initially diagnosed as a stage IIIA non-small cell lung cancer, squamous cell carcinoma presented with large obstructing left upper lobe lung mass in addition to mediastinal lymphadenopathy diagnosed in February 2017 status post concurrent chemoradiation followed by consolidation chemotherapy. The patient has been in observation since 2017.  He had recurrent disease with new left lower lobe lung nodule in December 2019. The patient underwent curative SBRT to this nodule under the care of Dr. Sondra Come and he tolerated it fairly well. The patient is currently on observation and he is feeling fine. He had repeat CT scan of the chest performed recently.  I personally and  independently reviewed the scans and discussed the results with the patient today. His scan showed no concerning findings for disease progression. I recommended for the patient to continue on observation with repeat CT scan of the chest in 6 months. The patient was advised to call immediately if he has any concerning symptoms in the interval. The patient voices understanding of current disease status and treatment options and is in agreement with the current care plan. All questions were answered. The patient knows to  call the clinic with any problems, questions or concerns. We can certainly see the patient much sooner if necessary.  Disclaimer: This note was dictated with voice recognition software. Similar sounding words can inadvertently be transcribed and may not be corrected upon review.

## 2019-08-06 ENCOUNTER — Inpatient Hospital Stay: Payer: Medicare Other | Attending: Internal Medicine

## 2019-08-06 ENCOUNTER — Other Ambulatory Visit: Payer: Self-pay

## 2019-08-06 ENCOUNTER — Ambulatory Visit (HOSPITAL_COMMUNITY)
Admission: RE | Admit: 2019-08-06 | Discharge: 2019-08-06 | Disposition: A | Discharge status: Home / Self Care | Payer: Medicare Other | Source: Ambulatory Visit | Attending: Internal Medicine | Admitting: Internal Medicine

## 2019-08-06 ENCOUNTER — Encounter (HOSPITAL_COMMUNITY): Payer: Self-pay

## 2019-08-06 DIAGNOSIS — R42 Dizziness and giddiness: Secondary | ICD-10-CM | POA: Diagnosis not present

## 2019-08-06 DIAGNOSIS — C3490 Malignant neoplasm of unspecified part of unspecified bronchus or lung: Secondary | ICD-10-CM

## 2019-08-06 DIAGNOSIS — C3412 Malignant neoplasm of upper lobe, left bronchus or lung: Secondary | ICD-10-CM | POA: Diagnosis not present

## 2019-08-06 DIAGNOSIS — Z79899 Other long term (current) drug therapy: Secondary | ICD-10-CM | POA: Diagnosis not present

## 2019-08-06 DIAGNOSIS — Z923 Personal history of irradiation: Secondary | ICD-10-CM | POA: Diagnosis not present

## 2019-08-06 DIAGNOSIS — R599 Enlarged lymph nodes, unspecified: Secondary | ICD-10-CM | POA: Insufficient documentation

## 2019-08-06 DIAGNOSIS — F17210 Nicotine dependence, cigarettes, uncomplicated: Secondary | ICD-10-CM | POA: Insufficient documentation

## 2019-08-06 LAB — CBC WITH DIFFERENTIAL (CANCER CENTER ONLY)
Abs Immature Granulocytes: 0 10*3/uL (ref 0.00–0.07)
Basophils Absolute: 0 10*3/uL (ref 0.0–0.1)
Basophils Relative: 2 %
Eosinophils Absolute: 0.2 10*3/uL (ref 0.0–0.5)
Eosinophils Relative: 8 %
HCT: 44.5 % (ref 39.0–52.0)
Hemoglobin: 14.7 g/dL (ref 13.0–17.0)
Immature Granulocytes: 0 %
Lymphocytes Relative: 39 %
Lymphs Abs: 1.1 10*3/uL (ref 0.7–4.0)
MCH: 31.5 pg (ref 26.0–34.0)
MCHC: 33 g/dL (ref 30.0–36.0)
MCV: 95.3 fL (ref 80.0–100.0)
Monocytes Absolute: 0.3 10*3/uL (ref 0.1–1.0)
Monocytes Relative: 11 %
Neutro Abs: 1.1 10*3/uL — ABNORMAL LOW (ref 1.7–7.7)
Neutrophils Relative %: 40 %
Platelet Count: 232 10*3/uL (ref 150–400)
RBC: 4.67 MIL/uL (ref 4.22–5.81)
RDW: 12.3 % (ref 11.5–15.5)
WBC Count: 2.7 10*3/uL — ABNORMAL LOW (ref 4.0–10.5)
nRBC: 0 % (ref 0.0–0.2)

## 2019-08-06 LAB — CMP (CANCER CENTER ONLY)
ALT: 10 U/L (ref 0–44)
AST: 23 U/L (ref 15–41)
Albumin: 3.7 g/dL (ref 3.5–5.0)
Alkaline Phosphatase: 80 U/L (ref 38–126)
Anion gap: 7 (ref 5–15)
BUN: 7 mg/dL (ref 6–20)
CO2: 28 mmol/L (ref 22–32)
Calcium: 7.9 mg/dL — ABNORMAL LOW (ref 8.9–10.3)
Chloride: 108 mmol/L (ref 98–111)
Creatinine: 0.92 mg/dL (ref 0.61–1.24)
GFR, Est AFR Am: >60 mL/min (ref >60)
GFR, Estimated: >60 mL/min (ref >60)
Glucose, Bld: 98 mg/dL (ref 70–99)
Potassium: 3.5 mmol/L (ref 3.5–5.1)
Sodium: 143 mmol/L (ref 135–145)
Total Bilirubin: <0.2 mg/dL — ABNORMAL LOW (ref 0.3–1.2)
Total Protein: 7.7 g/dL (ref 6.5–8.1)

## 2019-08-06 MED ORDER — SODIUM CHLORIDE (PF) 0.9 % IJ SOLN
INTRAMUSCULAR | Status: AC
  Filled 2019-08-06: qty 50

## 2019-08-06 MED ORDER — IOHEXOL 300 MG/ML  SOLN
75.0000 mL | Freq: Once | INTRAMUSCULAR | Status: AC | PRN
  Administered 2019-08-06: 10:00:00 75 mL via INTRAVENOUS

## 2019-08-10 ENCOUNTER — Inpatient Hospital Stay (HOSPITAL_BASED_OUTPATIENT_CLINIC_OR_DEPARTMENT_OTHER): Payer: Medicare Other | Admitting: Internal Medicine

## 2019-08-10 ENCOUNTER — Other Ambulatory Visit: Payer: Self-pay

## 2019-08-10 ENCOUNTER — Encounter: Payer: Self-pay | Admitting: Internal Medicine

## 2019-08-10 VITALS — BP 110/83 | HR 80 | Temp 98.3°F | Resp 20 | Wt 133.1 lb

## 2019-08-10 DIAGNOSIS — Z716 Tobacco abuse counseling: Secondary | ICD-10-CM

## 2019-08-10 DIAGNOSIS — C3490 Malignant neoplasm of unspecified part of unspecified bronchus or lung: Secondary | ICD-10-CM | POA: Diagnosis not present

## 2019-08-10 DIAGNOSIS — R42 Dizziness and giddiness: Secondary | ICD-10-CM | POA: Diagnosis not present

## 2019-08-10 DIAGNOSIS — C3412 Malignant neoplasm of upper lobe, left bronchus or lung: Secondary | ICD-10-CM

## 2019-08-10 DIAGNOSIS — R599 Enlarged lymph nodes, unspecified: Secondary | ICD-10-CM | POA: Diagnosis not present

## 2019-08-10 DIAGNOSIS — C3492 Malignant neoplasm of unspecified part of left bronchus or lung: Secondary | ICD-10-CM | POA: Diagnosis not present

## 2019-08-10 DIAGNOSIS — F17210 Nicotine dependence, cigarettes, uncomplicated: Secondary | ICD-10-CM | POA: Diagnosis not present

## 2019-08-10 DIAGNOSIS — Z79899 Other long term (current) drug therapy: Secondary | ICD-10-CM | POA: Diagnosis not present

## 2019-08-10 DIAGNOSIS — Z923 Personal history of irradiation: Secondary | ICD-10-CM | POA: Diagnosis not present

## 2019-08-10 NOTE — Progress Notes (Signed)
[place] [phone]   [phone]) [phone]  OFFICE PROGRESS NOTE  Patient, No Pcp Per No address on file  DIAGNOSIS: Stage IIIA (T2a, N2, M0) non-small cell lung cancer, squamous cell carcinoma diagnosed in February 2017 and presented with left upper lobe obstructing mass as well as questionable periaortic lymphadenopathy.  PRIOR THERAPY:  1) Concurrent chemoradiation with weekly carboplatin for AUC of 2 and paclitaxel 45 MG/M2. First treatment was given on 10/10/2015. Status post 5 weeks of treatment. Last dose was given 11/14/2015 with partial response. 2) Consolidation systemic chemotherapy with carboplatin for AUC of 5 and paclitaxel 175 MG/M2 every 3 weeks with Neulasta support. First dose 01/25/2016. Status post 3 cycles. 3) status post curative radiotherapy to recurrent left lower lobe lung cancer under the care of Dr. Sondra Come completed on August 20, 2018  CURRENT THERAPY: Observation.  INTERVAL HISTORY: Maurice Mcknight 59 y.o. male returns to the clinic today for follow-up visit.  The patient is feeling fine today with no concerning complaints except for occasional dizzy spells.  He denied having any current chest pain, shortness of breath, cough or hemoptysis.  He denied having any fever or chills.  He has no nausea, vomiting, diarrhea or constipation.  He denied having any headache or visual changes.  Unfortunately he continues to smoke and also he has current drug abuse issues.  The patient is here today for evaluation with repeat CT scan of the chest for restaging of his disease.  MEDICAL HISTORY: Past Medical History:  Diagnosis Date  . Encounter for antineoplastic chemotherapy 10/17/2015  . Fever and chills 04/11/2016  . lung ca dx'd 09/2015  . Malnutrition ([place]) 09/28/2015  . Needs smoking cessation education 09/28/2015  . Radiation 10/17/15-11/25/15   left chest 60 Gy    ALLERGIES:  has No Known Allergies.  MEDICATIONS:  Current Outpatient  Medications  Medication Sig Dispense Refill  . acetaminophen (TYLENOL) 325 MG tablet Take 650 mg by mouth every 6 (six) hours as needed.    Marland Kitchen ibuprofen (ADVIL,MOTRIN) 200 MG tablet Take 800 mg by mouth every 6 (six) hours as needed for moderate pain. Reported on 01/05/2016    . naproxen sodium (ANAPROX) 220 MG tablet Take 220-440 mg by mouth every 12 (twelve) hours as needed (for pain).    . ranitidine (ZANTAC) 150 MG capsule Take 1 capsule (150 mg total) by mouth daily. 30 capsule 0   No current facility-administered medications for this visit.   Facility-Administered Medications Ordered in Other Visits  Medication Dose Route Frequency Provider Last Rate Last Admin  . diphenhydrAMINE (BENADRYL) injection 50 mg  50 mg Intravenous Once Curt Bears, MD        SURGICAL HISTORY:  Past Surgical History:  Procedure Laterality Date  . VIDEO BRONCHOSCOPY Bilateral 09/09/2015   Diagnosis    REVIEW OF SYSTEMS:  A comprehensive review of systems was negative except for: Neurological: positive for dizziness   PHYSICAL EXAMINATION: General appearance: alert, cooperative, fatigued and no distress Head: Normocephalic, without obvious abnormality, atraumatic Neck: no adenopathy, no JVD, supple, symmetrical, trachea midline and thyroid not enlarged, symmetric, no tenderness/mass/nodules Lymph nodes: Cervical, supraclavicular, and axillary nodes normal. Resp: clear to auscultation bilaterally Back: symmetric, no curvature. ROM normal. No CVA tenderness. Cardio: regular rate and rhythm, S1, S2 normal, no murmur, click, rub or gallop GI: soft, non-tender; bowel sounds normal; no masses,  no organomegaly Extremities: extremities normal, atraumatic, no cyanosis or edema  ECOG PERFORMANCE STATUS: 1 -  Symptomatic but completely ambulatory  Blood pressure 110/83, pulse 80, temperature 98.3 F (36.8 C), temperature source Oral, resp. rate 20, weight 133 lb 0.8 oz (60.4 kg), SpO2 100 %.  LABORATORY  DATA: Lab Results  Component Value Date   WBC 2.7 (L) 08/06/2019   HGB 14.7 08/06/2019   HCT 44.5 08/06/2019   MCV 95.3 08/06/2019   PLT 232 08/06/2019      Chemistry      Component Value Date/Time   NA 143 08/06/2019 0834   NA 141 01/21/2017 0753   K 3.5 08/06/2019 0834   K 4.2 01/21/2017 0753   CL 108 08/06/2019 0834   CO2 28 08/06/2019 0834   CO2 26 01/21/2017 0753   BUN 7 08/06/2019 0834   BUN 10.1 01/21/2017 0753   CREATININE 0.92 08/06/2019 0834   CREATININE 1.0 01/21/2017 0753      Component Value Date/Time   CALCIUM 7.9 (L) 08/06/2019 0834   CALCIUM 8.8 01/21/2017 0753   ALKPHOS 80 08/06/2019 0834   ALKPHOS 130 01/21/2017 0753   AST 23 08/06/2019 0834   AST 19 01/21/2017 0753   ALT 10 08/06/2019 0834   ALT 11 01/21/2017 0753   BILITOT <0.2 (L) 08/06/2019 0834   BILITOT 0.24 01/21/2017 0753       RADIOGRAPHIC STUDIES: CT Chest W Contrast  Result Date: 08/06/2019 CLINICAL DATA:  Lung cancer. Restaging. EXAM: CT CHEST WITH CONTRAST TECHNIQUE: Multidetector CT imaging of the chest was performed during intravenous contrast administration. CONTRAST:  37mL OMNIPAQUE IOHEXOL 300 MG/ML  SOLN COMPARISON:  01/30/2019. FINDINGS: Cardiovascular: The heart size is normal. No substantial pericardial effusion. Coronary artery calcification is evident. Aberrant origin left subclavian artery noted, as before. Mediastinum/Nodes: No mediastinal lymphadenopathy. There is no hilar lymphadenopathy. There is no axillary lymphadenopathy. The esophagus has normal imaging features. Lungs/Pleura: Stable volume loss left hemithorax. Stable appearance left paramediastinal volume loss and scarring. No new suspicious nodule or mass. No pleural effusion. Upper Abdomen: Unremarkable. Musculoskeletal: No worrisome lytic or sclerotic osseous abnormality. IMPRESSION: 1. Stable exam. Volume loss with left paramediastinal scarring. No new or progressive interval findings. Electronically Signed   By: Misty Stanley M.D.   On: 08/06/2019 11:31    ASSESSMENT AND PLAN:  This is a very pleasant 59 years old African-American male with recurrent non-small cell lung cancer initially diagnosed as a stage IIIA non-small cell lung cancer, squamous cell carcinoma presented with large obstructing left upper lobe lung mass in addition to mediastinal lymphadenopathy diagnosed in February 2017 status post concurrent chemoradiation followed by consolidation chemotherapy. The patient has been in observation since 2017.  He had recurrent disease with new left lower lobe lung nodule in December 2019. The patient underwent curative SBRT to this nodule under the care of Dr. Sondra Come and he tolerated it fairly well. The patient is currently on observation and he is doing well with no concerning complaints except for occasional dizzy spells likely secondary to his drug abuse but if persisted will consider him for repeat MRI of the brain. He had repeat CT scan of the chest performed recently.  I personally and independently reviewed the scans and discussed the results with the patient today. His scan showed no concerning findings for disease progression or metastasis. I recommended for the patient to continue on observation with repeat CT scan of the chest in 6 months. The patient was advised to call immediately if he has any concerning symptoms in the interval. The patient voices understanding of current disease  status and treatment options and is in agreement with the current care plan. All questions were answered. The patient knows to call the clinic with any problems, questions or concerns. We can certainly see the patient much sooner if necessary.  Disclaimer: This note was dictated with voice recognition software. Similar sounding words can inadvertently be transcribed and may not be corrected upon review.

## 2019-08-10 NOTE — Patient Instructions (Signed)
Steps to Quit Smoking Smoking tobacco is the leading cause of preventable death. It can affect almost every organ in the body. Smoking puts you and people around you at risk for many serious, long-lasting (chronic) diseases. Quitting smoking can be hard, but it is one of the best things that you can do for your health. It is never too late to quit. How do I get ready to quit? When you decide to quit smoking, make a plan to help you succeed. Before you quit:  Pick a date to quit. Set a date within the next 2 weeks to give you time to prepare.  Write down the reasons why you are quitting. Keep this list in places where you will see it often.  Tell your family, friends, and co-workers that you are quitting. Their support is important.  Talk with your doctor about the choices that may help you quit.  Find out if your health insurance will pay for these treatments.  Know the people, places, things, and activities that make you want to smoke (triggers). Avoid them. What first steps can I take to quit smoking?  Throw away all cigarettes at home, at work, and in your car.  Throw away the things that you use when you smoke, such as ashtrays and lighters.  Clean your car. Make sure to empty the ashtray.  Clean your home, including curtains and carpets. What can I do to help me quit smoking? Talk with your doctor about taking medicines and seeing a counselor at the same time. You are more likely to succeed when you do both.  If you are pregnant or breastfeeding, talk with your doctor about counseling or other ways to quit smoking. Do not take medicine to help you quit smoking unless your doctor tells you to do so. To quit smoking: Quit right away  Quit smoking totally, instead of slowly cutting back on how much you smoke over a period of time.  Go to counseling. You are more likely to quit if you go to counseling sessions regularly. Take medicine You may take medicines to help you quit. Some  medicines need a prescription, and some you can buy over-the-counter. Some medicines may contain a drug called nicotine to replace the nicotine in cigarettes. Medicines may:  Help you to stop having the desire to smoke (cravings).  Help to stop the problems that come when you stop smoking (withdrawal symptoms). Your doctor may ask you to use:  Nicotine patches, gum, or lozenges.  Nicotine inhalers or sprays.  Non-nicotine medicine that is taken by mouth. Find resources Find resources and other ways to help you quit smoking and remain smoke-free after you quit. These resources are most helpful when you use them often. They include:  Online chats with a counselor.  Phone quitlines.  Printed self-help materials.  Support groups or group counseling.  Text messaging programs.  Mobile phone apps. Use apps on your mobile phone or tablet that can help you stick to your quit plan. There are many free apps for mobile phones and tablets as well as websites. Examples include Quit Guide from the CDC and smokefree.gov  What things can I do to make it easier to quit?   Talk to your family and friends. Ask them to support and encourage you.  Call a phone quitline (1-800-QUIT-NOW), reach out to support groups, or work with a counselor.  Ask people who smoke to not smoke around you.  Avoid places that make you want to smoke,   such as: ? Bars. ? Parties. ? Smoke-break areas at work.  Spend time with people who do not smoke.  Lower the stress in your life. Stress can make you want to smoke. Try these things to help your stress: ? Getting regular exercise. ? Doing deep-breathing exercises. ? Doing yoga. ? Meditating. ? Doing a body scan. To do this, close your eyes, focus on one area of your body at a time from head to toe. Notice which parts of your body are tense. Try to relax the muscles in those areas. How will I feel when I quit smoking? Day 1 to 3 weeks Within the first 24 hours,  you may start to have some problems that come from quitting tobacco. These problems are very bad 2-3 days after you quit, but they do not often last for more than 2-3 weeks. You may get these symptoms:  Mood swings.  Feeling restless, nervous, angry, or annoyed.  Trouble concentrating.  Dizziness.  Strong desire for high-sugar foods and nicotine.  Weight gain.  Trouble pooping (constipation).  Feeling like you may vomit (nausea).  Coughing or a sore throat.  Changes in how the medicines that you take for other issues work in your body.  Depression.  Trouble sleeping (insomnia). Week 3 and afterward After the first 2-3 weeks of quitting, you may start to notice more positive results, such as:  Better sense of smell and taste.  Less coughing and sore throat.  Slower heart rate.  Lower blood pressure.  Clearer skin.  Better breathing.  Fewer sick days. Quitting smoking can be hard. Do not give up if you fail the first time. Some people need to try a few times before they succeed. Do your best to stick to your quit plan, and talk with your doctor if you have any questions or concerns. Summary  Smoking tobacco is the leading cause of preventable death. Quitting smoking can be hard, but it is one of the best things that you can do for your health.  When you decide to quit smoking, make a plan to help you succeed.  Quit smoking right away, not slowly over a period of time.  When you start quitting, seek help from your doctor, family, or friends. This information is not intended to replace advice given to you by your health care provider. Make sure you discuss any questions you have with your health care provider. Document Revised: 03/27/2019 Document Reviewed: 09/20/2018 Elsevier Patient Education  2020 Elsevier Inc.  

## 2019-08-12 ENCOUNTER — Telehealth: Payer: Self-pay | Admitting: Internal Medicine

## 2019-08-12 NOTE — Telephone Encounter (Signed)
Scheduled per los. Called and left msg. Mailed printout  °

## 2019-09-30 ENCOUNTER — Other Ambulatory Visit: Payer: Self-pay

## 2020-02-05 ENCOUNTER — Inpatient Hospital Stay: Payer: Medicare Other | Attending: Internal Medicine

## 2020-02-05 ENCOUNTER — Other Ambulatory Visit: Payer: Medicare Other

## 2020-02-05 ENCOUNTER — Ambulatory Visit (HOSPITAL_COMMUNITY)
Admission: RE | Admit: 2020-02-05 | Discharge: 2020-02-05 | Disposition: A | Discharge status: Home / Self Care | Payer: Medicare Other | Source: Ambulatory Visit | Attending: Internal Medicine | Admitting: Internal Medicine

## 2020-02-05 ENCOUNTER — Other Ambulatory Visit: Payer: Self-pay

## 2020-02-05 DIAGNOSIS — C3490 Malignant neoplasm of unspecified part of unspecified bronchus or lung: Secondary | ICD-10-CM | POA: Insufficient documentation

## 2020-02-05 DIAGNOSIS — Z9221 Personal history of antineoplastic chemotherapy: Secondary | ICD-10-CM | POA: Insufficient documentation

## 2020-02-05 DIAGNOSIS — Z923 Personal history of irradiation: Secondary | ICD-10-CM | POA: Diagnosis not present

## 2020-02-05 DIAGNOSIS — I700 Atherosclerosis of aorta: Secondary | ICD-10-CM | POA: Insufficient documentation

## 2020-02-05 DIAGNOSIS — I2510 Atherosclerotic heart disease of native coronary artery without angina pectoris: Secondary | ICD-10-CM | POA: Diagnosis not present

## 2020-02-05 DIAGNOSIS — Q2547 Right aortic arch: Secondary | ICD-10-CM | POA: Diagnosis not present

## 2020-02-05 DIAGNOSIS — C3432 Malignant neoplasm of lower lobe, left bronchus or lung: Secondary | ICD-10-CM | POA: Diagnosis not present

## 2020-02-05 DIAGNOSIS — E46 Unspecified protein-calorie malnutrition: Secondary | ICD-10-CM | POA: Insufficient documentation

## 2020-02-05 DIAGNOSIS — J439 Emphysema, unspecified: Secondary | ICD-10-CM | POA: Insufficient documentation

## 2020-02-05 DIAGNOSIS — J181 Lobar pneumonia, unspecified organism: Secondary | ICD-10-CM | POA: Diagnosis not present

## 2020-02-05 LAB — CBC WITH DIFFERENTIAL (CANCER CENTER ONLY)
Abs Immature Granulocytes: 0 10*3/uL (ref 0.00–0.07)
Basophils Absolute: 0 10*3/uL (ref 0.0–0.1)
Basophils Relative: 1 %
Eosinophils Absolute: 0.2 10*3/uL (ref 0.0–0.5)
Eosinophils Relative: 7 %
HCT: 41.4 % (ref 39.0–52.0)
Hemoglobin: 14 g/dL (ref 13.0–17.0)
Immature Granulocytes: 0 %
Lymphocytes Relative: 38 %
Lymphs Abs: 1.1 10*3/uL (ref 0.7–4.0)
MCH: 32.3 pg (ref 26.0–34.0)
MCHC: 33.8 g/dL (ref 30.0–36.0)
MCV: 95.6 fL (ref 80.0–100.0)
Monocytes Absolute: 0.3 10*3/uL (ref 0.1–1.0)
Monocytes Relative: 12 %
Neutro Abs: 1.2 10*3/uL — ABNORMAL LOW (ref 1.7–7.7)
Neutrophils Relative %: 42 %
Platelet Count: 201 10*3/uL (ref 150–400)
RBC: 4.33 MIL/uL (ref 4.22–5.81)
RDW: 11.9 % (ref 11.5–15.5)
WBC Count: 2.9 10*3/uL — ABNORMAL LOW (ref 4.0–10.5)
nRBC: 0 % (ref 0.0–0.2)

## 2020-02-05 LAB — CMP (CANCER CENTER ONLY)
ALT: 11 U/L (ref 0–44)
AST: 23 U/L (ref 15–41)
Albumin: 3.5 g/dL (ref 3.5–5.0)
Alkaline Phosphatase: 84 U/L (ref 38–126)
Anion gap: 11 (ref 5–15)
BUN: 11 mg/dL (ref 6–20)
CO2: 23 mmol/L (ref 22–32)
Calcium: 8.9 mg/dL (ref 8.9–10.3)
Chloride: 107 mmol/L (ref 98–111)
Creatinine: 0.92 mg/dL (ref 0.61–1.24)
GFR, Est AFR Am: >60 mL/min (ref >60)
GFR, Estimated: >60 mL/min (ref >60)
Glucose, Bld: 76 mg/dL (ref 70–99)
Potassium: 3.9 mmol/L (ref 3.5–5.1)
Sodium: 141 mmol/L (ref 135–145)
Total Bilirubin: <0.2 mg/dL — ABNORMAL LOW (ref 0.3–1.2)
Total Protein: 7.6 g/dL (ref 6.5–8.1)

## 2020-02-05 MED ORDER — IOHEXOL 300 MG/ML  SOLN
75.0000 mL | Freq: Once | INTRAMUSCULAR | Status: AC | PRN
  Administered 2020-02-05: 75 mL via INTRAVENOUS

## 2020-02-08 ENCOUNTER — Ambulatory Visit: Payer: Medicare Other | Admitting: Internal Medicine

## 2020-02-08 ENCOUNTER — Encounter: Payer: Self-pay | Admitting: Internal Medicine

## 2020-02-08 ENCOUNTER — Inpatient Hospital Stay (HOSPITAL_BASED_OUTPATIENT_CLINIC_OR_DEPARTMENT_OTHER): Payer: Medicare Other | Admitting: Internal Medicine

## 2020-02-08 ENCOUNTER — Other Ambulatory Visit: Payer: Self-pay

## 2020-02-08 VITALS — BP 120/69 | HR 60 | Temp 97.9°F | Resp 18 | Ht 73.0 in | Wt 133.1 lb

## 2020-02-08 DIAGNOSIS — C3490 Malignant neoplasm of unspecified part of unspecified bronchus or lung: Secondary | ICD-10-CM | POA: Diagnosis not present

## 2020-02-08 DIAGNOSIS — C3432 Malignant neoplasm of lower lobe, left bronchus or lung: Secondary | ICD-10-CM | POA: Diagnosis not present

## 2020-02-08 DIAGNOSIS — J439 Emphysema, unspecified: Secondary | ICD-10-CM | POA: Diagnosis not present

## 2020-02-08 DIAGNOSIS — I2510 Atherosclerotic heart disease of native coronary artery without angina pectoris: Secondary | ICD-10-CM | POA: Diagnosis not present

## 2020-02-08 DIAGNOSIS — I700 Atherosclerosis of aorta: Secondary | ICD-10-CM | POA: Diagnosis not present

## 2020-02-08 DIAGNOSIS — E46 Unspecified protein-calorie malnutrition: Secondary | ICD-10-CM | POA: Diagnosis not present

## 2020-02-08 DIAGNOSIS — Z923 Personal history of irradiation: Secondary | ICD-10-CM | POA: Diagnosis not present

## 2020-02-08 DIAGNOSIS — C3412 Malignant neoplasm of upper lobe, left bronchus or lung: Secondary | ICD-10-CM

## 2020-02-08 NOTE — Progress Notes (Signed)
[place] [facility] Telephone:([phone]) [phone]   Fax:(336) [phone]  OFFICE PROGRESS NOTE  Default, Provider, MD No address on file  DIAGNOSIS: Stage IIIA (T2a, N2, M0) non-small cell lung cancer, squamous cell carcinoma diagnosed in February 2017 and presented with left upper lobe obstructing mass as well as questionable periaortic lymphadenopathy.  PRIOR THERAPY:  1) Concurrent chemoradiation with weekly carboplatin for AUC of 2 and paclitaxel 45 MG/M2. First treatment was given on 10/10/2015. Status post 5 weeks of treatment. Last dose was given 11/14/2015 with partial response. 2) Consolidation systemic chemotherapy with carboplatin for AUC of 5 and paclitaxel 175 MG/M2 every 3 weeks with Neulasta support. First dose 01/25/2016. Status post 3 cycles. 3) status post curative radiotherapy to recurrent left lower lobe lung cancer under the care of Dr. Sondra Come completed on August 20, 2018  CURRENT THERAPY: Observation.  INTERVAL HISTORY: Maurice Mcknight 59 y.o. male returns to the clinic today for 6 months follow-up visit.  The patient is feeling fine today with no concerning complaints.  He denied having any chest pain, shortness of breath, cough or hemoptysis.  He denied having any fever or chills.  He has no nausea, vomiting, diarrhea or constipation.  He has no headache or visual changes.  The patient had repeat CT scan of the chest performed recently and he is here for evaluation and discussion of his scan results.   MEDICAL HISTORY: Past Medical History:  Diagnosis Date  . Encounter for antineoplastic chemotherapy 10/17/2015  . Fever and chills 04/11/2016  . lung ca dx'd 09/2015  . Malnutrition ([place]) 09/28/2015  . Needs smoking cessation education 09/28/2015  . Radiation 10/17/15-11/25/15   left chest 60 Gy    ALLERGIES:  has No Known Allergies.  MEDICATIONS:  Current Outpatient Medications  Medication Sig Dispense Refill  . acetaminophen (TYLENOL) 325 MG tablet Take 650  mg by mouth every 6 (six) hours as needed.    Marland Kitchen ibuprofen (ADVIL,MOTRIN) 200 MG tablet Take 800 mg by mouth every 6 (six) hours as needed for moderate pain. Reported on 01/05/2016    . naproxen sodium (ANAPROX) 220 MG tablet Take 220-440 mg by mouth every 12 (twelve) hours as needed (for pain).    . ranitidine (ZANTAC) 150 MG capsule Take 1 capsule (150 mg total) by mouth daily. 30 capsule 0   No current facility-administered medications for this visit.    SURGICAL HISTORY:  Past Surgical History:  Procedure Laterality Date  . VIDEO BRONCHOSCOPY Bilateral 09/09/2015   Diagnosis    REVIEW OF SYSTEMS:  A comprehensive review of systems was negative.   PHYSICAL EXAMINATION: General appearance: alert, cooperative and no distress Head: Normocephalic, without obvious abnormality, atraumatic Neck: no adenopathy, no JVD, supple, symmetrical, trachea midline and thyroid not enlarged, symmetric, no tenderness/mass/nodules Lymph nodes: Cervical, supraclavicular, and axillary nodes normal. Resp: clear to auscultation bilaterally Back: symmetric, no curvature. ROM normal. No CVA tenderness. Cardio: regular rate and rhythm, S1, S2 normal, no murmur, click, rub or gallop GI: soft, non-tender; bowel sounds normal; no masses,  no organomegaly Extremities: extremities normal, atraumatic, no cyanosis or edema  ECOG PERFORMANCE STATUS: 1 - Symptomatic but completely ambulatory  Blood pressure 120/69, pulse 60, temperature 97.9 F (36.6 C), temperature source Temporal, resp. rate 18, height 6\' 1"  (1.854 m), weight 133 lb 1.6 oz (60.4 kg), SpO2 100 %.  LABORATORY DATA: Lab Results  Component Value Date   WBC 2.9 (L) 02/05/2020   HGB 14.0 02/05/2020   HCT  41.4 02/05/2020   MCV 95.6 02/05/2020   PLT 201 02/05/2020      Chemistry      Component Value Date/Time   NA 141 02/05/2020 0718   NA 141 01/21/2017 0753   K 3.9 02/05/2020 0718   K 4.2 01/21/2017 0753   CL 107 02/05/2020 0718   CO2 23  02/05/2020 0718   CO2 26 01/21/2017 0753   BUN 11 02/05/2020 0718   BUN 10.1 01/21/2017 0753   CREATININE 0.92 02/05/2020 0718   CREATININE 1.0 01/21/2017 0753      Component Value Date/Time   CALCIUM 8.9 02/05/2020 0718   CALCIUM 8.8 01/21/2017 0753   ALKPHOS 84 02/05/2020 0718   ALKPHOS 130 01/21/2017 0753   AST 23 02/05/2020 0718   AST 19 01/21/2017 0753   ALT 11 02/05/2020 0718   ALT 11 01/21/2017 0753   BILITOT <0.2 (L) 02/05/2020 0718   BILITOT 0.24 01/21/2017 0753       RADIOGRAPHIC STUDIES: CT Chest W Contrast  Result Date: 02/05/2020 CLINICAL DATA:  Primary Cancer Type: Lung Imaging Indication: Routine surveillance Interval therapy since last imaging? No Initial Cancer Diagnosis Date: 09/09/2015; Established by: Biopsy-proven Detailed Pathology: Stage IIIA non-small cell lung cancer, squamous cell carcinoma. Primary Tumor location: Left hilum 2017.  Left lower lobe 2019. Recurrence? Yes; Date(s) of recurrence: 06/23/2018; Established by: Imaging only Surgeries: No thoracic. Chemotherapy: Yes; Ongoing? No; Most recent administration: 03/07/2016 Immunotherapy?  Yes; Type: Neulasta; Ongoing? No Radiation therapy? Yes Date Range: 08/07/2018-08/20/2018; Target: Left chest Date Range: 10/17/2015-11/25/2015; Target: Left chest EXAM: CT CHEST WITH CONTRAST TECHNIQUE: Multidetector CT imaging of the chest was performed during intravenous contrast administration. CONTRAST:  41mL OMNIPAQUE IOHEXOL 300 MG/ML  SOLN COMPARISON:  Most recent CT chest 08/06/2019.  06/23/2018 PET-CT. 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 Aortic atherosclerosis. No central pulmonary embolism, on this non-dedicated study. Normal heart size, without pericardial effusion. Lad coronary artery calcification. Mediastinum/Nodes: No supraclavicular adenopathy. No mediastinal or hilar adenopathy. The esophagus is mildly dilated with fluid  level within, including on 49/2. Lungs/Pleura: Left-sided pleural thickening superiorly is similar, mild. Moderate centrilobular emphysema. Similar appearance of medial left upper and lower lobe consolidation with volume loss and architectural distortion, consistent with prior radiation. No evidence of locally recurrent or residual disease. Upper Abdomen: Normal imaged portions of the liver, spleen, stomach, pancreas, gallbladder, adrenal glands, kidneys. Musculoskeletal: No acute osseous abnormality. IMPRESSION: 1. Similar appearance of radiation induced consolidation within the medial left upper and lower lobes. No evidence of locally recurrent or residual disease. 2. No thoracic adenopathy. 3. Esophageal air fluid level suggests dysmotility or gastroesophageal reflux. 4. Aortic Atherosclerosis (ICD10-I70.0) and Emphysema (ICD10-J43.9). Coronary artery atherosclerosis. 5. Right aortic arch. Electronically Signed   By: Abigail Miyamoto M.D.   On: 02/05/2020 10:18    ASSESSMENT AND PLAN:  This is a very pleasant 59 years old African-American male with recurrent non-small cell lung cancer initially diagnosed as a stage IIIA non-small cell lung cancer, squamous cell carcinoma presented with large obstructing left upper lobe lung mass in addition to mediastinal lymphadenopathy diagnosed in February 2017 status post concurrent chemoradiation followed by consolidation chemotherapy. The patient has been in observation since 2017.  He had recurrent disease with new left lower lobe lung nodule in December 2019. The patient underwent curative SBRT to this nodule under the care of Dr. Sondra Come and he tolerated it fairly well. The patient has been in observation since that time  and he is feeling fine today with no concerning complaints. He had repeat CT scan of the chest performed recently.  I personally and independently reviewed the scans and discussed the results with the patient today. His scan showed no concerning  findings for disease recurrence or metastasis. I recommended for him to continue on observation with repeat CT scan of the chest in 6 months. The patient was advised to call immediately if he has any concerning symptoms in the interval. The patient voices understanding of current disease status and treatment options and is in agreement with the current care plan. All questions were answered. The patient knows to call the clinic with any problems, questions or concerns. We can certainly see the patient much sooner if necessary.  Disclaimer: This note was dictated with voice recognition software. Similar sounding words can inadvertently be transcribed and may not be corrected upon review.

## 2020-02-09 ENCOUNTER — Telehealth: Payer: Self-pay | Admitting: Internal Medicine

## 2020-02-09 NOTE — Telephone Encounter (Signed)
Scheduled appt per 7/26 los- mailed reminder letter with appt date and time

## 2020-06-15 ENCOUNTER — Telehealth: Payer: Self-pay | Admitting: Medical Oncology

## 2020-06-15 NOTE — Telephone Encounter (Signed)
Bilateral pain shoulder blades-sharp and got dizzy. Denies injury. Next scan Jan - Please advise

## 2020-06-15 NOTE — Telephone Encounter (Signed)
We can try to see him sooner with repeat scan.  Thank you.

## 2020-06-16 ENCOUNTER — Telehealth: Payer: Self-pay

## 2020-06-16 NOTE — Telephone Encounter (Signed)
Patient scheduled for CT scan and scheduling message sent to schedule patient for a lab appointment one hour prior to CT scan. Scheduling message also sent to move follow up with [name] to soon after CT scan. Patient is aware of CT appointment.

## 2020-06-17 ENCOUNTER — Telehealth: Payer: Self-pay | Admitting: Internal Medicine

## 2020-06-17 NOTE — Telephone Encounter (Signed)
Scheduled apt per 12/2 [facility] msg - called pt - no answer and no vmail. Mailed letter with appt date and time

## 2020-06-29 ENCOUNTER — Other Ambulatory Visit: Payer: Self-pay | Admitting: Medical Oncology

## 2020-06-29 DIAGNOSIS — C3490 Malignant neoplasm of unspecified part of unspecified bronchus or lung: Secondary | ICD-10-CM

## 2020-06-30 ENCOUNTER — Ambulatory Visit (HOSPITAL_COMMUNITY)
Admission: RE | Admit: 2020-06-30 | Discharge: 2020-06-30 | Disposition: A | Discharge status: Home / Self Care | Payer: Medicare Other | Source: Ambulatory Visit | Attending: Internal Medicine | Admitting: Internal Medicine

## 2020-06-30 ENCOUNTER — Inpatient Hospital Stay: Payer: Medicare Other | Attending: Internal Medicine

## 2020-06-30 ENCOUNTER — Other Ambulatory Visit: Payer: Self-pay

## 2020-06-30 ENCOUNTER — Encounter (HOSPITAL_COMMUNITY): Payer: Self-pay

## 2020-06-30 DIAGNOSIS — Z85118 Personal history of other malignant neoplasm of bronchus and lung: Secondary | ICD-10-CM | POA: Insufficient documentation

## 2020-06-30 DIAGNOSIS — C3490 Malignant neoplasm of unspecified part of unspecified bronchus or lung: Secondary | ICD-10-CM

## 2020-06-30 DIAGNOSIS — Z79899 Other long term (current) drug therapy: Secondary | ICD-10-CM | POA: Diagnosis not present

## 2020-06-30 DIAGNOSIS — J984 Other disorders of lung: Secondary | ICD-10-CM | POA: Diagnosis not present

## 2020-06-30 DIAGNOSIS — Z923 Personal history of irradiation: Secondary | ICD-10-CM | POA: Diagnosis not present

## 2020-06-30 DIAGNOSIS — E46 Unspecified protein-calorie malnutrition: Secondary | ICD-10-CM | POA: Insufficient documentation

## 2020-06-30 DIAGNOSIS — Z9221 Personal history of antineoplastic chemotherapy: Secondary | ICD-10-CM | POA: Insufficient documentation

## 2020-06-30 DIAGNOSIS — Q2547 Right aortic arch: Secondary | ICD-10-CM | POA: Diagnosis not present

## 2020-06-30 DIAGNOSIS — J181 Lobar pneumonia, unspecified organism: Secondary | ICD-10-CM | POA: Diagnosis not present

## 2020-06-30 LAB — CMP (CANCER CENTER ONLY)
ALT: 13 U/L (ref 0–44)
AST: 18 U/L (ref 15–41)
Albumin: 3 g/dL — ABNORMAL LOW (ref 3.5–5.0)
Alkaline Phosphatase: 78 U/L (ref 38–126)
Anion gap: 6 (ref 5–15)
BUN: 12 mg/dL (ref 8–23)
CO2: 29 mmol/L (ref 22–32)
Calcium: 9 mg/dL (ref 8.9–10.3)
Chloride: 101 mmol/L (ref 98–111)
Creatinine: 0.81 mg/dL (ref 0.61–1.24)
GFR, Estimated: >60 mL/min (ref >60)
Glucose, Bld: 91 mg/dL (ref 70–99)
Potassium: 4.2 mmol/L (ref 3.5–5.1)
Sodium: 136 mmol/L (ref 135–145)
Total Bilirubin: 0.5 mg/dL (ref 0.3–1.2)
Total Protein: 8.1 g/dL (ref 6.5–8.1)

## 2020-06-30 LAB — CBC WITH DIFFERENTIAL (CANCER CENTER ONLY)
Abs Immature Granulocytes: 0.02 10*3/uL (ref 0.00–0.07)
Basophils Absolute: 0 10*3/uL (ref 0.0–0.1)
Basophils Relative: 1 %
Eosinophils Absolute: 0 10*3/uL (ref 0.0–0.5)
Eosinophils Relative: 0 %
HCT: 38.1 % — ABNORMAL LOW (ref 39.0–52.0)
Hemoglobin: 12.4 g/dL — ABNORMAL LOW (ref 13.0–17.0)
Immature Granulocytes: 0 %
Lymphocytes Relative: 22 %
Lymphs Abs: 1.1 10*3/uL (ref 0.7–4.0)
MCH: 30.8 pg (ref 26.0–34.0)
MCHC: 32.5 g/dL (ref 30.0–36.0)
MCV: 94.5 fL (ref 80.0–100.0)
Monocytes Absolute: 0.6 10*3/uL (ref 0.1–1.0)
Monocytes Relative: 12 %
Neutro Abs: 3.3 10*3/uL (ref 1.7–7.7)
Neutrophils Relative %: 65 %
Platelet Count: 358 10*3/uL (ref 150–400)
RBC: 4.03 MIL/uL — ABNORMAL LOW (ref 4.22–5.81)
RDW: 11.9 % (ref 11.5–15.5)
WBC Count: 5.1 10*3/uL (ref 4.0–10.5)
nRBC: 0 % (ref 0.0–0.2)

## 2020-06-30 MED ORDER — SODIUM CHLORIDE (PF) 0.9 % IJ SOLN
INTRAMUSCULAR | Status: AC
  Filled 2020-06-30: qty 50

## 2020-06-30 MED ORDER — IOHEXOL 300 MG/ML  SOLN
75.0000 mL | Freq: Once | INTRAMUSCULAR | Status: AC | PRN
  Administered 2020-06-30: 75 mL via INTRAVENOUS

## 2020-07-04 ENCOUNTER — Encounter: Payer: Self-pay | Admitting: Internal Medicine

## 2020-07-04 ENCOUNTER — Inpatient Hospital Stay (HOSPITAL_BASED_OUTPATIENT_CLINIC_OR_DEPARTMENT_OTHER): Payer: Medicare Other | Admitting: Internal Medicine

## 2020-07-04 ENCOUNTER — Other Ambulatory Visit: Payer: Self-pay

## 2020-07-04 VITALS — BP 130/83 | HR 84 | Temp 98.6°F | Resp 18 | Ht 73.0 in | Wt 128.1 lb

## 2020-07-04 DIAGNOSIS — Z79899 Other long term (current) drug therapy: Secondary | ICD-10-CM | POA: Diagnosis not present

## 2020-07-04 DIAGNOSIS — E46 Unspecified protein-calorie malnutrition: Secondary | ICD-10-CM | POA: Diagnosis not present

## 2020-07-04 DIAGNOSIS — C3490 Malignant neoplasm of unspecified part of unspecified bronchus or lung: Secondary | ICD-10-CM

## 2020-07-04 DIAGNOSIS — C3412 Malignant neoplasm of upper lobe, left bronchus or lung: Secondary | ICD-10-CM

## 2020-07-04 DIAGNOSIS — Z923 Personal history of irradiation: Secondary | ICD-10-CM | POA: Diagnosis not present

## 2020-07-04 DIAGNOSIS — Z9221 Personal history of antineoplastic chemotherapy: Secondary | ICD-10-CM | POA: Diagnosis not present

## 2020-07-04 DIAGNOSIS — Z85118 Personal history of other malignant neoplasm of bronchus and lung: Secondary | ICD-10-CM | POA: Diagnosis not present

## 2020-07-04 NOTE — Progress Notes (Signed)
[place] Telephone:(336) [phone]   Fax:(336) [phone]  OFFICE PROGRESS NOTE  Default, Provider, MD No address on file  DIAGNOSIS: Stage IIIA (T2a, N2, M0) non-small cell lung cancer, squamous cell carcinoma diagnosed in February 2017 and presented with left upper lobe obstructing mass as well as questionable periaortic lymphadenopathy.  PRIOR THERAPY:  1) Concurrent chemoradiation with weekly carboplatin for AUC of 2 and paclitaxel 45 MG/M2. First treatment was given on 10/10/2015. Status post 5 weeks of treatment. Last dose was given 11/14/2015 with partial response. 2) Consolidation systemic chemotherapy with carboplatin for AUC of 5 and paclitaxel 175 MG/M2 every 3 weeks with Neulasta support. First dose 01/25/2016. Status post 3 cycles. 3) status post curative radiotherapy to recurrent left lower lobe lung cancer under the care of Dr. Sondra Come completed on August 20, 2018  CURRENT THERAPY: Observation.  INTERVAL HISTORY: Maurice Mcknight 59 y.o. male returns to the clinic today for follow-up visit.  The patient is feeling fine today with no concerning complaints.  He denied having any current chest pain, shortness of breath, cough or hemoptysis.  He denied having any fever or chills.  He has no nausea, vomiting, diarrhea or constipation.  He has no headache or visual changes.  He is currently on observation.  He had repeat CT scan of the chest performed recently and he is here for evaluation and discussion of his scan results.  MEDICAL HISTORY: Past Medical History:  Diagnosis Date  . Encounter for antineoplastic chemotherapy 10/17/2015  . Fever and chills 04/11/2016  . lung ca dx'd 09/2015  . Malnutrition ([place]) 09/28/2015  . Needs smoking cessation education 09/28/2015  . Radiation 10/17/15-11/25/15   left chest 60 Gy    ALLERGIES:  has No Known Allergies.  MEDICATIONS:  Current Outpatient Medications  Medication Sig Dispense Refill  . acetaminophen (TYLENOL)  325 MG tablet Take 650 mg by mouth every 6 (six) hours as needed.    Marland Kitchen ibuprofen (ADVIL,MOTRIN) 200 MG tablet Take 800 mg by mouth every 6 (six) hours as needed for moderate pain. Reported on 01/05/2016    . naproxen sodium (ANAPROX) 220 MG tablet Take 220-440 mg by mouth every 12 (twelve) hours as needed (for pain).    . ranitidine (ZANTAC) 150 MG capsule Take 1 capsule (150 mg total) by mouth daily. 30 capsule 0   No current facility-administered medications for this visit.    SURGICAL HISTORY:  Past Surgical History:  Procedure Laterality Date  . VIDEO BRONCHOSCOPY Bilateral 09/09/2015   Diagnosis    REVIEW OF SYSTEMS:  A comprehensive review of systems was negative except for: Constitutional: positive for fatigue   PHYSICAL EXAMINATION: General appearance: alert, cooperative and no distress Head: Normocephalic, without obvious abnormality, atraumatic Neck: no adenopathy, no JVD, supple, symmetrical, trachea midline and thyroid not enlarged, symmetric, no tenderness/mass/nodules Lymph nodes: Cervical, supraclavicular, and axillary nodes normal. Resp: clear to auscultation bilaterally Back: symmetric, no curvature. ROM normal. No CVA tenderness. Cardio: regular rate and rhythm, S1, S2 normal, no murmur, click, rub or gallop GI: soft, non-tender; bowel sounds normal; no masses,  no organomegaly Extremities: extremities normal, atraumatic, no cyanosis or edema  ECOG PERFORMANCE STATUS: 1 - Symptomatic but completely ambulatory  Blood pressure 130/83, pulse 84, temperature 98.6 F (37 C), temperature source Tympanic, resp. rate 18, height 6\' 1"  (1.854 m), weight 128 lb 1.6 oz (58.1 kg), SpO2 100 %.  LABORATORY DATA: Lab Results  Component Value Date   WBC 5.1 06/30/2020  HGB 12.4 (L) 06/30/2020   HCT 38.1 (L) 06/30/2020   MCV 94.5 06/30/2020   PLT 358 06/30/2020      Chemistry      Component Value Date/Time   NA 136 06/30/2020 0912   NA 141 01/21/2017 0753   K 4.2  06/30/2020 0912   K 4.2 01/21/2017 0753   CL 101 06/30/2020 0912   CO2 29 06/30/2020 0912   CO2 26 01/21/2017 0753   BUN 12 06/30/2020 0912   BUN 10.1 01/21/2017 0753   CREATININE 0.81 06/30/2020 0912   CREATININE 1.0 01/21/2017 0753      Component Value Date/Time   CALCIUM 9.0 06/30/2020 0912   CALCIUM 8.8 01/21/2017 0753   ALKPHOS 78 06/30/2020 0912   ALKPHOS 130 01/21/2017 0753   AST 18 06/30/2020 0912   AST 19 01/21/2017 0753   ALT 13 06/30/2020 0912   ALT 11 01/21/2017 0753   BILITOT 0.5 06/30/2020 0912   BILITOT 0.24 01/21/2017 0753       RADIOGRAPHIC STUDIES: CT Chest W Contrast  Result Date: 06/30/2020 CLINICAL DATA:  Non-small cell lung cancer stage initially diagnosed in March of 20146 in this 59 year old male reportedly with some pain between shoulder blades currently. EXAM: CT CHEST WITH CONTRAST TECHNIQUE: Multidetector CT imaging of the chest was performed during intravenous contrast administration. CONTRAST:  30mL OMNIPAQUE IOHEXOL 300 MG/ML  SOLN COMPARISON:  Cardiac structures: Heart size is normal. Shift of heart mediastinal structures in the LEFT chest secondary to extensive upper lobe volume loss and post treatment changes. 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 Stable 2.9 cm caliber of the descending thoracic aorta and 3 cm caliber of the ascending thoracic aorta. Distortion of pulmonary vasculature secondary to post treatment changes in the LEFT chest. 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 Aorta passing behind esophagus likely with some impression/mass effect upon the proximal esophagus. This is unchanged. No sign of adenopathy in the chest. Lungs/Pleura: Post treatment changes with perihilar consolidative process in volume loss extending into superior segment of LEFT lower lobe and along the LEFT upper lobe and LEFT mediastinal border. Area of greatest nodular consolidative change  measured 3.4 x 1.8 cm on image 66 of series 7 showing similar borders no change in enhancement measuring approximately 3.5 x 1.9 cm on the prior study. No pleural effusion. No new area of consolidation. Airways are patent. Upper Abdomen: Incidental imaging of upper abdominal contents without acute process. Mild variable enhancement of the liver likely related to phase of contrast enhancement Tiny area of hyperenhancement in the medial segment LEFT hepatic lobe (image 165, series 2) approximately 3 mm. Not seen on prior exams, multiple prior exams reviewed. Adrenal glands are normal. Musculoskeletal: No acute bone finding. No destructive bone process. IMPRESSION: Post treatment changes without signs of disease recurrence in the chest. Subtle area of hypervascularity measuring 3-4 mm in the medial segment of the LEFT hepatic lobe likely small benign shunt lesion or flash fill hemangioma. However given patient history and inability to locate this abnormality on prior imaging would suggest follow-up MRI in 3-6 months of the liver. Alternatively, dedicated abdominal imaging with CT could be performed at the time of follow-up chest imaging this is planned within that time frame. Patulous esophagus may be related to vascular anatomy (RIGHT sided aortic arch with aberrant LEFT subclavian) some mass effect upon the proximal esophagus though could also be related at least to some extent reflux but is similar to  the previous study. Electronically Signed   By: Zetta Bills M.D.   On: 06/30/2020 15:55    ASSESSMENT AND PLAN:  This is a very pleasant 59 years old African-American male with recurrent non-small cell lung cancer initially diagnosed as a stage IIIA non-small cell lung cancer, squamous cell carcinoma presented with large obstructing left upper lobe lung mass in addition to mediastinal lymphadenopathy diagnosed in February 2017 status post concurrent chemoradiation followed by consolidation chemotherapy. The  patient has been in observation since 2017.  He had recurrent disease with new left lower lobe lung nodule in December 2019. The patient underwent curative SBRT to this nodule under the care of Dr. Sondra Come and he tolerated it fairly well. The patient is currently on observation and he is feeling fine today with no concerning complaints. He had repeat CT scan of the chest performed recently.  I personally and independently reviewed the scans and discussed the results with the patient today. Has a scan showed no concerning findings for disease recurrence or metastasis but there was a supple area of hyper vascularity measuring 3-4 mm in the medial segment of the left hepatic lobe likely small benign shunt lesion or flash fill hemangioma.  It needs close monitoring on upcoming imaging studies. I recommended for the patient to have repeat CT scan of the chest, abdomen pelvis in 3 months for further evaluation of this lesion and also to rule out any other disease recurrence. I also strongly advised the patient to quit smoking. He was advised to call immediately if he has any concerning symptoms in the interval. The patient voices understanding of current disease status and treatment options and is in agreement with the current care plan. All questions were answered. The patient knows to call the clinic with any problems, questions or concerns. We can certainly see the patient much sooner if necessary.  Disclaimer: This note was dictated with voice recognition software. Similar sounding words can inadvertently be transcribed and may not be corrected upon review.

## 2020-07-06 ENCOUNTER — Telehealth: Payer: Self-pay | Admitting: Internal Medicine

## 2020-07-06 NOTE — Telephone Encounter (Signed)
Scheduled appts per 12/20 los. Unable to leave voicemail. Mailed appt reminder and calendar.

## 2020-08-08 ENCOUNTER — Inpatient Hospital Stay: Payer: Medicare Other | Attending: Internal Medicine

## 2020-08-10 ENCOUNTER — Inpatient Hospital Stay: Payer: Medicare Other | Admitting: Internal Medicine

## 2020-09-30 ENCOUNTER — Other Ambulatory Visit: Payer: Self-pay

## 2020-09-30 ENCOUNTER — Inpatient Hospital Stay: Payer: Medicare Other | Attending: Internal Medicine

## 2020-09-30 ENCOUNTER — Ambulatory Visit (HOSPITAL_COMMUNITY)
Admission: RE | Admit: 2020-09-30 | Discharge: 2020-09-30 | Disposition: A | Discharge status: Home / Self Care | Payer: Medicare Other | Source: Ambulatory Visit | Attending: Internal Medicine | Admitting: Internal Medicine

## 2020-09-30 ENCOUNTER — Encounter (HOSPITAL_COMMUNITY): Payer: Self-pay

## 2020-09-30 DIAGNOSIS — I700 Atherosclerosis of aorta: Secondary | ICD-10-CM | POA: Insufficient documentation

## 2020-09-30 DIAGNOSIS — J8410 Pulmonary fibrosis, unspecified: Secondary | ICD-10-CM | POA: Diagnosis not present

## 2020-09-30 DIAGNOSIS — Z9221 Personal history of antineoplastic chemotherapy: Secondary | ICD-10-CM | POA: Insufficient documentation

## 2020-09-30 DIAGNOSIS — I2510 Atherosclerotic heart disease of native coronary artery without angina pectoris: Secondary | ICD-10-CM | POA: Diagnosis not present

## 2020-09-30 DIAGNOSIS — Z923 Personal history of irradiation: Secondary | ICD-10-CM | POA: Insufficient documentation

## 2020-09-30 DIAGNOSIS — C3490 Malignant neoplasm of unspecified part of unspecified bronchus or lung: Secondary | ICD-10-CM | POA: Insufficient documentation

## 2020-09-30 DIAGNOSIS — Q2547 Right aortic arch: Secondary | ICD-10-CM | POA: Diagnosis not present

## 2020-09-30 DIAGNOSIS — Z85118 Personal history of other malignant neoplasm of bronchus and lung: Secondary | ICD-10-CM | POA: Insufficient documentation

## 2020-09-30 LAB — CMP (CANCER CENTER ONLY)
ALT: 13 U/L (ref 0–44)
AST: 14 U/L — ABNORMAL LOW (ref 15–41)
Albumin: 3.1 g/dL — ABNORMAL LOW (ref 3.5–5.0)
Alkaline Phosphatase: 82 U/L (ref 38–126)
Anion gap: 4 — ABNORMAL LOW (ref 5–15)
BUN: 9 mg/dL (ref 8–23)
CO2: 27 mmol/L (ref 22–32)
Calcium: 9 mg/dL (ref 8.9–10.3)
Chloride: 102 mmol/L (ref 98–111)
Creatinine: 0.78 mg/dL (ref 0.61–1.24)
GFR, Estimated: >60 mL/min (ref >60)
Glucose, Bld: 88 mg/dL (ref 70–99)
Potassium: 4.3 mmol/L (ref 3.5–5.1)
Sodium: 133 mmol/L — ABNORMAL LOW (ref 135–145)
Total Bilirubin: 0.3 mg/dL (ref 0.3–1.2)
Total Protein: 8.1 g/dL (ref 6.5–8.1)

## 2020-09-30 LAB — CBC WITH DIFFERENTIAL (CANCER CENTER ONLY)
Abs Immature Granulocytes: 0.03 10*3/uL (ref 0.00–0.07)
Basophils Absolute: 0 10*3/uL (ref 0.0–0.1)
Basophils Relative: 1 %
Eosinophils Absolute: 0 10*3/uL (ref 0.0–0.5)
Eosinophils Relative: 1 %
HCT: 37.6 % — ABNORMAL LOW (ref 39.0–52.0)
Hemoglobin: 12.6 g/dL — ABNORMAL LOW (ref 13.0–17.0)
Immature Granulocytes: 1 %
Lymphocytes Relative: 31 %
Lymphs Abs: 1.4 10*3/uL (ref 0.7–4.0)
MCH: 29.6 pg (ref 26.0–34.0)
MCHC: 33.5 g/dL (ref 30.0–36.0)
MCV: 88.5 fL (ref 80.0–100.0)
Monocytes Absolute: 0.6 10*3/uL (ref 0.1–1.0)
Monocytes Relative: 13 %
Neutro Abs: 2.4 10*3/uL (ref 1.7–7.7)
Neutrophils Relative %: 53 %
Platelet Count: 383 10*3/uL (ref 150–400)
RBC: 4.25 MIL/uL (ref 4.22–5.81)
RDW: 12.5 % (ref 11.5–15.5)
WBC Count: 4.4 10*3/uL (ref 4.0–10.5)
nRBC: 0 % (ref 0.0–0.2)

## 2020-09-30 MED ORDER — IOHEXOL 300 MG/ML  SOLN
100.0000 mL | Freq: Once | INTRAMUSCULAR | Status: AC | PRN
  Administered 2020-09-30: 100 mL via INTRAVENOUS

## 2020-10-03 ENCOUNTER — Encounter: Payer: Self-pay | Admitting: *Deleted

## 2020-10-03 ENCOUNTER — Inpatient Hospital Stay (HOSPITAL_BASED_OUTPATIENT_CLINIC_OR_DEPARTMENT_OTHER): Payer: Medicare Other | Admitting: Internal Medicine

## 2020-10-03 ENCOUNTER — Other Ambulatory Visit: Payer: Self-pay

## 2020-10-03 VITALS — BP 116/81 | HR 85 | Temp 98.1°F | Resp 19 | Ht 73.0 in | Wt 126.1 lb

## 2020-10-03 DIAGNOSIS — C3490 Malignant neoplasm of unspecified part of unspecified bronchus or lung: Secondary | ICD-10-CM

## 2020-10-03 DIAGNOSIS — Z85118 Personal history of other malignant neoplasm of bronchus and lung: Secondary | ICD-10-CM | POA: Diagnosis not present

## 2020-10-03 DIAGNOSIS — C3412 Malignant neoplasm of upper lobe, left bronchus or lung: Secondary | ICD-10-CM | POA: Diagnosis not present

## 2020-10-03 DIAGNOSIS — I2510 Atherosclerotic heart disease of native coronary artery without angina pectoris: Secondary | ICD-10-CM | POA: Diagnosis not present

## 2020-10-03 DIAGNOSIS — Z9221 Personal history of antineoplastic chemotherapy: Secondary | ICD-10-CM | POA: Diagnosis not present

## 2020-10-03 DIAGNOSIS — I700 Atherosclerosis of aorta: Secondary | ICD-10-CM | POA: Diagnosis not present

## 2020-10-03 DIAGNOSIS — Z923 Personal history of irradiation: Secondary | ICD-10-CM | POA: Diagnosis not present

## 2020-10-03 NOTE — Progress Notes (Signed)
[place] Telephone:([phone]) [phone]   Fax:(336) [phone]  OFFICE PROGRESS NOTE  Default, Provider, MD No address on file  DIAGNOSIS: Stage IIIA (T2a, N2, M0) non-small cell lung cancer, squamous cell carcinoma diagnosed in February 2017 and presented with left upper lobe obstructing mass as well as questionable periaortic lymphadenopathy.  PRIOR THERAPY:  1) Concurrent chemoradiation with weekly carboplatin for AUC of 2 and paclitaxel 45 MG/M2. First treatment was given on 10/10/2015. Status post 5 weeks of treatment. Last dose was given 11/14/2015 with partial response. 2) Consolidation systemic chemotherapy with carboplatin for AUC of 5 and paclitaxel 175 MG/M2 every 3 weeks with Neulasta support. First dose 01/25/2016. Status post 3 cycles. 3) status post curative radiotherapy to recurrent left lower lobe lung cancer under the care of Dr. Sondra Come completed on August 20, 2018  CURRENT THERAPY: Observation.  INTERVAL HISTORY: Maurice Mcknight 60 y.o. male returns to the clinic today for follow-up visit.  The patient is feeling fine today with no concerning complaints except for weight loss of 2 pounds.  He eats good but he continues to smoke and have some form of drug abuse.  He denied having any current chest pain, shortness of breath, cough or hemoptysis.  He denied having any fever or chills.  He has no nausea, vomiting, diarrhea or constipation.  He has no headache or visual changes.  The patient had repeat CT scan of the chest, abdomen pelvis performed recently and he is here for evaluation and discussion of his discuss results.  MEDICAL HISTORY: Past Medical History:  Diagnosis Date  . Encounter for antineoplastic chemotherapy 10/17/2015  . Fever and chills 04/11/2016  . lung ca dx'd 09/2015  . Malnutrition ([place]) 09/28/2015  . Needs smoking cessation education 09/28/2015  . Radiation 10/17/15-11/25/15   left chest 60 Gy    ALLERGIES:  has No Known  Allergies.  MEDICATIONS:  Current Outpatient Medications  Medication Sig Dispense Refill  . acetaminophen (TYLENOL) 325 MG tablet Take 650 mg by mouth every 6 (six) hours as needed.    Marland Kitchen ibuprofen (ADVIL,MOTRIN) 200 MG tablet Take 800 mg by mouth every 6 (six) hours as needed for moderate pain. Reported on 01/05/2016    . naproxen sodium (ANAPROX) 220 MG tablet Take 220-440 mg by mouth every 12 (twelve) hours as needed (for pain). (Patient not taking: Reported on 07/04/2020)    . ranitidine (ZANTAC) 150 MG capsule Take 1 capsule (150 mg total) by mouth daily. 30 capsule 0   No current facility-administered medications for this visit.    SURGICAL HISTORY:  Past Surgical History:  Procedure Laterality Date  . VIDEO BRONCHOSCOPY Bilateral 09/09/2015   Diagnosis    REVIEW OF SYSTEMS:  A comprehensive review of systems was negative except for: Constitutional: positive for fatigue and weight loss   PHYSICAL EXAMINATION: General appearance: alert, cooperative, fatigued and no distress Head: Normocephalic, without obvious abnormality, atraumatic Neck: no adenopathy, no JVD, supple, symmetrical, trachea midline and thyroid not enlarged, symmetric, no tenderness/mass/nodules Lymph nodes: Cervical, supraclavicular, and axillary nodes normal. Resp: clear to auscultation bilaterally Back: symmetric, no curvature. ROM normal. No CVA tenderness. Cardio: regular rate and rhythm, S1, S2 normal, no murmur, click, rub or gallop GI: soft, non-tender; bowel sounds normal; no masses,  no organomegaly Extremities: extremities normal, atraumatic, no cyanosis or edema  ECOG PERFORMANCE STATUS: 1 - Symptomatic but completely ambulatory  Blood pressure 116/81, pulse 85, temperature 98.1 F (36.7 C), temperature source Tympanic, resp. rate  19, height 6\' 1"  (1.854 m), weight 126 lb 1.6 oz (57.2 kg), SpO2 100 %.  LABORATORY DATA: Lab Results  Component Value Date   WBC 4.4 09/30/2020   HGB 12.6 (L)  09/30/2020   HCT 37.6 (L) 09/30/2020   MCV 88.5 09/30/2020   PLT 383 09/30/2020      Chemistry      Component Value Date/Time   NA 133 (L) 09/30/2020 0927   NA 141 01/21/2017 0753   K 4.3 09/30/2020 0927   K 4.2 01/21/2017 0753   CL 102 09/30/2020 0927   CO2 27 09/30/2020 0927   CO2 26 01/21/2017 0753   BUN 9 09/30/2020 0927   BUN 10.1 01/21/2017 0753   CREATININE 0.78 09/30/2020 0927   CREATININE 1.0 01/21/2017 0753      Component Value Date/Time   CALCIUM 9.0 09/30/2020 0927   CALCIUM 8.8 01/21/2017 0753   ALKPHOS 82 09/30/2020 0927   ALKPHOS 130 01/21/2017 0753   AST 14 (L) 09/30/2020 0927   AST 19 01/21/2017 0753   ALT 13 09/30/2020 0927   ALT 11 01/21/2017 0753   BILITOT 0.3 09/30/2020 0927   BILITOT 0.24 01/21/2017 0753       RADIOGRAPHIC STUDIES: CT Chest W Contrast  Result Date: 09/30/2020 CLINICAL DATA:  Primary Cancer Type: Lung Imaging Indication: Routine surveillance Interval therapy since last imaging? No Initial Cancer Diagnosis Date: 09/09/2015; Established by: Biopsy-proven Detailed Pathology: Stage IIIA non-small cell lung cancer, squamous cell carcinoma. Primary Tumor location: Left upper lobe. Recurrence?  Yes; Date(s) of recurrence: 06/23/2018 Established by: Imaging only; Left lower lobe. Surgeries: No. Chemotherapy: Yes; Ongoing?  No; Most recent administration: 2017 Immunotherapy? No Radiation therapy? Yes Date Range: 08/07/2018 - 08/20/2018; Target: Left lung Date Range: 10/17/2015 - 11/25/2015; Target: Left lung EXAM: CT CHEST, ABDOMEN, AND PELVIS WITH CONTRAST TECHNIQUE: Multidetector CT imaging of the chest, abdomen and pelvis was performed following the standard protocol during bolus administration of intravenous contrast. CONTRAST:  170mL OMNIPAQUE IOHEXOL 300 MG/ML  SOLN COMPARISON:  Most recent CT chest 06/30/2020.  06/23/2018 PET-CT. 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 Normal heart size. Scattered left coronary artery calcifications no pericardial effusion. Mediastinum/Nodes: Unchanged post treatment appearance of soft tissue about the left hilum. No discretely enlarged mediastinal, hilar, or axillary lymph nodes. Thyroid gland, trachea, and esophagus demonstrate no significant findings. Lungs/Pleura: Unchanged post treatment appearance of the left chest with dense fibrotic consolidation and volume loss of the posterior perihilar left lung and treated mass in this vicinity measures approximately 3.4 x 1.6 cm, not significantly changed (series 4, image 69). Musculoskeletal: No chest wall mass or suspicious bone lesions identified. CT ABDOMEN PELVIS FINDINGS Hepatobiliary: No solid liver abnormality is seen. No gallstones, gallbladder wall thickening, or biliary dilatation. Pancreas: Unremarkable. No pancreatic ductal dilatation or surrounding inflammatory changes. Spleen: Normal in size without significant abnormality. Adrenals/Urinary Tract: Adrenal glands are unremarkable. Kidneys are normal, without renal calculi, solid lesion, or hydronephrosis. Bladder is unremarkable. Stomach/Bowel: Stomach is within normal limits. Appendix appears normal. No evidence of bowel wall thickening, distention, or inflammatory changes. Vascular/Lymphatic: Aortic atherosclerosis. No enlarged abdominal or pelvic lymph nodes. Reproductive: No mass or other abnormality. Other: No abdominal wall hernia or abnormality. No abdominopelvic ascites. Musculoskeletal: No acute or significant osseous findings. IMPRESSION: 1. Unchanged post treatment appearance of the left chest with dense fibrotic consolidation and volume loss of the posterior perihilar left lung and treated, previously  FDG avid mass in this vicinity. 2. Unchanged post treatment appearance of soft tissue about the left hilum. No discretely enlarged mediastinal, hilar, or axillary lymph nodes. 3. No evidence  of metastatic disease in the abdomen or pelvis. 4. Coronary artery disease. Aortic Atherosclerosis (ICD10-I70.0). Electronically Signed   By: Eddie Candle M.D.   On: 09/30/2020 11:14   CT Abdomen Pelvis W Contrast  Result Date: 09/30/2020 CLINICAL DATA:  Primary Cancer Type: Lung Imaging Indication: Routine surveillance Interval therapy since last imaging? No Initial Cancer Diagnosis Date: 09/09/2015; Established by: Biopsy-proven Detailed Pathology: Stage IIIA non-small cell lung cancer, squamous cell carcinoma. Primary Tumor location: Left upper lobe. Recurrence?  Yes; Date(s) of recurrence: 06/23/2018 Established by: Imaging only; Left lower lobe. Surgeries: No. Chemotherapy: Yes; Ongoing?  No; Most recent administration: 2017 Immunotherapy? No Radiation therapy? Yes Date Range: 08/07/2018 - 08/20/2018; Target: Left lung Date Range: 10/17/2015 - 11/25/2015; Target: Left lung EXAM: CT CHEST, ABDOMEN, AND PELVIS WITH CONTRAST TECHNIQUE: Multidetector CT imaging of the chest, abdomen and pelvis was performed following the standard protocol during bolus administration of intravenous contrast. CONTRAST:  158mL OMNIPAQUE IOHEXOL 300 MG/ML  SOLN COMPARISON:  Most recent CT chest 06/30/2020.  06/23/2018 PET-CT. 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 Normal heart size. Scattered left coronary artery calcifications no pericardial effusion. Mediastinum/Nodes: Unchanged post treatment appearance of soft tissue about the left hilum. No discretely enlarged mediastinal, hilar, or axillary lymph nodes. Thyroid gland, trachea, and esophagus demonstrate no significant findings. Lungs/Pleura: Unchanged post treatment appearance of the left chest with dense fibrotic consolidation and volume loss of the posterior perihilar left lung and treated mass in this vicinity measures approximately 3.4 x 1.6 cm, not significantly  changed (series 4, image 69). Musculoskeletal: No chest wall mass or suspicious bone lesions identified. CT ABDOMEN PELVIS FINDINGS Hepatobiliary: No solid liver abnormality is seen. No gallstones, gallbladder wall thickening, or biliary dilatation. Pancreas: Unremarkable. No pancreatic ductal dilatation or surrounding inflammatory changes. Spleen: Normal in size without significant abnormality. Adrenals/Urinary Tract: Adrenal glands are unremarkable. Kidneys are normal, without renal calculi, solid lesion, or hydronephrosis. Bladder is unremarkable. Stomach/Bowel: Stomach is within normal limits. Appendix appears normal. No evidence of bowel wall thickening, distention, or inflammatory changes. Vascular/Lymphatic: Aortic atherosclerosis. No enlarged abdominal or pelvic lymph nodes. Reproductive: No mass or other abnormality. Other: No abdominal wall hernia or abnormality. No abdominopelvic ascites. Musculoskeletal: No acute or significant osseous findings. IMPRESSION: 1. Unchanged post treatment appearance of the left chest with dense fibrotic consolidation and volume loss of the posterior perihilar left lung and treated, previously FDG avid mass in this vicinity. 2. Unchanged post treatment appearance of soft tissue about the left hilum. No discretely enlarged mediastinal, hilar, or axillary lymph nodes. 3. No evidence of metastatic disease in the abdomen or pelvis. 4. Coronary artery disease. Aortic Atherosclerosis (ICD10-I70.0). Electronically Signed   By: Eddie Candle M.D.   On: 09/30/2020 11:14    ASSESSMENT AND PLAN:  This is a very pleasant 60 years old African-American male with recurrent non-small cell lung cancer initially diagnosed as a stage IIIA non-small cell lung cancer, squamous cell carcinoma presented with large obstructing left upper lobe lung mass in addition to mediastinal lymphadenopathy diagnosed in February 2017 status post concurrent chemoradiation followed by consolidation  chemotherapy. The patient has been in observation since 2017.  He had recurrent disease with new left lower lobe lung nodule in December 2019. The patient underwent  curative SBRT to this nodule under the care of Dr. Sondra Come and he tolerated it fairly well. The patient is current on observation and he is feeling fine today with no concerning complaints. He had repeat CT scan of the chest, abdomen pelvis performed recently.  I personally and independently reviewed the scans and discussed the results with the patient today. His scan showed no concerning findings for disease recurrence or metastasis. I recommended for the patient to continue on observation with repeat CT scan of the chest in 6 months. The patient was advised to call immediately if he has any concerning symptoms in the interval. The patient voices understanding of current disease status and treatment options and is in agreement with the current care plan. All questions were answered. The patient knows to call the clinic with any problems, questions or concerns. We can certainly see the patient much sooner if necessary.  Disclaimer: This note was dictated with voice recognition software. Similar sounding words can inadvertently be transcribed and may not be corrected upon review.

## 2020-10-03 NOTE — Progress Notes (Signed)
Spoke with Maurice Mcknight today.  He is doing well on treatment and understands plan of care.  I encouraged him to stop smoking.  No other barriers identified at this time.

## 2021-04-03 ENCOUNTER — Other Ambulatory Visit: Payer: Self-pay

## 2021-04-03 ENCOUNTER — Encounter (HOSPITAL_COMMUNITY): Payer: Self-pay

## 2021-04-03 ENCOUNTER — Inpatient Hospital Stay: Payer: Medicare Other | Attending: Internal Medicine

## 2021-04-03 ENCOUNTER — Ambulatory Visit (HOSPITAL_COMMUNITY)
Admission: RE | Admit: 2021-04-03 | Discharge: 2021-04-03 | Disposition: A | Discharge status: Home / Self Care | Payer: Medicare Other | Source: Ambulatory Visit | Attending: Internal Medicine | Admitting: Internal Medicine

## 2021-04-03 DIAGNOSIS — I700 Atherosclerosis of aorta: Secondary | ICD-10-CM | POA: Diagnosis not present

## 2021-04-03 DIAGNOSIS — Z923 Personal history of irradiation: Secondary | ICD-10-CM | POA: Insufficient documentation

## 2021-04-03 DIAGNOSIS — I2510 Atherosclerotic heart disease of native coronary artery without angina pectoris: Secondary | ICD-10-CM | POA: Insufficient documentation

## 2021-04-03 DIAGNOSIS — F17210 Nicotine dependence, cigarettes, uncomplicated: Secondary | ICD-10-CM | POA: Insufficient documentation

## 2021-04-03 DIAGNOSIS — C3412 Malignant neoplasm of upper lobe, left bronchus or lung: Secondary | ICD-10-CM | POA: Insufficient documentation

## 2021-04-03 DIAGNOSIS — Q2547 Right aortic arch: Secondary | ICD-10-CM | POA: Insufficient documentation

## 2021-04-03 DIAGNOSIS — C3490 Malignant neoplasm of unspecified part of unspecified bronchus or lung: Secondary | ICD-10-CM

## 2021-04-03 DIAGNOSIS — C7802 Secondary malignant neoplasm of left lung: Secondary | ICD-10-CM | POA: Insufficient documentation

## 2021-04-03 DIAGNOSIS — Z9221 Personal history of antineoplastic chemotherapy: Secondary | ICD-10-CM | POA: Insufficient documentation

## 2021-04-03 DIAGNOSIS — R590 Localized enlarged lymph nodes: Secondary | ICD-10-CM | POA: Insufficient documentation

## 2021-04-03 LAB — CMP (CANCER CENTER ONLY)
ALT: 23 U/L (ref 0–44)
AST: 35 U/L (ref 15–41)
Albumin: 3.6 g/dL (ref 3.5–5.0)
Alkaline Phosphatase: 107 U/L (ref 38–126)
Anion gap: 9 (ref 5–15)
BUN: 10 mg/dL (ref 8–23)
CO2: 26 mmol/L (ref 22–32)
Calcium: 9.5 mg/dL (ref 8.9–10.3)
Chloride: 97 mmol/L — ABNORMAL LOW (ref 98–111)
Creatinine: 0.95 mg/dL (ref 0.61–1.24)
GFR, Estimated: >60 mL/min (ref >60)
Glucose, Bld: 87 mg/dL (ref 70–99)
Potassium: 4.6 mmol/L (ref 3.5–5.1)
Sodium: 132 mmol/L — ABNORMAL LOW (ref 135–145)
Total Bilirubin: 0.7 mg/dL (ref 0.3–1.2)
Total Protein: 8.5 g/dL — ABNORMAL HIGH (ref 6.5–8.1)

## 2021-04-03 LAB — CBC WITH DIFFERENTIAL (CANCER CENTER ONLY)
Abs Immature Granulocytes: 0.01 10*3/uL (ref 0.00–0.07)
Basophils Absolute: 0 10*3/uL (ref 0.0–0.1)
Basophils Relative: 1 %
Eosinophils Absolute: 0.1 10*3/uL (ref 0.0–0.5)
Eosinophils Relative: 2 %
HCT: 44.6 % (ref 39.0–52.0)
Hemoglobin: 15.1 g/dL (ref 13.0–17.0)
Immature Granulocytes: 0 %
Lymphocytes Relative: 41 %
Lymphs Abs: 1.6 10*3/uL (ref 0.7–4.0)
MCH: 30.7 pg (ref 26.0–34.0)
MCHC: 33.9 g/dL (ref 30.0–36.0)
MCV: 90.7 fL (ref 80.0–100.0)
Monocytes Absolute: 0.4 10*3/uL (ref 0.1–1.0)
Monocytes Relative: 11 %
Neutro Abs: 1.8 10*3/uL (ref 1.7–7.7)
Neutrophils Relative %: 45 %
Platelet Count: 218 10*3/uL (ref 150–400)
RBC: 4.92 MIL/uL (ref 4.22–5.81)
RDW: 12.7 % (ref 11.5–15.5)
WBC Count: 3.9 10*3/uL — ABNORMAL LOW (ref 4.0–10.5)
nRBC: 0 % (ref 0.0–0.2)

## 2021-04-03 MED ORDER — IOHEXOL 350 MG/ML SOLN
60.0000 mL | Freq: Once | INTRAVENOUS | Status: AC | PRN
  Administered 2021-04-03: 60 mL via INTRAVENOUS

## 2021-04-04 ENCOUNTER — Inpatient Hospital Stay (HOSPITAL_BASED_OUTPATIENT_CLINIC_OR_DEPARTMENT_OTHER): Payer: Medicare Other | Admitting: Internal Medicine

## 2021-04-04 ENCOUNTER — Telehealth: Payer: Self-pay | Admitting: Internal Medicine

## 2021-04-04 ENCOUNTER — Encounter: Payer: Self-pay | Admitting: *Deleted

## 2021-04-04 VITALS — BP 128/92 | HR 103 | Temp 96.1°F | Resp 18 | Wt 126.0 lb

## 2021-04-04 DIAGNOSIS — Z923 Personal history of irradiation: Secondary | ICD-10-CM | POA: Diagnosis not present

## 2021-04-04 DIAGNOSIS — C3490 Malignant neoplasm of unspecified part of unspecified bronchus or lung: Secondary | ICD-10-CM | POA: Diagnosis not present

## 2021-04-04 DIAGNOSIS — C3412 Malignant neoplasm of upper lobe, left bronchus or lung: Secondary | ICD-10-CM | POA: Diagnosis not present

## 2021-04-04 DIAGNOSIS — Z9221 Personal history of antineoplastic chemotherapy: Secondary | ICD-10-CM | POA: Diagnosis not present

## 2021-04-04 DIAGNOSIS — C7802 Secondary malignant neoplasm of left lung: Secondary | ICD-10-CM | POA: Diagnosis not present

## 2021-04-04 DIAGNOSIS — R590 Localized enlarged lymph nodes: Secondary | ICD-10-CM | POA: Diagnosis not present

## 2021-04-04 DIAGNOSIS — I700 Atherosclerosis of aorta: Secondary | ICD-10-CM | POA: Diagnosis not present

## 2021-04-04 DIAGNOSIS — F17210 Nicotine dependence, cigarettes, uncomplicated: Secondary | ICD-10-CM | POA: Diagnosis not present

## 2021-04-04 DIAGNOSIS — Q2547 Right aortic arch: Secondary | ICD-10-CM | POA: Diagnosis not present

## 2021-04-04 DIAGNOSIS — I2510 Atherosclerotic heart disease of native coronary artery without angina pectoris: Secondary | ICD-10-CM | POA: Diagnosis not present

## 2021-04-04 NOTE — Telephone Encounter (Signed)
Scheduled appt per 9/20 los - mailed letter with appt date and time

## 2021-04-04 NOTE — Progress Notes (Signed)
I was able to see Maurice Mcknight today at his visit with [name]. He is doing well without complaints.  He verbalized understanding of current treatment plan.  No barriers identified at this time.

## 2021-04-04 NOTE — Progress Notes (Signed)
[place] [phone]   Fax:([phone]) [place], MD [place] [address]  DIAGNOSIS: Stage IIIA (T2a, N2, M0) non-small cell lung cancer, squamous cell carcinoma diagnosed in February 2017 and presented with left upper lobe obstructing mass as well as questionable periaortic lymphadenopathy.  PRIOR THERAPY:  1) Concurrent chemoradiation with weekly carboplatin for AUC of 2 and paclitaxel 45 MG/M2. First treatment was given on 10/10/2015. Status post 5 weeks of treatment. Last dose was given 11/14/2015 with partial response. 2) Consolidation systemic chemotherapy with carboplatin for AUC of 5 and paclitaxel 175 MG/M2 every 3 weeks with Neulasta support. First dose 01/25/2016. Status post 3 cycles. 3) status post curative radiotherapy to recurrent left lower lobe lung cancer under the care of Dr. Sondra Come completed on August 20, 2018  CURRENT THERAPY: Observation.  INTERVAL HISTORY: Maurice Mcknight 60 y.o. male returns to the clinic today for follow-up visit.  The patient is feeling fine today with no concerning complaints.  He denied having any current chest pain, shortness of breath, cough or hemoptysis.  He denied having any fever or chills.  He has no nausea, vomiting, diarrhea or constipation.  He has no headache or visual changes.  He denied having any recent weight loss or night sweats.  He had repeat CT scan of the chest performed recently and he is here for evaluation and discussion of his scan results.  MEDICAL HISTORY: Past Medical History:  Diagnosis Date   Encounter for antineoplastic chemotherapy 10/17/2015   Fever and chills 04/11/2016   lung ca dx'd 09/2015   Malnutrition ([name]) 09/28/2015   Needs smoking cessation education 09/28/2015   Radiation 10/17/15-11/25/15   left chest 60 Gy    ALLERGIES:  has No Known Allergies.  MEDICATIONS:  Current Outpatient Medications  Medication Sig  Dispense Refill   acetaminophen (TYLENOL) 325 MG tablet Take 650 mg by mouth every 6 (six) hours as needed.     ibuprofen (ADVIL,MOTRIN) 200 MG tablet Take 800 mg by mouth every 6 (six) hours as needed for moderate pain. Reported on 01/05/2016     naproxen sodium (ANAPROX) 220 MG tablet Take 220-440 mg by mouth every 12 (twelve) hours as needed (for pain). (Patient not taking: Reported on 07/04/2020)     ranitidine (ZANTAC) 150 MG capsule Take 1 capsule (150 mg total) by mouth daily. 30 capsule 0   No current facility-administered medications for this visit.    SURGICAL HISTORY:  Past Surgical History:  Procedure Laterality Date   VIDEO BRONCHOSCOPY Bilateral 09/09/2015   Diagnosis    REVIEW OF SYSTEMS:  A comprehensive review of systems was negative.   PHYSICAL EXAMINATION: General appearance: alert, cooperative, fatigued and no distress Head: Normocephalic, without obvious abnormality, atraumatic Neck: no adenopathy, no JVD, supple, symmetrical, trachea midline and thyroid not enlarged, symmetric, no tenderness/mass/nodules Lymph nodes: Cervical, supraclavicular, and axillary nodes normal. Resp: clear to auscultation bilaterally Back: symmetric, no curvature. ROM normal. No CVA tenderness. Cardio: regular rate and rhythm, S1, S2 normal, no murmur, click, rub or gallop GI: soft, non-tender; bowel sounds normal; no masses,  no organomegaly Extremities: extremities normal, atraumatic, no cyanosis or edema  ECOG PERFORMANCE STATUS: 1 - Symptomatic but completely ambulatory  Blood pressure (!) 128/92, pulse (!) 103, temperature (!) 96.1 F (35.6 C), temperature source Tympanic, resp. rate 18, weight 126 lb (57.2 kg), SpO2 99 %.  LABORATORY DATA: Lab Results  Component Value Date  WBC 3.9 (L) 04/03/2021   HGB 15.1 04/03/2021   HCT 44.6 04/03/2021   MCV 90.7 04/03/2021   PLT 218 04/03/2021      Chemistry      Component Value Date/Time   NA 132 (L) 04/03/2021 1058   NA 141  01/21/2017 0753   K 4.6 04/03/2021 1058   K 4.2 01/21/2017 0753   CL 97 (L) 04/03/2021 1058   CO2 26 04/03/2021 1058   CO2 26 01/21/2017 0753   BUN 10 04/03/2021 1058   BUN 10.1 01/21/2017 0753   CREATININE 0.95 04/03/2021 1058   CREATININE 1.0 01/21/2017 0753      Component Value Date/Time   CALCIUM 9.5 04/03/2021 1058   CALCIUM 8.8 01/21/2017 0753   ALKPHOS 107 04/03/2021 1058   ALKPHOS 130 01/21/2017 0753   AST 35 04/03/2021 1058   AST 19 01/21/2017 0753   ALT 23 04/03/2021 1058   ALT 11 01/21/2017 0753   BILITOT 0.7 04/03/2021 1058   BILITOT 0.24 01/21/2017 0753       RADIOGRAPHIC STUDIES: CT Chest W Contrast  Result Date: 04/03/2021 CLINICAL DATA:  60 year old male with history of non-small cell lung cancer status post chemotherapy and radiation therapy. Staging examination. EXAM: CT CHEST WITH CONTRAST TECHNIQUE: Multidetector CT imaging of the chest was performed during intravenous contrast administration. CONTRAST:  36mL OMNIPAQUE IOHEXOL 350 MG/ML SOLN COMPARISON:  Chest CT 09/30/2020. FINDINGS: Cardiovascular: Heart size is normal. There is no significant pericardial fluid, thickening or pericardial calcification. There is aortic atherosclerosis, as well as atherosclerosis of the great vessels of the mediastinum and the coronary arteries, including calcified atherosclerotic plaque in the left main and left anterior descending coronary arteries. 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 Mediastinum/Nodes: No pathologically enlarged mediastinal or hilar lymph nodes. Esophagus is unremarkable in appearance. No axillary lymphadenopathy. Lungs/Pleura: There continues to be chronic areas of mass-like architectural distortion and volume loss in the left lung, most evident in the anterior aspect of the left upper lobe and in the perihilar aspect of both the left upper and lower lobes, compatible with chronic postradiation  mass-like fibrosis. No definite new suspicious appearing pulmonary nodules or masses are noted. No acute consolidative airspace disease. No pleural effusions. Upper Abdomen: Aortic atherosclerosis. Musculoskeletal: There are no aggressive appearing lytic or blastic lesions noted in the visualized portions of the skeleton. IMPRESSION: 1. 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 2. Aortic atherosclerosis, in addition to left main and left anterior descending coronary artery disease. Please note that although the presence of coronary artery calcium documents the presence of coronary artery disease, the severity of this disease and any potential stenosis cannot be assessed on this non-gated CT examination. Assessment for potential risk factor modification, dietary therapy or pharmacologic therapy may be warranted, if clinically indicated. Electronically Signed   By: Vinnie Langton M.D.   On: 04/03/2021 16:51     ASSESSMENT AND PLAN:  This is a very pleasant 60 years old African-American male with recurrent non-small cell lung cancer initially diagnosed as a stage IIIA non-small cell lung cancer, squamous cell carcinoma presented with large obstructing left upper lobe lung mass in addition to mediastinal lymphadenopathy diagnosed in February 2017 status post concurrent chemoradiation followed by consolidation chemotherapy. The patient has been in observation since 2017.  He had recurrent disease  with new left lower lobe lung nodule in December 2019. The patient underwent curative SBRT to this nodule under the care of Dr. Sondra Come and he tolerated it fairly well. The patient is currently on observation and he is feeling fine today with no concerning complaints. He had repeat CT scan of the chest performed recently.  I personally and  independently reviewed the scans and discussed the results with the patient today. His scan showed no concerning findings for disease recurrence or metastasis. I recommended for the patient to continue on observation with repeat CT scan of the chest in 6 months. He was advised to call immediately if he has any other concerning symptoms in the interval. The patient voices understanding of current disease status and treatment options and is in agreement with the current care plan. All questions were answered. The patient knows to call the clinic with any problems, questions or concerns. We can certainly see the patient much sooner if necessary.  Disclaimer: This note was dictated with voice recognition software. Similar sounding words can inadvertently be transcribed and may not be corrected upon review.

## 2021-09-14 ENCOUNTER — Emergency Department (HOSPITAL_COMMUNITY)
Admission: EM | Admit: 2021-09-14 | Discharge: 2021-10-14 | Disposition: E | Payer: Medicare Other | Attending: Emergency Medicine | Admitting: Emergency Medicine

## 2021-09-14 DIAGNOSIS — I469 Cardiac arrest, cause unspecified: Secondary | ICD-10-CM | POA: Insufficient documentation

## 2021-09-14 DIAGNOSIS — S21109A Unspecified open wound of unspecified front wall of thorax without penetration into thoracic cavity, initial encounter: Secondary | ICD-10-CM | POA: Insufficient documentation

## 2021-09-14 DIAGNOSIS — W3400XA Accidental discharge from unspecified firearms or gun, initial encounter: Secondary | ICD-10-CM | POA: Insufficient documentation

## 2021-09-14 DIAGNOSIS — R000 Tachycardia, unspecified: Secondary | ICD-10-CM | POA: Diagnosis not present

## 2021-09-14 DIAGNOSIS — R0689 Other abnormalities of breathing: Secondary | ICD-10-CM | POA: Diagnosis not present

## 2021-09-14 DIAGNOSIS — R404 Transient alteration of awareness: Secondary | ICD-10-CM | POA: Diagnosis not present

## 2021-09-14 DIAGNOSIS — I499 Cardiac arrhythmia, unspecified: Secondary | ICD-10-CM | POA: Diagnosis not present

## 2021-09-14 MED ORDER — EPINEPHRINE 1 MG/10ML IJ SOSY
PREFILLED_SYRINGE | INTRAMUSCULAR | Status: AC | PRN
  Administered 2021-09-14 (×3): 1 mg via INTRAVENOUS
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
## 2021-09-14 NOTE — ED Notes (Signed)
[employer] officers at bedside .  ?
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
## 2021-09-14 NOTE — Progress Notes (Signed)
Orthopedic Tech Progress Note ?Patient Details:  ?Jervon Ream Mcleary ?01-10-1961 ?[id number] ? ?Patient ID: NORWOOD QUEZADA, male   DOB: Jun 21, 1961, 61 y.o.   MRN: [id number] ?Level I; not needed. ? ?[place] ?09/30/2021, 10:50 PM ? ?

## 2021-09-14 NOTE — ED Notes (Addendum)
Time oif Death : 2251 declared by Dr. Langston Masker.  ?
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
## 2021-09-14 NOTE — ED Notes (Signed)
No palpable pulse , CPR continues.  ?

## 2021-09-14 NOTE — ED Notes (Signed)
Pulse check: VFib , defibrillated. CPR continues.  ?

## 2021-09-14 NOTE — ED Notes (Signed)
Post mortem care rendered . [facility] notified on patient's death by Trauma RN .  ?

## 2021-09-29 ENCOUNTER — Inpatient Hospital Stay: Payer: Medicare Other

## 2021-10-02 ENCOUNTER — Inpatient Hospital Stay: Payer: Medicare Other | Admitting: Internal Medicine

## 2021-10-14 NOTE — ED Provider Notes (Signed)
?  [place] ?Provider Note ?Procedures ?Procedure Name: Intubation ?Date/Time: Oct 05, 2021 2:57 PM ?Performed by: Jacelyn Pi, MD ?Pre-anesthesia Checklist: Patient identified ?Preoxygenation: Pre-oxygenation with 100% oxygen ?Ventilation: Mask ventilation without difficulty ?Laryngoscope Size: Glidescope ?Grade View: Grade I ?Tube size: 7.5 mm ?Number of attempts: 1 ?Airway Equipment and Method: Stylet ?Placement Confirmation: ETT inserted through vocal cords under direct vision, Positive ETCO2, CO2 detector and Breath sounds checked- equal and bilateral ?Secured at: 23 cm ?Dental Injury: Teeth and Oropharynx as per pre-operative assessment  ?Comments: Patient arrived with [facility] airway. This was removed and patient was intubated as per above.  ? ? ?  ?  ?Jacelyn Pi, MD ?2021/10/05 1458 ? ?  ?Wyvonnia Dusky, MD ?Oct 05, 2021 1556 ? ?
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
## 2021-10-14 DEATH — deceased
